# Patient Record
Sex: Female | Born: 1940 | Race: White | Hispanic: No | Smoking: Former smoker
Health system: Southern US, Community
[De-identification: ages and names within clinical notes are randomized; demographics above are authoritative.]

## PROBLEM LIST (undated history)

## (undated) DIAGNOSIS — K5790 Diverticulosis of intestine, part unspecified, without perforation or abscess without bleeding: Secondary | ICD-10-CM

## (undated) DIAGNOSIS — M1990 Unspecified osteoarthritis, unspecified site: Secondary | ICD-10-CM

## (undated) DIAGNOSIS — K449 Diaphragmatic hernia without obstruction or gangrene: Secondary | ICD-10-CM

## (undated) DIAGNOSIS — E119 Type 2 diabetes mellitus without complications: Secondary | ICD-10-CM

## (undated) DIAGNOSIS — E079 Disorder of thyroid, unspecified: Secondary | ICD-10-CM

## (undated) DIAGNOSIS — I2510 Atherosclerotic heart disease of native coronary artery without angina pectoris: Secondary | ICD-10-CM

## (undated) DIAGNOSIS — Z9289 Personal history of other medical treatment: Secondary | ICD-10-CM

## (undated) DIAGNOSIS — I4891 Unspecified atrial fibrillation: Secondary | ICD-10-CM

## (undated) DIAGNOSIS — R0602 Shortness of breath: Secondary | ICD-10-CM

## (undated) DIAGNOSIS — I509 Heart failure, unspecified: Secondary | ICD-10-CM

## (undated) DIAGNOSIS — I249 Acute ischemic heart disease, unspecified: Secondary | ICD-10-CM

## (undated) DIAGNOSIS — I10 Essential (primary) hypertension: Secondary | ICD-10-CM

## (undated) DIAGNOSIS — E7800 Pure hypercholesterolemia, unspecified: Secondary | ICD-10-CM

## (undated) DIAGNOSIS — F419 Anxiety disorder, unspecified: Secondary | ICD-10-CM

## (undated) DIAGNOSIS — E039 Hypothyroidism, unspecified: Secondary | ICD-10-CM

## (undated) DIAGNOSIS — D649 Anemia, unspecified: Secondary | ICD-10-CM

## (undated) HISTORY — DX: Acute ischemic heart disease, unspecified: I24.9

## (undated) HISTORY — PX: APPENDECTOMY: SHX54

## (undated) HISTORY — PX: ABDOMINAL HYSTERECTOMY: SHX81

## (undated) HISTORY — DX: Pure hypercholesterolemia, unspecified: E78.00

## (undated) HISTORY — DX: Diverticulosis of intestine, part unspecified, without perforation or abscess without bleeding: K57.90

## (undated) HISTORY — PX: LAPAROSCOPIC CHOLECYSTECTOMY: SUR755

## (undated) HISTORY — DX: Diaphragmatic hernia without obstruction or gangrene: K44.9

---

## 1998-02-17 ENCOUNTER — Emergency Department (HOSPITAL_COMMUNITY): Admission: EM | Admit: 1998-02-17 | Discharge: 1998-02-17 | Payer: Self-pay | Admitting: Emergency Medicine

## 1999-05-12 ENCOUNTER — Inpatient Hospital Stay (HOSPITAL_COMMUNITY): Admission: AD | Admit: 1999-05-12 | Discharge: 1999-05-13 | Payer: Self-pay | Admitting: Cardiovascular Disease

## 1999-08-12 ENCOUNTER — Ambulatory Visit (HOSPITAL_COMMUNITY): Admission: RE | Admit: 1999-08-12 | Discharge: 1999-08-12 | Payer: Self-pay | Admitting: Gastroenterology

## 1999-08-29 ENCOUNTER — Encounter: Payer: Self-pay | Admitting: Gastroenterology

## 1999-08-29 ENCOUNTER — Ambulatory Visit (HOSPITAL_COMMUNITY): Admission: RE | Admit: 1999-08-29 | Discharge: 1999-08-29 | Payer: Self-pay | Admitting: Gastroenterology

## 1999-09-05 ENCOUNTER — Encounter: Payer: Self-pay | Admitting: Gastroenterology

## 1999-09-05 ENCOUNTER — Ambulatory Visit (HOSPITAL_COMMUNITY): Admission: RE | Admit: 1999-09-05 | Discharge: 1999-09-05 | Payer: Self-pay | Admitting: Gastroenterology

## 1999-09-27 ENCOUNTER — Other Ambulatory Visit: Admission: RE | Admit: 1999-09-27 | Discharge: 1999-09-27 | Payer: Self-pay | Admitting: *Deleted

## 2000-02-13 ENCOUNTER — Ambulatory Visit (HOSPITAL_COMMUNITY): Admission: RE | Admit: 2000-02-13 | Discharge: 2000-02-13 | Payer: Self-pay | Admitting: Cardiovascular Disease

## 2000-02-13 ENCOUNTER — Encounter: Payer: Self-pay | Admitting: Cardiovascular Disease

## 2000-02-24 ENCOUNTER — Ambulatory Visit (HOSPITAL_COMMUNITY): Admission: RE | Admit: 2000-02-24 | Discharge: 2000-02-24 | Payer: Self-pay | Admitting: Cardiovascular Disease

## 2000-09-28 ENCOUNTER — Other Ambulatory Visit: Admission: RE | Admit: 2000-09-28 | Discharge: 2000-09-28 | Payer: Self-pay | Admitting: *Deleted

## 2003-08-01 ENCOUNTER — Emergency Department (HOSPITAL_COMMUNITY): Admission: EM | Admit: 2003-08-01 | Discharge: 2003-08-01 | Payer: Self-pay

## 2003-08-01 ENCOUNTER — Encounter: Payer: Self-pay | Admitting: Emergency Medicine

## 2004-10-17 ENCOUNTER — Ambulatory Visit: Payer: Self-pay | Admitting: Adult Health

## 2004-11-20 ENCOUNTER — Encounter: Admission: RE | Admit: 2004-11-20 | Discharge: 2004-11-20 | Payer: Self-pay | Admitting: Cardiovascular Disease

## 2005-04-24 ENCOUNTER — Ambulatory Visit: Payer: Self-pay | Admitting: Pulmonary Disease

## 2005-05-07 ENCOUNTER — Encounter: Admission: RE | Admit: 2005-05-07 | Discharge: 2005-05-07 | Payer: Self-pay | Admitting: Cardiovascular Disease

## 2005-05-21 ENCOUNTER — Ambulatory Visit: Payer: Self-pay | Admitting: Pulmonary Disease

## 2005-05-22 ENCOUNTER — Ambulatory Visit: Payer: Self-pay | Admitting: Internal Medicine

## 2005-06-18 ENCOUNTER — Ambulatory Visit: Payer: Self-pay | Admitting: Pulmonary Disease

## 2005-07-03 ENCOUNTER — Ambulatory Visit (HOSPITAL_COMMUNITY): Admission: RE | Admit: 2005-07-03 | Discharge: 2005-07-03 | Payer: Self-pay | Admitting: Pulmonary Disease

## 2005-07-16 ENCOUNTER — Inpatient Hospital Stay (HOSPITAL_COMMUNITY): Admission: AD | Admit: 2005-07-16 | Discharge: 2005-07-24 | Payer: Self-pay | Admitting: Cardiovascular Disease

## 2005-07-17 ENCOUNTER — Encounter (INDEPENDENT_AMBULATORY_CARE_PROVIDER_SITE_OTHER): Payer: Self-pay | Admitting: Cardiovascular Disease

## 2005-07-21 ENCOUNTER — Ambulatory Visit: Payer: Self-pay | Admitting: Gastroenterology

## 2005-07-22 ENCOUNTER — Encounter (INDEPENDENT_AMBULATORY_CARE_PROVIDER_SITE_OTHER): Payer: Self-pay | Admitting: Cardiovascular Disease

## 2005-08-11 ENCOUNTER — Ambulatory Visit: Payer: Self-pay | Admitting: Pulmonary Disease

## 2005-09-11 ENCOUNTER — Ambulatory Visit: Payer: Self-pay | Admitting: Gastroenterology

## 2005-10-01 ENCOUNTER — Ambulatory Visit: Payer: Self-pay | Admitting: Gastroenterology

## 2005-10-22 ENCOUNTER — Ambulatory Visit: Payer: Self-pay | Admitting: Gastroenterology

## 2005-10-22 ENCOUNTER — Encounter (INDEPENDENT_AMBULATORY_CARE_PROVIDER_SITE_OTHER): Payer: Self-pay | Admitting: *Deleted

## 2005-10-22 DIAGNOSIS — K449 Diaphragmatic hernia without obstruction or gangrene: Secondary | ICD-10-CM

## 2005-10-22 DIAGNOSIS — K5730 Diverticulosis of large intestine without perforation or abscess without bleeding: Secondary | ICD-10-CM | POA: Insufficient documentation

## 2005-10-22 DIAGNOSIS — K5790 Diverticulosis of intestine, part unspecified, without perforation or abscess without bleeding: Secondary | ICD-10-CM

## 2005-10-22 HISTORY — DX: Diaphragmatic hernia without obstruction or gangrene: K44.9

## 2005-10-22 HISTORY — DX: Diverticulosis of intestine, part unspecified, without perforation or abscess without bleeding: K57.90

## 2005-12-09 ENCOUNTER — Ambulatory Visit: Payer: Self-pay | Admitting: Pulmonary Disease

## 2006-09-08 IMAGING — CR DG KNEE COMPLETE 4+V*L*
4 series · 4 of 4 positions shown · non-contrast
Comparison: none

CLINICAL DATA: Fall.  Left knee trauma and pain.  
LEFT KNEE - 4 VIEW:
There is no evidence of fracture, dislocation, or joint effusion.  There is no evidence of arthropathy or other focal bone abnormality.  Soft tissues are unremarkable.

[t knee ap left (1 of 3)]
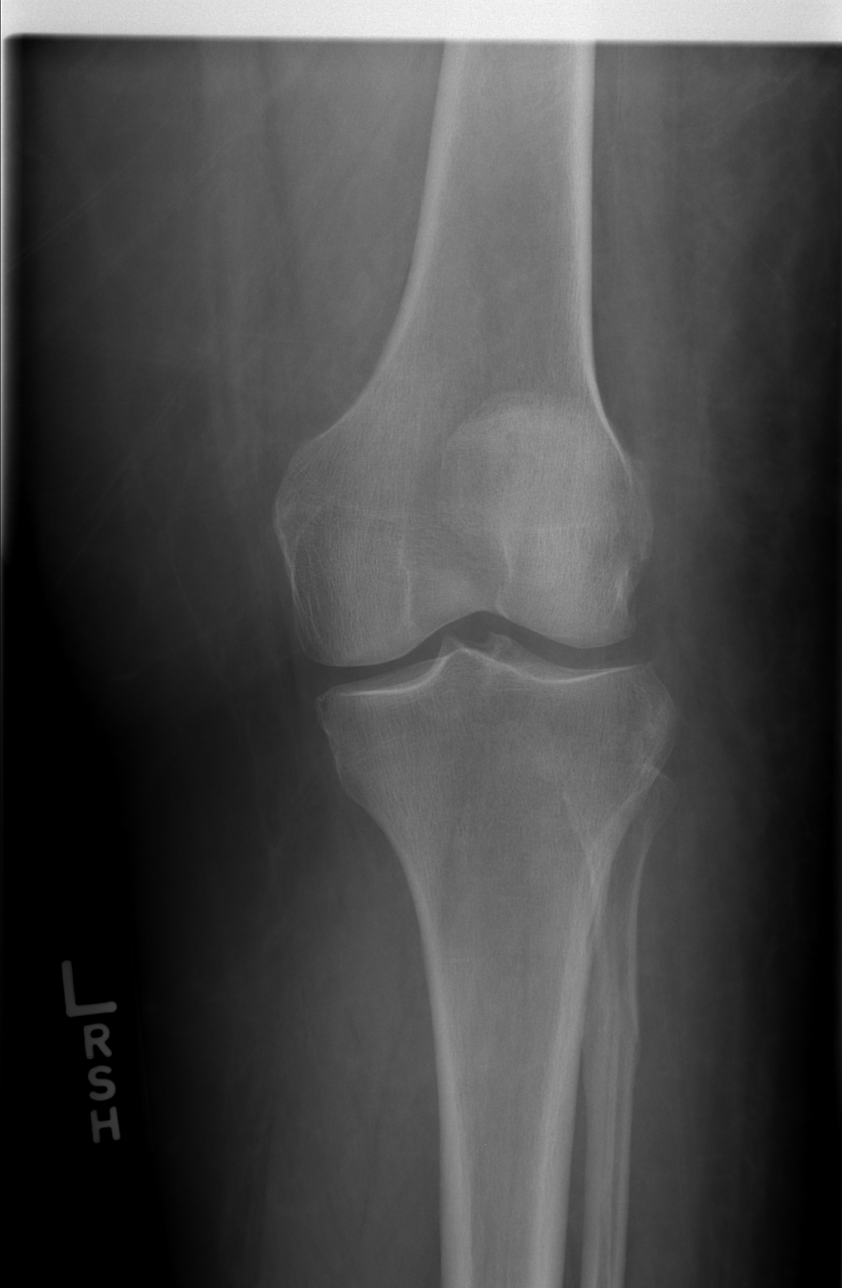

[t knee ap left (2 of 3)]
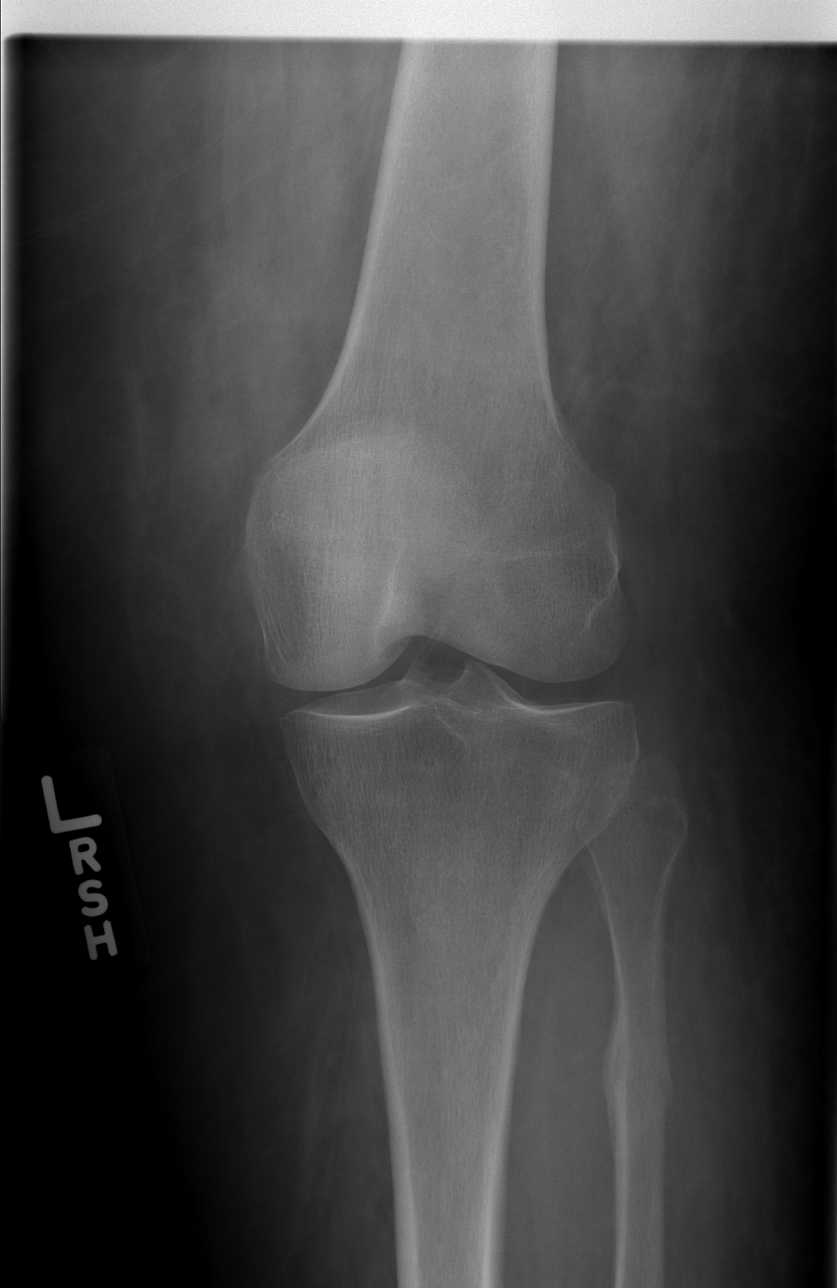

[t knee ap left (3 of 3)]
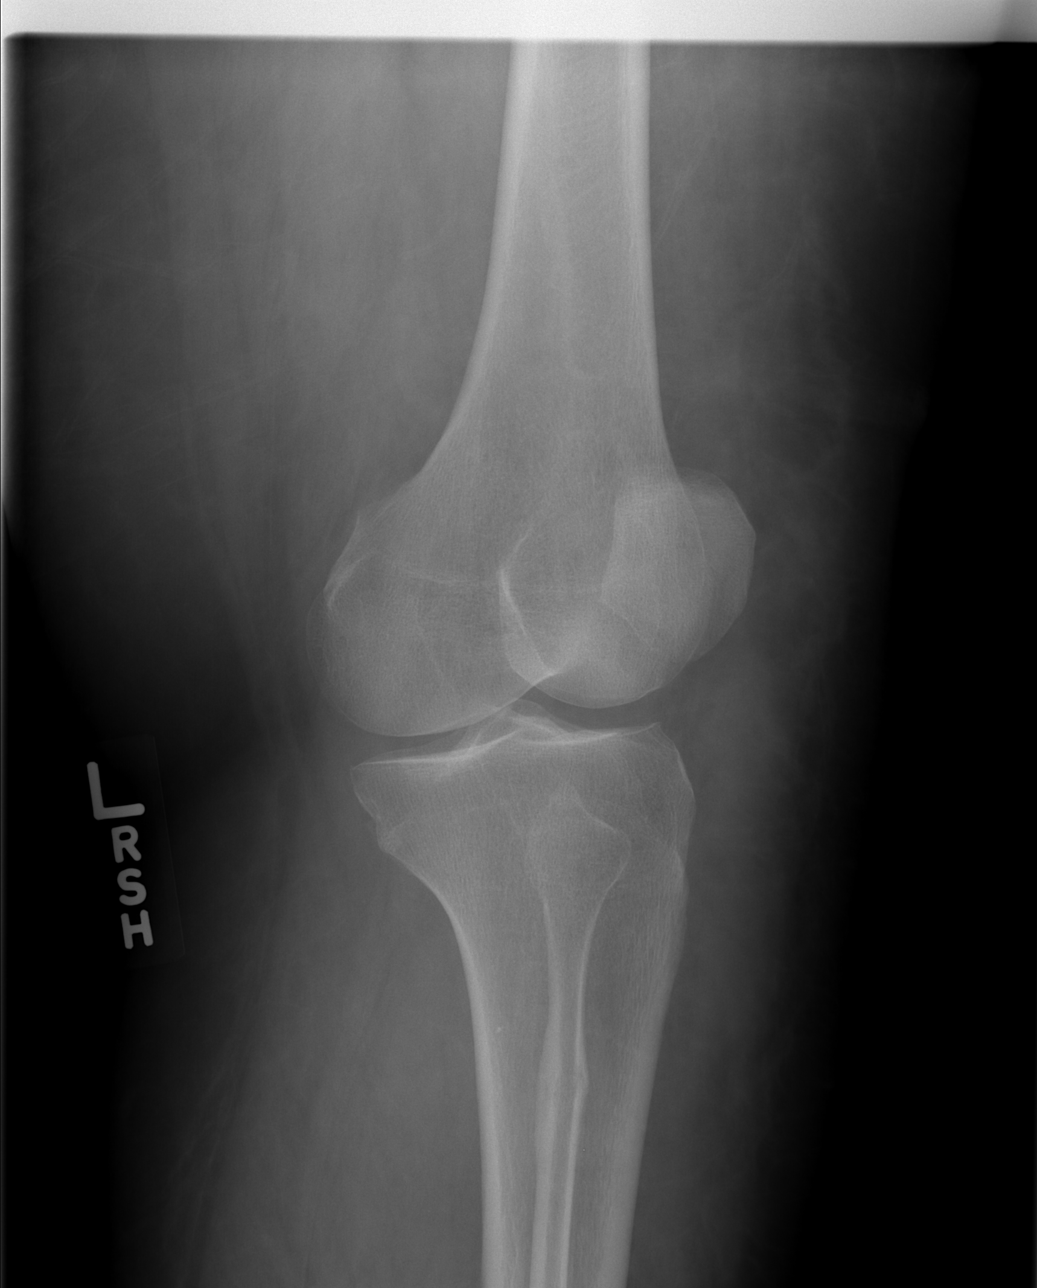

[t knee lat left]
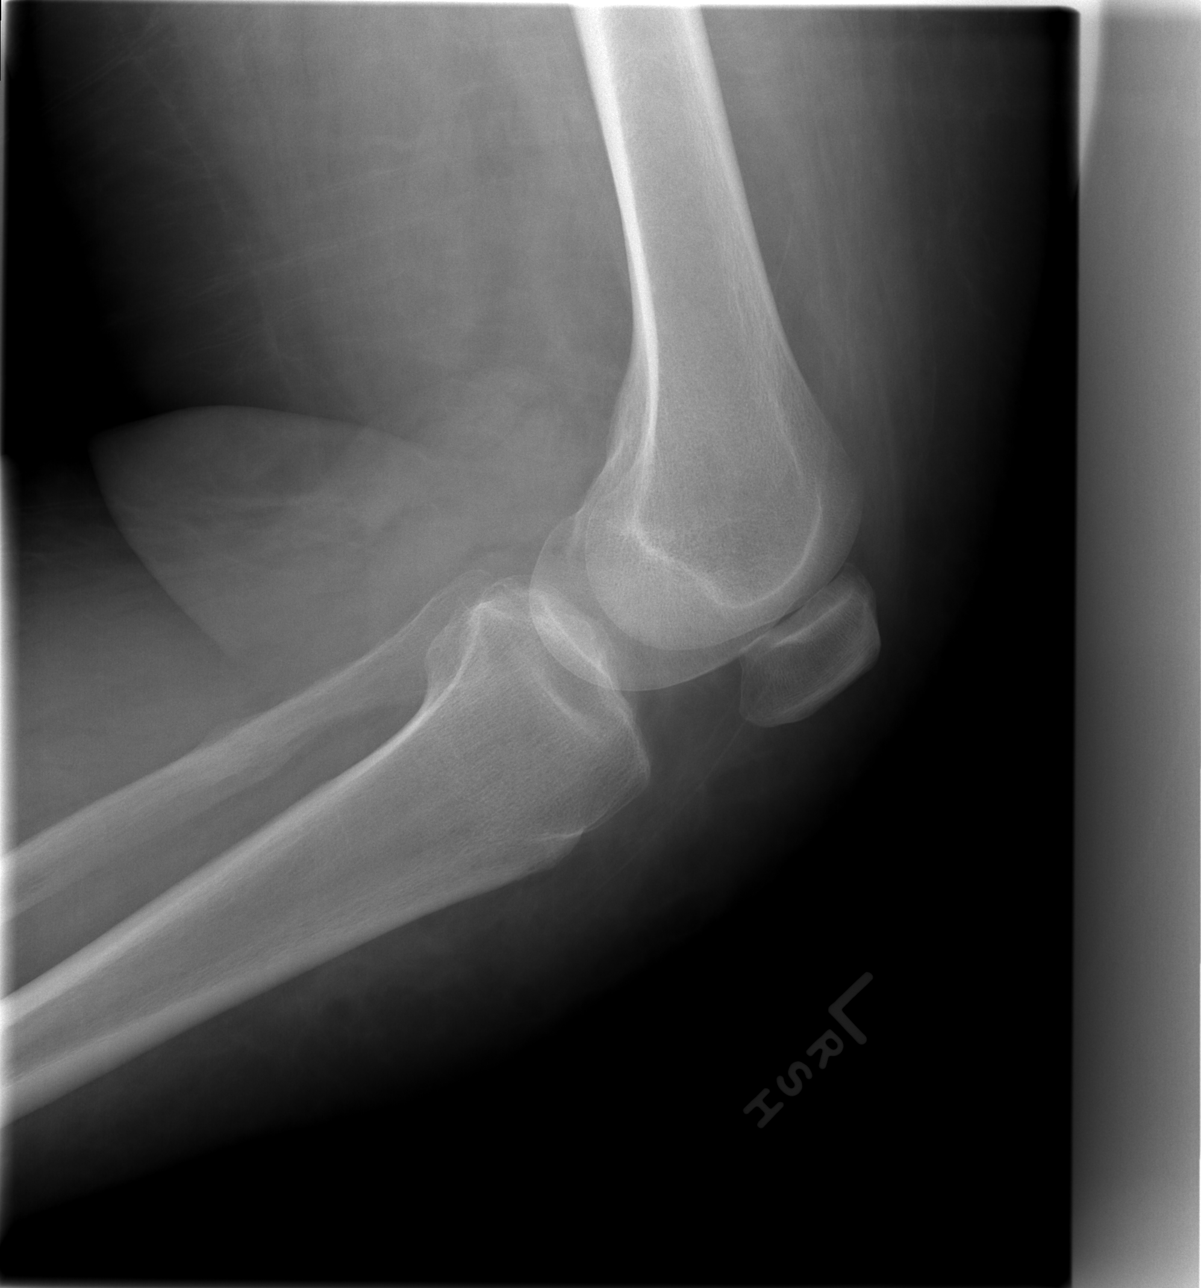

[4 of 4 positions shown; findings below may reference images not displayed]

IMPRESSION: Negative.

## 2008-03-09 ENCOUNTER — Ambulatory Visit: Payer: Self-pay | Admitting: Gastroenterology

## 2008-03-09 DIAGNOSIS — E7800 Pure hypercholesterolemia, unspecified: Secondary | ICD-10-CM

## 2008-03-09 DIAGNOSIS — E739 Lactose intolerance, unspecified: Secondary | ICD-10-CM

## 2008-03-09 DIAGNOSIS — E079 Disorder of thyroid, unspecified: Secondary | ICD-10-CM | POA: Insufficient documentation

## 2008-03-09 DIAGNOSIS — I2510 Atherosclerotic heart disease of native coronary artery without angina pectoris: Secondary | ICD-10-CM | POA: Insufficient documentation

## 2008-03-09 DIAGNOSIS — F411 Generalized anxiety disorder: Secondary | ICD-10-CM

## 2008-03-09 DIAGNOSIS — I10 Essential (primary) hypertension: Secondary | ICD-10-CM | POA: Insufficient documentation

## 2008-03-09 HISTORY — DX: Pure hypercholesterolemia, unspecified: E78.00

## 2008-03-20 ENCOUNTER — Ambulatory Visit: Payer: Self-pay | Admitting: Gastroenterology

## 2008-03-20 ENCOUNTER — Encounter: Payer: Self-pay | Admitting: Pulmonary Disease

## 2010-12-08 ENCOUNTER — Encounter: Payer: Self-pay | Admitting: Obstetrics and Gynecology

## 2011-04-04 NOTE — Cardiovascular Report (Signed)
[place]. [facility]  Patient:    Tracey [name], Tracey [name]                        MRN: [id number] Proc. Date: 02/24/00 Attending:  Ricki Rodriguez, M.D.                        Cardiac Catheterization  PROCEDURE DONE BY:  Ricki Rodriguez, M.D.  CINE NO. [id number]  PROCEDURES:  Left heart catheterization, selective coronary angiography, left ventricular function study.  INDICATIONS:  A 70 year old white female with atypical chest pain has positive ischemia on nuclear stress test along with a known of coronary artery disease and family history of premature coronary artery disease.  COMPLICATIONS:  None.  APPROACH:  Right femoral artery using 6-French diagnostic catheters.  HEMODYNAMIC DATA:  The left ventricular pressure was 169/12 mmHg, and the aortic pressure was 175/84 mmHg.  ANGIOGRAPHIC DATA:  The left ventriculogram showed normal left ventricular systolic function with an ejection fraction of 75%.  The aortogram was essentially unremarkable.  Coronary anatomy: 1. The left main coronary artery was normal. 2. The left anterior descending coronary artery was also normal. 3. The left circumflex coronary artery had a distal 40% stenosis and a    normal obtuse marginal branch #1 and #2. 4. The right coronary artery was dominant and unremarkable.  IMPRESSION: 1. Mild one-vessel coronary artery disease. 2. Normal left ventricular systolic function.  RECOMMENDATION:  This patient will be treated medically. DD:  02/24/00 TD:  02/24/00 Job: [id number]

## 2011-04-04 NOTE — H&P (Signed)
Tracey [name], Tracey [name]               ACCOUNT NO.:  [id number]   MEDICAL RECORD NO.:  [id number]          PATIENT TYPE:  INP   LOCATION:  4713                         FACILITY:  [facility]   PHYSICIAN:  Ricki Rodriguez, M.D.  DATE OF BIRTH:  1941-10-13   DATE OF ADMISSION:  07/16/2005  DATE OF DISCHARGE:                                HISTORY & PHYSICAL   CHIEF COMPLAINT:  Shortness of breath and leg swelling.   HISTORY OF PRESENT ILLNESS:  This is a 70 year old white female who  complained of two-day history of shortness of breath and gaining 6 pounds of  weight in 2-4 weeks.  Also, admitted to some occasional palpitations without  chest pain and she claims Lasix has not been working.   PAST MEDICAL HISTORY:  Positive for diabetes.  Positive for hypertension.  Negative for smoking.  Negative for alcohol intake.  Positive for elevated  cholesterol level, obesity and family history of premature coronary artery  disease.  Negative for myocardial infarction and exercise.   PAST SURGICAL HISTORY:  Patient had gallbladder surgery in June of 1995.  D&C in October of 1998 followed by complete hysterectomy in November of  1998.  Cardiac catheterization in 1994 and in 2000.   CURRENT MEDICATIONS:  1.  Xanax 0.5 mg at bedtime.  2.  calcium 600 mg one daily.  3.  multivitamin one daily.  4.  Actos 15 mg one daily.  5.  Diclofenac 75 mg one daily.  6.  Norvasc 5 mg one daily.  7.  Lescol 80 mg one daily changed to lovastatin 80 mg one daily.  8.  Lopressor 25 mg one daily.  9.  Reglan 10 mg one daily.  10. Prilosec 20 mg one daily.  11. Lasix 20 mg on Monday, Wednesday and Friday.  12. Potassium 10 mEq on Monday, Wednesday and Friday.  13. Meclizine 25 mg one three times daily as needed for dizziness.  14. Albuterol meter-dose inhaler two puffs four times daily as needed.  15. Claritin 10 mg one daily.  16. Lexapro 10 mg half daily.   ALLERGIES:  INCLUDE BENICAR, ALEVE GIVING HEARTBURN,  ZOCOR AND LIPITOR  GIVING MUSCLE PAINS.   PERSONAL HISTORY:  The patient is widowed.  Husband, Dorma Russell, died at age 44  of lung cancer.  She has two sons, 88 and 28 years old.  She works at [employer].   FAMILY HISTORY:  Mother living, age 60, with diabetes and coronary artery  disease.  Father died at age 69 of pneumonia.  She has two brothers, one  living and one died at age 22 of myocardial infarction.  She has two  sisters, both of whom are living and well.   REVIEW OF SYSTEMS:  The patient admits to weight gain.  Has vision change  with need to wear reading glasses.  No cataract surgery.  No hearing loss.  No tinnitus.  Positive full upper and lower dentures.  No cough, hemoptysis,  asthma, COPD or pneumonia.  Positive history of palpitations, chest pain and  leg edema.  No history of  nausea, vomiting, diarrhea, constipation, bleeding  from the bowels, cancer, hiatal hernia or hepatitis.  Positive history of  blood transfusion.  No history of kidney stone, hematuria, stroke, seizures,  psychiatric admissions.  Positive history of joint pains.   IMMUNIZATIONS:  Up-to-date on pneumonia shot two years ago.  Does not take  flu shot.   PHYSICAL EXAMINATION:  Pulse 110, respirations 16, blood pressure 130/80,  height 5 feet 4 inches, weight 268 pounds.  The patient was alert and  oriented times three.  HEENT:  The patient is normocephalic, atraumatic.  Wears glasses.  Has light  brown eyes.  Pupils equal and reacting to light.  Wears upper and lower  dentures.  NECK:  No JVD, no carotid bruit, full range of motion.  Neck is nontender  without lymphadenopathy and no thyromegaly.  LUNGS:  Bilateral basal crackles.  HEART:  Normal S1, S2, with grade 2/6 systolic murmur.  ABDOMEN:  Soft, nontender, distended with scars of surgery.  EXTREMITIES:  2+ edema, no cyanosis or clubbing.  Spider veins bilaterally  with right knee swelling and tenderness bilaterally.  CNS:   Cranial nerves grossly intact.  Bilateral equal grips.  The patient is  right-handed.   LABORATORY DATA:  Revealed hemoglobin of 10.6, hematocrit of 33.6, normal  WBC count and platelet count.  Normal PT, INR and PTT.  Near-normal  electrolytes save for potassium at 3.4.  Sugar borderline at 134.  Liver  enzymes normal.  Albumin low at 3.2.  Cardiac enzymes:  CK, MB, troponin I  normal.  B-type natruretic peptide positive at 320.   PLAN:  Admit the patient to the hospital.  Give IV Lasix and rule out  myocardial infarction.  The patient may undergo second echocardiographic  evaluation and cardiac catheterization as needed.      Ricki Rodriguez, M.D.  Electronically Signed     ASK/MEDQ  D:  07/17/2005  T:  07/17/2005  Job:  [id number]

## 2011-04-04 NOTE — Discharge Summary (Signed)
Tracey [name], Tracey [name]               ACCOUNT NO.:  [id number]   MEDICAL RECORD NO.:  [id number]          PATIENT TYPE:  INP   LOCATION:  4713                         FACILITY:  [facility]   PHYSICIAN:  Ricki Rodriguez, M.D.  DATE OF BIRTH:  22-Dec-1940   DATE OF ADMISSION:  07/16/2005  DATE OF DISCHARGE:  07/24/2005                                 DISCHARGE SUMMARY   PRINCIPAL DIAGNOSES:  1.  Congestive heart failure.  2.  Atrial fibrillation.  3.  Mitral valve disorder.  4.  Diabetes mellitus, type 2.  5.  Hyperlipidemia.  6.  Hypertension.  7.  Obesity.  8.  Gastroesophageal reflux disease.  9.  Family history of ischemic heart disease.   PRINCIPAL PROCEDURE:  Left heart catheterization and selective coronary  angioplasty done by Dr. Orpah Cobb on July 18, 2005.   DISCHARGE MEDICATIONS:  1.  Lovastatin 40 mg one p.o. daily.  2.  Lexapro 10 mg one p.o. daily.  3.  Reglan 10 mg one twice daily.  4.  Prilosec 20 mg two daily.  5.  Metoprolol 25 mg one twice daily.  6.  Lasix 40 mg one daily.  7.  Synthroid 25 mcg one daily.  8.  Lanoxin 0.25 mg one daily.  9.  Coumadin 5 mg daily.  10. Ferrous sulfate 325 mg daily.  11. Altace 2.5 mg daily.  12. Lovenox 120 mg subcutaneously twice daily.  13. Diltiazem 30 mg three times daily.  14. Xanax 0.25 mg one twice daily.  15. Glucophage 500 mg twice daily.  16. KCL 10 mEq two twice daily.  17. Darvocet-N 100 one twice daily.   CONDITION ON DISCHARGE:  Improved.   DISCHARGE ACTIVITIES:  As tolerated.   DISCHARGE DIET:  Low-fat, low-salt, diabetic diet.  The patient to avoid  green leafy vegetables.   SPECIAL INSTRUCTIONS:  The patient to have prothrombin time checked in five  days and then once a month.   HISTORY:  This is a 70 year old white female, who presented with shortness  of breath gaining six pounds of weight of a two to four-weeks period with  occasional palpitation of the heart without chest pain.   PHYSICAL  EXAMINATION:  VITAL SIGNS:  Pulse 110, respirations 16, blood  pressure 130/80, height 5 feet 4 inches, weight 268 pounds.  GENERAL:  The patient was alert and oriented x3.  HEENT:  The patient is normocephalic, atraumatic, wears glasses, has light  brown eyes.  Pupils equal, round, reactive to light.  There is upper and  lower dentures.  NECK:  No JVD, no carotid bruit.  Full range of motion.  Neck is nontender  without lymphadenopathy and no thyromegaly.  LUNGS:  Bilateral basilar crackles.  HEART:  Normal S1, S2 with grade 2/6 systolic murmur.  ABDOMEN:  Soft, non-tender, distended with scar of surgery.  EXTREMITIES:  2+ edema, no cyanosis or clubbing, spider veins bilaterally  with right knee swelling and tenderness.  CENTRAL NERVOUS SYSTEM:  Cranial nerves grossly intact.  Bilateral ankle  bruits and patient is right-handed.   LABORATORY DATA:  Hemoglobin 10.6,  hematocrit 33.6, normal WBC count,  platelet count.  Normal PT/INR, near normal electrolytes except a potassium  of 3.4.  Sugar borderline at 134.  Liver enzymes normal.  Albumin low at  3.2.  Cardiac enzymes normal.  B-natriuretic peptide positive at 320.  Iron  level low at 25 with 5% saturation.  Cholesterol 149, HDL cholesterol low at  30, LDL cholesterol 100.  X-ray of the left knee negative for fracture.  X-  ray of the chest, cardiomegaly with a minimal bibasilar atelectasis.  EKG  revealed atrial fibrillation with normal ventricular response and non-  specific T-wave abnormality.  Echocardiogram showed a mild LV systolic  dysfunction with a moderate hypokinesia of anteroseptal wall and a moderate  mitral valve regurgitation and small mobile thrombi in the left ventricular  appendage, and also small mobile thrombus in the right atrial free wall.   Cardiac catheterization showed minimal multi-vessel coronary artery disease  with 40 to 50% ejection fraction by ultrasound of the heart.   HOSPITAL COURSE:  The patient  was admitted to telemetry unit and myocardial  infarction was ruled out.  She ruled in for mild congestive heart failure,  and she underwent cardiac catheterization that failed to show significant  coronary artery disease.  Left ventricular ejection fraction was 40%, and  she underwent transesophageal echocardiogram, along with need to find a  source of clot in the left atrial appendage prior to possible cardioversion.  She had an ultrasound of the heart showing a small thrombi in the left  ventricular appendage as well as right atrial free wall.  Hence,  cardioversion was not done.   She had a Gastrointestinal consult due to black stools, along with history  of gastroparesis and GERD.  She underwent upper gastrointestinal and lower  gastrointestinal scope showing gastroesophageal reflux disease, and colonic  diverticulosis without polyps.  Her iron deficiency anemia was corrected  with iron supplement.  Her overall  condition remained stable except for her left knee hematoma following  episode of a fall.  X-ray was negative for any fracture and on July 24, 2005 she was discharged home in satisfactory condition with follow-up by me  in one to two weeks.      Ricki Rodriguez, M.D.  Electronically Signed     ASK/MEDQ  D:  10/16/2005  T:  10/16/2005  Job:  [id number]

## 2011-04-04 NOTE — Cardiovascular Report (Signed)
Tracey [name], Tracey [name]               ACCOUNT NO.:  [id number]   MEDICAL RECORD NO.:  [id number]          PATIENT TYPE:  INP   LOCATION:  4713                         FACILITY:  [facility]   PHYSICIAN:  Ricki Rodriguez, M.D.  DATE OF BIRTH:  [birth year]   DATE OF PROCEDURE:  07/18/2005  DATE OF DISCHARGE:                              CARDIAC CATHETERIZATION   PROCEDURE:  1.  Left heart catheterization.  2.  Selective coronary angiography.   INDICATION:  This 70 year old white female presented with congestive heart  failure, had abnormal electrocardiogram along with cardiac risk factors of  hypertension, diabetes, hyperlipidemia and obesity.   APPROACH:  Right femoral artery using 4-French sheath and catheters.  SMART  needle was used for vascular access.   COMPLICATIONS:  None.   HEMODYNAMIC DATA:  The left ventricular pressure was 129/27 and aortic  pressure was 125/78.   CORONARY ANATOMY:  The left main coronary artery showed luminal  irregularities, otherwise was unremarkable.   Left anterior descending coronary artery:  The left anterior descending  coronary artery also showed luminal irregularities in the proximal portion  and its diagonal 1, 2 and 3 vessels were unremarkable.   Left circumflex coronary artery:  The left circumflex coronary artery was a  large vessel and obtuse marginal branch 1, 2 and 3 were unremarkable.   Right coronary artery:  The right coronary artery was dominant and had  proximal half luminal irregularities.  Its posterolateral and posterior  descending coronary artery were unremarkable.   Ejection fraction was 40% by ultrasound of the heart and used less than 40  mL of the dye for the entire procedure.   IMPRESSION:  Minimal multivessel coronary artery disease.   This patient will continue medical therapy with the use of aspirin, beta  blocker, Cardizem, Lanoxin as needed.      Ricki Rodriguez, M.D.  Electronically Signed    ASK/MEDQ  D:   07/18/2005  T:  07/18/2005  Job:  [id number]

## 2012-11-17 HISTORY — PX: CATARACT EXTRACTION W/ INTRAOCULAR LENS  IMPLANT, BILATERAL: SHX1307

## 2013-01-06 ENCOUNTER — Ambulatory Visit
Admission: RE | Admit: 2013-01-06 | Discharge: 2013-01-06 | Disposition: A | Discharge status: Home / Self Care | Payer: Medicare Other | Source: Ambulatory Visit | Attending: Cardiovascular Disease | Admitting: Cardiovascular Disease

## 2013-01-06 ENCOUNTER — Other Ambulatory Visit: Payer: Self-pay | Admitting: Cardiovascular Disease

## 2013-01-06 DIAGNOSIS — R609 Edema, unspecified: Secondary | ICD-10-CM

## 2013-09-15 ENCOUNTER — Emergency Department (HOSPITAL_COMMUNITY): Payer: Medicare Other

## 2013-09-15 ENCOUNTER — Encounter (HOSPITAL_COMMUNITY): Payer: Self-pay | Admitting: Emergency Medicine

## 2013-09-15 ENCOUNTER — Inpatient Hospital Stay (HOSPITAL_COMMUNITY): Admission: AD | Admit: 2013-09-15 | Payer: Medicare Other | Source: Ambulatory Visit | Admitting: Cardiovascular Disease

## 2013-09-15 ENCOUNTER — Inpatient Hospital Stay (HOSPITAL_COMMUNITY)
Admission: EM | Admit: 2013-09-15 | Discharge: 2013-09-24 | DRG: 287 | Disposition: A | Discharge status: Home / Self Care | Payer: Medicare Other | Attending: Cardiovascular Disease | Admitting: Cardiovascular Disease

## 2013-09-15 DIAGNOSIS — Z87891 Personal history of nicotine dependence: Secondary | ICD-10-CM

## 2013-09-15 DIAGNOSIS — Z6841 Body Mass Index (BMI) 40.0 and over, adult: Secondary | ICD-10-CM

## 2013-09-15 DIAGNOSIS — Z5309 Procedure and treatment not carried out because of other contraindication: Secondary | ICD-10-CM

## 2013-09-15 DIAGNOSIS — E785 Hyperlipidemia, unspecified: Secondary | ICD-10-CM | POA: Diagnosis present

## 2013-09-15 DIAGNOSIS — E873 Alkalosis: Secondary | ICD-10-CM | POA: Diagnosis present

## 2013-09-15 DIAGNOSIS — I2510 Atherosclerotic heart disease of native coronary artery without angina pectoris: Secondary | ICD-10-CM | POA: Diagnosis present

## 2013-09-15 DIAGNOSIS — I4891 Unspecified atrial fibrillation: Secondary | ICD-10-CM | POA: Diagnosis present

## 2013-09-15 DIAGNOSIS — D500 Iron deficiency anemia secondary to blood loss (chronic): Secondary | ICD-10-CM | POA: Diagnosis not present

## 2013-09-15 DIAGNOSIS — E039 Hypothyroidism, unspecified: Secondary | ICD-10-CM | POA: Diagnosis present

## 2013-09-15 DIAGNOSIS — I10 Essential (primary) hypertension: Secondary | ICD-10-CM | POA: Diagnosis present

## 2013-09-15 DIAGNOSIS — IMO0002 Reserved for concepts with insufficient information to code with codable children: Secondary | ICD-10-CM | POA: Diagnosis not present

## 2013-09-15 DIAGNOSIS — I5021 Acute systolic (congestive) heart failure: Principal | ICD-10-CM | POA: Diagnosis present

## 2013-09-15 DIAGNOSIS — I509 Heart failure, unspecified: Secondary | ICD-10-CM | POA: Diagnosis present

## 2013-09-15 DIAGNOSIS — E1169 Type 2 diabetes mellitus with other specified complication: Secondary | ICD-10-CM | POA: Diagnosis present

## 2013-09-15 DIAGNOSIS — T502X5A Adverse effect of carbonic-anhydrase inhibitors, benzothiadiazides and other diuretics, initial encounter: Secondary | ICD-10-CM | POA: Diagnosis present

## 2013-09-15 DIAGNOSIS — S301XXA Contusion of abdominal wall, initial encounter: Secondary | ICD-10-CM | POA: Diagnosis not present

## 2013-09-15 DIAGNOSIS — E7800 Pure hypercholesterolemia, unspecified: Secondary | ICD-10-CM | POA: Diagnosis present

## 2013-09-15 DIAGNOSIS — J45909 Unspecified asthma, uncomplicated: Secondary | ICD-10-CM | POA: Diagnosis present

## 2013-09-15 HISTORY — DX: Essential (primary) hypertension: I10

## 2013-09-15 HISTORY — DX: Unspecified atrial fibrillation: I48.91

## 2013-09-15 HISTORY — DX: Heart failure, unspecified: I50.9

## 2013-09-15 HISTORY — DX: Disorder of thyroid, unspecified: E07.9

## 2013-09-15 LAB — CBC
HCT: 37.5 % (ref 36.0–46.0)
Hemoglobin: 11.6 g/dL — ABNORMAL LOW (ref 12.0–15.0)
MCH: 26.6 pg (ref 26.0–34.0)
MCV: 86 fL (ref 78.0–100.0)
Platelets: 313 10*3/uL (ref 150–400)
RBC: 4.36 MIL/uL (ref 3.87–5.11)
WBC: 9.1 10*3/uL (ref 4.0–10.5)

## 2013-09-15 LAB — BASIC METABOLIC PANEL
BUN: 17 mg/dL (ref 6–23)
Creatinine, Ser: 0.64 mg/dL (ref 0.50–1.10)
Glucose, Bld: 90 mg/dL (ref 70–99)
Sodium: 140 mEq/L (ref 135–145)

## 2013-09-15 LAB — PRO B NATRIURETIC PEPTIDE: Pro B Natriuretic peptide (BNP): 1567 pg/mL — ABNORMAL HIGH (ref 0–125)

## 2013-09-15 LAB — POCT I-STAT TROPONIN I: Troponin i, poc: 0.01 ng/mL (ref 0.00–0.08)

## 2013-09-15 LAB — GLUCOSE, CAPILLARY: Glucose-Capillary: 101 mg/dL — ABNORMAL HIGH (ref 70–99)

## 2013-09-15 MED ORDER — SODIUM CHLORIDE 0.9 % IV SOLN: 250.0000 mL | INTRAVENOUS | Status: DC | PRN

## 2013-09-15 MED ORDER — DIGOXIN 250 MCG PO TABS
0.2500 mg | ORAL_TABLET | Freq: Every day | ORAL | Status: DC
  Administered 2013-09-15 – 2013-09-22 (×8): 0.25 mg via ORAL
  Filled 2013-09-15 (×10): qty 1

## 2013-09-15 MED ORDER — POTASSIUM CHLORIDE CRYS ER 20 MEQ PO TBCR
20.0000 meq | EXTENDED_RELEASE_TABLET | Freq: Two times a day (BID) | ORAL | Status: DC
  Administered 2013-09-15 – 2013-09-24 (×18): 20 meq via ORAL
  Filled 2013-09-15 (×24): qty 1

## 2013-09-15 MED ORDER — WARFARIN - PHYSICIAN DOSING INPATIENT
Freq: Every day | Status: DC
  Administered 2013-09-16 – 2013-09-20 (×3)

## 2013-09-15 MED ORDER — INSULIN GLARGINE 100 UNIT/ML ~~LOC~~ SOLN
6.0000 [IU] | Freq: Every day | SUBCUTANEOUS | Status: DC
  Administered 2013-09-15 – 2013-09-22 (×7): 6 [IU] via SUBCUTANEOUS
  Filled 2013-09-15 (×9): qty 0.06

## 2013-09-15 MED ORDER — WARFARIN SODIUM 7.5 MG PO TABS: 7.5000 mg | ORAL_TABLET | Freq: Every day | ORAL | Status: DC

## 2013-09-15 MED ORDER — METFORMIN HCL 500 MG PO TABS
1000.0000 mg | ORAL_TABLET | Freq: Two times a day (BID) | ORAL | Status: DC
  Administered 2013-09-16 (×2): 1000 mg via ORAL
  Filled 2013-09-15 (×5): qty 2

## 2013-09-15 MED ORDER — FUROSEMIDE 10 MG/ML IJ SOLN
40.0000 mg | Freq: Once | INTRAMUSCULAR | Status: AC
  Administered 2013-09-15: 40 mg via INTRAVENOUS
  Filled 2013-09-15: qty 4

## 2013-09-15 MED ORDER — METOPROLOL SUCCINATE ER 25 MG PO TB24
25.0000 mg | ORAL_TABLET | Freq: Every evening | ORAL | Status: DC
  Administered 2013-09-15 – 2013-09-22 (×8): 25 mg via ORAL
  Filled 2013-09-15 (×10): qty 1

## 2013-09-15 MED ORDER — ESCITALOPRAM OXALATE 20 MG PO TABS
20.0000 mg | ORAL_TABLET | Freq: Every day | ORAL | Status: DC
  Administered 2013-09-16 – 2013-09-24 (×9): 20 mg via ORAL
  Filled 2013-09-15 (×10): qty 1

## 2013-09-15 MED ORDER — INSULIN ASPART 100 UNIT/ML ~~LOC~~ SOLN
0.0000 [IU] | Freq: Three times a day (TID) | SUBCUTANEOUS | Status: DC
  Administered 2013-09-17 (×2): 2 [IU] via SUBCUTANEOUS
  Administered 2013-09-18: 3 [IU] via SUBCUTANEOUS
  Administered 2013-09-18 – 2013-09-19 (×3): 2 [IU] via SUBCUTANEOUS
  Administered 2013-09-19: 3 [IU] via SUBCUTANEOUS
  Administered 2013-09-19: 12:00:00 5 [IU] via SUBCUTANEOUS
  Administered 2013-09-21 – 2013-09-22 (×4): 3 [IU] via SUBCUTANEOUS
  Administered 2013-09-22: 11 [IU] via SUBCUTANEOUS
  Administered 2013-09-23 (×2): 5 [IU] via SUBCUTANEOUS
  Administered 2013-09-23: 8 [IU] via SUBCUTANEOUS
  Administered 2013-09-24: 08:00:00 3 [IU] via SUBCUTANEOUS

## 2013-09-15 MED ORDER — ACETAMINOPHEN 325 MG PO TABS: 650.0000 mg | ORAL_TABLET | ORAL | Status: DC | PRN

## 2013-09-15 MED ORDER — LEVOTHYROXINE SODIUM 50 MCG PO TABS
50.0000 ug | ORAL_TABLET | Freq: Every day | ORAL | Status: DC
  Administered 2013-09-16: 50 ug via ORAL
  Filled 2013-09-15 (×2): qty 1

## 2013-09-15 MED ORDER — ALPRAZOLAM 0.5 MG PO TABS
0.5000 mg | ORAL_TABLET | Freq: Two times a day (BID) | ORAL | Status: DC | PRN
  Administered 2013-09-15 – 2013-09-23 (×9): 0.5 mg via ORAL
  Filled 2013-09-15 (×9): qty 1

## 2013-09-15 MED ORDER — RAMIPRIL 5 MG PO CAPS
5.0000 mg | ORAL_CAPSULE | Freq: Every day | ORAL | Status: DC
  Administered 2013-09-16 – 2013-09-24 (×9): 5 mg via ORAL
  Filled 2013-09-15 (×11): qty 1

## 2013-09-15 MED ORDER — DILTIAZEM HCL 30 MG PO TABS
30.0000 mg | ORAL_TABLET | Freq: Two times a day (BID) | ORAL | Status: DC
  Administered 2013-09-15 – 2013-09-16 (×3): 30 mg via ORAL
  Filled 2013-09-15 (×5): qty 1

## 2013-09-15 MED ORDER — ONDANSETRON HCL 4 MG/2ML IJ SOLN: 4.0000 mg | Freq: Four times a day (QID) | INTRAMUSCULAR | Status: DC | PRN

## 2013-09-15 MED ORDER — FUROSEMIDE 10 MG/ML IJ SOLN
40.0000 mg | Freq: Two times a day (BID) | INTRAMUSCULAR | Status: DC
  Administered 2013-09-16 – 2013-09-23 (×13): 40 mg via INTRAVENOUS
  Filled 2013-09-15 (×21): qty 4

## 2013-09-15 MED ORDER — SODIUM CHLORIDE 0.9 % IJ SOLN
3.0000 mL | Freq: Two times a day (BID) | INTRAMUSCULAR | Status: DC
  Administered 2013-09-15 – 2013-09-24 (×16): 3 mL via INTRAVENOUS

## 2013-09-15 MED ORDER — GLIPIZIDE 10 MG PO TABS
10.0000 mg | ORAL_TABLET | Freq: Two times a day (BID) | ORAL | Status: DC
  Administered 2013-09-16 (×2): 10 mg via ORAL
  Filled 2013-09-15 (×5): qty 1

## 2013-09-15 MED ORDER — WARFARIN SODIUM 7.5 MG PO TABS
7.5000 mg | ORAL_TABLET | ORAL | Status: DC
  Administered 2013-09-16 – 2013-09-19 (×3): 7.5 mg via ORAL
  Filled 2013-09-15 (×3): qty 1

## 2013-09-15 MED ORDER — ADULT MULTIVITAMIN W/MINERALS CH
1.0000 | ORAL_TABLET | Freq: Every day | ORAL | Status: DC
  Administered 2013-09-16 – 2013-09-24 (×9): 1 via ORAL
  Filled 2013-09-15 (×10): qty 1

## 2013-09-15 MED ORDER — SODIUM CHLORIDE 0.9 % IJ SOLN: 3.0000 mL | INTRAMUSCULAR | Status: DC | PRN

## 2013-09-15 NOTE — ED Provider Notes (Signed)
  Face-to-face evaluation   History: Gradually worse, shortness of breath, and orthopnea for 2 weeks, with weight gain from 180 to 200 lbs.. She is taking her usual medication, without relief. She has chronic atrial fibrillation and is anticoagulated. She denies chest pain, nausea, vomiting, weakness, or dizziness.  Physical exam: Obese, alert, cooperative. She's not in respiratory distress. Extremities have bilateral pitting edema 2-3+. No apparent extremity cellulitis. Neurologic- moving all extremities equally, she is lucid.  Medical screening examination/treatment/procedure(s) were conducted as a shared visit with non-physician practitioner(s) and myself.  I personally evaluated the patient during the encounter  Flint Melter, MD 09/16/13 0010

## 2013-09-15 NOTE — ED Provider Notes (Signed)
CSN: [id number]     Arrival date & time 09/15/13  1342 History   First MD Initiated Contact with Patient 09/15/13 1509     No chief complaint on file.  (Consider location/radiation/quality/duration/timing/severity/associated sxs/prior Treatment) HPI Patient was seen this morning by her PCP Dr Ladon Applebaum, who sent her to the ED stating she would need to be admitted 2-3 days for CHF exacerbation. Pt states she has been SOB with a dry cough x 3 days, has had fluid buildup in her lower extremities x 2 weeks.  Denies fevers, chills, body aches, chest pain, URI symptoms.   Past Medical History  Diagnosis Date  . CHF (congestive heart failure)   . Atrial fibrillation   . Diabetes mellitus without complication   . Hypertension   . Thyroid disease    Past Surgical History  Procedure Laterality Date  . Eye surgery    . Abdominal hysterectomy     History reviewed. No pertinent family history. History  Substance Use Topics  . Smoking status: Former Games developer  . Smokeless tobacco: Not on file  . Alcohol Use: No   OB History   Grav Para Term Preterm Abortions TAB SAB Ect Mult Living                 Review of Systems  Constitutional: Negative for fever and chills.  HENT: Negative for sore throat.   Respiratory: Positive for cough and shortness of breath.   Cardiovascular: Negative for chest pain.  Gastrointestinal: Negative for nausea, vomiting, abdominal pain and diarrhea.  Genitourinary: Negative for dysuria, urgency and frequency.  Musculoskeletal: Negative for myalgias.  Skin: Negative for rash.  All other systems reviewed and are negative.    Allergies  Codeine  Home Medications   Current Outpatient Rx  Name  Route  Sig  Dispense  Refill  . ALPRAZolam (XANAX) 0.5 MG tablet   Oral   Take 0.5 mg by mouth 2 (two) times daily as needed for sleep or anxiety.         Marland Kitchen diltiazem (CARDIZEM) 30 MG tablet   Oral   Take 30 mg by mouth 2 (two) times daily.         Marland Kitchen  escitalopram (LEXAPRO) 20 MG tablet   Oral   Take 20 mg by mouth daily.         Marland Kitchen glipiZIDE (GLUCOTROL) 10 MG tablet   Oral   Take 10 mg by mouth 2 (two) times daily before a meal.         . levothyroxine (SYNTHROID, LEVOTHROID) 50 MCG tablet   Oral   Take 50 mcg by mouth daily before breakfast.         . metFORMIN (GLUCOPHAGE) 500 MG tablet   Oral   Take 1,000 mg by mouth 2 (two) times daily with a meal.         . metoprolol tartrate (LOPRESSOR) 25 MG tablet   Oral   Take 25 mg by mouth every evening.         . Multiple Vitamin (MULTIVITAMIN WITH MINERALS) TABS tablet   Oral   Take 1 tablet by mouth daily.         . ramipril (ALTACE) 5 MG capsule   Oral   Take 5 mg by mouth daily.         Marland Kitchen warfarin (COUMADIN) 7.5 MG tablet   Oral   Take 3.75-7.5 mg by mouth daily.          BP 132/96  Pulse 85  Temp(Src) 98.2 F (36.8 C) (Oral)  Resp 26  SpO2 94% Physical Exam  Nursing note and vitals reviewed. Constitutional: She appears well-developed and well-nourished. No distress.  HENT:  Head: Normocephalic and atraumatic.  Eyes: Conjunctivae are normal.  Neck: Neck supple.  Cardiovascular: Normal rate and regular rhythm.   Pulmonary/Chest: Effort normal. No respiratory distress. She has decreased breath sounds. She has wheezes. She has no rales.  Abdominal: Soft. She exhibits no distension. There is no tenderness. There is no rebound and no guarding.  Musculoskeletal: She exhibits edema.  Bilateral lower extremity pitting edema.  Distal pulses intact.  Right lateral knee with ecchymosis.   Neurological: She is alert.  Skin: She is not diaphoretic.    ED Course  Procedures (including critical care time) Labs Review Labs Reviewed  CBC - Abnormal; Notable for the following:    Hemoglobin 11.6 (*)    All other components within normal limits  BASIC METABOLIC PANEL - Abnormal; Notable for the following:    GFR calc non Af Amer 87 (*)    All other  components within normal limits  PRO B NATRIURETIC PEPTIDE - Abnormal; Notable for the following:    Pro B Natriuretic peptide (BNP) 1567.0 (*)    All other components within normal limits  PROTIME-INR - Abnormal; Notable for the following:    Prothrombin Time 33.3 (*)    INR 3.43 (*)    All other components within normal limits  POCT I-STAT TROPONIN I   Imaging Review Dg Chest Portable 1 View  09/15/2013   CLINICAL DATA:  Shortness of breath  EXAM: PORTABLE CHEST - 1 VIEW  COMPARISON:  Prior chest x-ray 07/16/2005  FINDINGS: Enlargement of the cardiopericardial silhouette with double density sign overlying the right atrium suggestive of the left atrial enlargement. Atherosclerotic calcifications noted in the transverse aorta. Negative for focal airspace consolidation, pleural effusion or pneumothorax. Pulmonary vascular congestion bordering on mild interstitial edema. Chronic central bronchitic changes are noted.  IMPRESSION: Cardiomegaly, left atrial enlargement and pulmonary vascular congestion bordering on mild interstitial edema suggest mild/early CHF.  Aortic atherosclerosis.   Electronically Signed   By: Malachy Moan M.D.   On: 09/15/2013 16:19    EKG Interpretation     Ventricular Rate:  87 PR Interval:    QRS Duration: 138 QT Interval:  430 QTC Calculation: 517 R Axis:   -24 Text Interpretation:  Atrial fibrillation Indeterminate axis Right bundle branch block Anterior infarct , age undetermined Abnormal ECG Since last tracing Right bundle branch block , new           Filed Vitals:   09/15/13 1412  BP: 132/96  Pulse: 85  Temp: 98.2 F (36.8 C)  Resp: 26     Date: 09/15/2013  Rate: 87  Rhythm: atrial fibrillation  QRS Axis: indeterminate  Intervals: normal  ST/T Wave abnormalities: nonspecific ST/T changes and Q waves, flipped T waves  Conduction Disutrbances:right bundle branch block  Narrative Interpretation:   Old EKG Reviewed: none available     MDM   1. CHF exacerbation    Pt with hx CHF p/w increased lower extremity edema and SOB.  Sent in to ED by Dr Ladon Applebaum for admission. Pt given lasix IV in ED.  4:45 PM I spoke with Dr Algie Coffer who states patient was supposed to be direct admission and he has been waiting for her to arrive for several hours.  He will come admit her from the ED.  [place], PA-C 09/15/13 1645

## 2013-09-15 NOTE — ED Notes (Signed)
Pt presents with 1 week h/o shortness of breath and BLE edema.  Pt reports dry cough, denies any chest discomfort.

## 2013-09-15 NOTE — H&P (Signed)
Tracey [name] is an 72 y.o. female.   Chief Complaint: Shortness of breath x 3 days and leg edema x 2 weeks HPI: 72 years old female has been SOB with a dry cough x 3 days, has had fluid buildup in her lower extremities x 2 weeks. Denies fever, chills, body aches, chest pain, URI symptoms.    Past Medical History  Diagnosis Date  . CHF (congestive heart failure)   . Atrial fibrillation   . Diabetes mellitus without complication   . Hypertension   . Thyroid disease       Past Surgical History  Procedure Laterality Date  . Eye surgery    . Abdominal hysterectomy    GB surgery-1995 Cardiac cath in 1994 and 2000.  History reviewed. No pertinent family history. Social History:  reports that she has quit smoking. She does not have any smokeless tobacco history on file. She reports that she does not drink alcohol or use illicit drugs.  Allergies:  Allergies  Allergen Reactions  . Codeine Nausea And Vomiting  Altace Benicar Zocor and Lipitor   (Not in a hospital admission)  Results for orders placed during the hospital encounter of 09/15/13 (from the past 48 hour(s))  CBC     Status: Abnormal   Collection Time    09/15/13  2:45 PM      Result Value Range   WBC 9.1  4.0 - 10.5 K/uL   RBC 4.36  3.87 - 5.11 MIL/uL   Hemoglobin 11.6 (*) 12.0 - 15.0 g/dL   HCT 16.1  09.6 - 04.5 %   MCV 86.0  78.0 - 100.0 fL   MCH 26.6  26.0 - 34.0 pg   MCHC 30.9  30.0 - 36.0 g/dL   RDW 40.9  81.1 - 91.4 %   Platelets 313  150 - 400 K/uL  BASIC METABOLIC PANEL     Status: Abnormal   Collection Time    09/15/13  2:45 PM      Result Value Range   Sodium 140  135 - 145 mEq/L   Potassium 3.9  3.5 - 5.1 mEq/L   Chloride 101  96 - 112 mEq/L   CO2 30  19 - 32 mEq/L   Glucose, Bld 90  70 - 99 mg/dL   BUN 17  6 - 23 mg/dL   Creatinine, Ser 7.82  0.50 - 1.10 mg/dL   Calcium 8.9  8.4 - 95.6 mg/dL   GFR calc non Af Amer 87 (*) >90 mL/min   GFR calc Af Amer >90  >90 mL/min   Comment: (NOTE)    The eGFR has been calculated using the CKD EPI equation.     This calculation has not been validated in all clinical situations.     eGFR's persistently <90 mL/min signify possible Chronic Kidney     Disease.  PRO B NATRIURETIC PEPTIDE     Status: Abnormal   Collection Time    09/15/13  2:45 PM      Result Value Range   Pro B Natriuretic peptide (BNP) 1567.0 (*) 0 - 125 pg/mL  PROTIME-INR     Status: Abnormal   Collection Time    09/15/13  2:45 PM      Result Value Range   Prothrombin Time 33.3 (*) 11.6 - 15.2 seconds   INR 3.43 (*) 0.00 - 1.49  POCT I-STAT TROPONIN I     Status: None   Collection Time    09/15/13  2:58 PM  Result Value Range   Troponin i, poc 0.01  0.00 - 0.08 ng/mL   Comment 3            Comment: Due to the release kinetics of cTnI,     a negative result within the first hours     of the onset of symptoms does not rule out     myocardial infarction with certainty.     If myocardial infarction is still suspected,     repeat the test at appropriate intervals.   Dg Chest Portable 1 View  09/15/2013   CLINICAL DATA:  Shortness of breath  EXAM: PORTABLE CHEST - 1 VIEW  COMPARISON:  Prior chest x-ray 07/16/2005  FINDINGS: Enlargement of the cardiopericardial silhouette with double density sign overlying the right atrium suggestive of the left atrial enlargement. Atherosclerotic calcifications noted in the transverse aorta. Negative for focal airspace consolidation, pleural effusion or pneumothorax. Pulmonary vascular congestion bordering on mild interstitial edema. Chronic central bronchitic changes are noted.  IMPRESSION: Cardiomegaly, left atrial enlargement and pulmonary vascular congestion bordering on mild interstitial edema suggest mild/early CHF.  Aortic atherosclerosis.   Electronically Signed   By: Malachy Moan M.D.   On: 09/15/2013 16:19    ROS The patient admits to weight gain. Has vision change with need to wear reading glasses. Bilateral cataract  surgery. No hearing loss. No tinnitus. Positive full upper and lower dentures. No cough, hemoptysis, asthma, COPD or pneumonia. Positive history of palpitations, chest pain and leg edema. No history of nausea, vomiting, diarrhea, constipation, bleeding from the bowels, cancer, hiatal hernia or hepatitis. Positive history of blood transfusion. No history of kidney stone, hematuria, stroke, seizures, psychiatric admissions. Positive history of joint pains.  Blood pressure 132/96, pulse 85, temperature 98.2 F (36.8 C), temperature source Oral, resp. rate 26, SpO2 94.00%. Physical exam: HEENT: The patient is normocephalic, atraumatic. Wears glasses. Has light  brown eyes. Bil. Lens implants. Pupils equal and reacting to light. Wears upper and lower dentures.  NECK: No JVD, no carotid bruit, full range of motion. Neck is nontender without lymphadenopathy and no thyromegaly.  LUNGS: Bilateral basal crackles.  HEART: Normal S1, S2, with grade 2/6 systolic murmur.  ABDOMEN: Soft, nontender, distended with scars of surgery.  EXTREMITIES: 2+ edema, no cyanosis or clubbing. Spider veins bilaterally  with right knee swelling and tenderness bilaterally.  CNS: Cranial nerves grossly intact. Bilateral equal grips. The patient is  right-handed. Skin:-Warm and dry.  Assessment/Plan Acute left heart systolic failure Hypertension DM, II Obesity Hyperlipidemia Atrial fibrillation Asthma, chronic  Admit, IV lasix, O2, Home medications  Carey Lafon S 09/15/2013, 5:18 PM

## 2013-09-15 NOTE — ED Notes (Signed)
Pt up to bathroom with asst.  To void.  Unable to void at this time.  Bedside commode placed at bedside.

## 2013-09-16 LAB — GLUCOSE, CAPILLARY
Glucose-Capillary: 91 mg/dL (ref 70–99)
Glucose-Capillary: 62 mg/dL — ABNORMAL LOW (ref 70–99)
Glucose-Capillary: 64 mg/dL — ABNORMAL LOW (ref 70–99)
Glucose-Capillary: 59 mg/dL — ABNORMAL LOW (ref 70–99)
Glucose-Capillary: 73 mg/dL (ref 70–99)

## 2013-09-16 LAB — BASIC METABOLIC PANEL
BUN: 17 mg/dL (ref 6–23)
CO2: 32 mEq/L (ref 19–32)
GFR calc non Af Amer: 83 mL/min — ABNORMAL LOW (ref >90)
Glucose, Bld: 90 mg/dL (ref 70–99)
Potassium: 3.9 mEq/L (ref 3.5–5.1)

## 2013-09-16 LAB — PROTIME-INR
INR: 3.07 — ABNORMAL HIGH (ref 0.00–1.49)
Prothrombin Time: 30.6 seconds — ABNORMAL HIGH (ref 11.6–15.2)

## 2013-09-16 LAB — TSH: TSH: 3.25 u[IU]/mL (ref 0.350–4.500)

## 2013-09-16 MED ORDER — GLUCOSE 40 % PO GEL
ORAL | Status: AC
  Administered 2013-09-16: 37.5 g via ORAL
  Filled 2013-09-16: qty 1

## 2013-09-16 MED ORDER — GLUCOSE 40 % PO GEL
1.0000 | ORAL | Status: DC | PRN
  Administered 2013-09-16: 37.5 g via ORAL

## 2013-09-16 MED ORDER — BIOTENE DRY MOUTH MT LIQD
15.0000 mL | Freq: Two times a day (BID) | OROMUCOSAL | Status: DC
  Administered 2013-09-16 – 2013-09-23 (×14): 15 mL via OROMUCOSAL

## 2013-09-16 MED ORDER — GLUCOSE-VITAMIN C 4-6 GM-MG PO CHEW: 4.0000 | CHEWABLE_TABLET | ORAL | Status: DC | PRN

## 2013-09-16 MED ORDER — LEVOTHYROXINE SODIUM 75 MCG PO TABS
75.0000 ug | ORAL_TABLET | Freq: Every day | ORAL | Status: DC
  Administered 2013-09-17 – 2013-09-24 (×7): 75 ug via ORAL
  Filled 2013-09-16 (×9): qty 1

## 2013-09-16 MED ORDER — DEXTROSE 50 % IV SOLN
25.0000 mL | Freq: Once | INTRAVENOUS | Status: AC | PRN
  Administered 2013-09-16: 25 mL via INTRAVENOUS
  Filled 2013-09-16: qty 50

## 2013-09-16 NOTE — Progress Notes (Signed)
Patient's sacrum intact, sacral dressing applied to patient's sacrum per protocol. Lorretta Harp RN

## 2013-09-16 NOTE — Progress Notes (Signed)
Subjective:  Feeling better. Good diuresis. Leg edema persist. Afebrile. Borderline TSH level, normal digoxin level.  Objective:  Vital Signs in the last 24 hours: Temp:  [97 F (36.1 C)-97.9 F (36.6 C)] 97.9 F (36.6 C) (10/31 0914) Pulse Rate:  [85-105] 85 (10/31 1341) Cardiac Rhythm:  [-] Atrial fibrillation (10/31 0741) Resp:  [18-20] 20 (10/31 1341) BP: (107-136)/(66-83) 107/66 mmHg (10/31 1341) SpO2:  [93 %-100 %] 93 % (10/31 1615) Weight:  [106.232 kg (234 lb 3.2 oz)-107.049 kg (236 lb)] 106.232 kg (234 lb 3.2 oz) (10/31 0452)  Physical Exam: BP Readings from Last 1 Encounters:  09/16/13 107/66     Wt Readings from Last 1 Encounters:  09/16/13 106.232 kg (234 lb 3.2 oz)    Weight change:   HEENT: [place]/AT, Eyes-Light brown, PERL, EOMI, bilateral Lens implants, Conjunctiva-Pink, Sclera-Non-icteric. Wears upper and lower dentures. Neck: No JVD, No bruit, Trachea midline. Lungs:  Clear, Bilateral. Cardiac:  Regular rhythm, normal S1 and S2, no S3.  Abdomen:  Soft, non-tender. Extremities:  2 + edema present. No cyanosis. No clubbing. CNS: AxOx3, Cranial nerves grossly intact, moves all 4 extremities. Right handed. Skin: Warm and dry.   Intake/Output from previous day: 10/30 0701 - 10/31 0700 In: 480 [P.O.:480] Out: 2050 [Urine:2050]    Lab Results: BMET    Component Value Date/Time   NA 144 09/16/2013 0415   K 3.9 09/16/2013 0415   CL 103 09/16/2013 0415   CO2 32 09/16/2013 0415   GLUCOSE 90 09/16/2013 0415   BUN 17 09/16/2013 0415   CREATININE 0.73 09/16/2013 0415   CALCIUM 9.2 09/16/2013 0415   GFRNONAA 83* 09/16/2013 0415   GFRAA >90 09/16/2013 0415   CBC    Component Value Date/Time   WBC 9.1 09/15/2013 1445   RBC 4.36 09/15/2013 1445   HGB 11.6* 09/15/2013 1445   HCT 37.5 09/15/2013 1445   PLT 313 09/15/2013 1445   MCV 86.0 09/15/2013 1445   MCH 26.6 09/15/2013 1445   MCHC 30.9 09/15/2013 1445   RDW 14.9 09/15/2013 1445   CARDIAC ENZYMES No  results found for this basename: CKTOTAL, CKMB, CKMBINDEX, TROPONINI    Scheduled Meds: . antiseptic oral rinse  15 mL Mouth Rinse BID  . digoxin  0.25 mg Oral Daily  . diltiazem  30 mg Oral BID  . escitalopram  20 mg Oral Daily  . furosemide  40 mg Intravenous Q12H  . glipiZIDE  10 mg Oral BID AC  . insulin aspart  0-15 Units Subcutaneous TID WC  . insulin glargine  6 Units Subcutaneous QHS  . levothyroxine  50 mcg Oral QAC breakfast  . metFORMIN  1,000 mg Oral BID WC  . metoprolol succinate  25 mg Oral QPM  . multivitamin with minerals  1 tablet Oral Daily  . potassium chloride  20 mEq Oral BID  . ramipril  5 mg Oral Daily  . sodium chloride  3 mL Intravenous Q12H  . warfarin  7.5 mg Oral Custom  . Warfarin - Physician Dosing Inpatient   Does not apply q1800   Continuous Infusions:  PRN Meds:.sodium chloride, acetaminophen, ALPRAZolam, ondansetron (ZOFRAN) IV, sodium chloride  Assessment/Plan: Acute left heart systolic failure  Hypertension  DM, II  Obesity  Hyperlipidemia  Atrial fibrillation  Asthma, chronic Hypothyroidism  Continue diuresis.   LOS: 1 day    Orpah Cobb  MD  09/16/2013, 6:07 PM

## 2013-09-16 NOTE — Progress Notes (Signed)
Patient's CBG is 63, patient asymptomatic, orange juice and graham crackers given; will continue to monitor and recheck blood sugar. Lorretta Harp RN

## 2013-09-16 NOTE — Evaluation (Signed)
Physical Therapy Evaluation Patient Details Name: Tracey [name] MRN: [id number] DOB: October 27, 1941 Today's Date: 09/16/2013 Time: 6295-2841 PT Time Calculation (min): 25 min  PT Assessment / Plan / Recommendation History of Present Illness  72 y.o. female admitted to [facility] on 09/15/13 with LE edema and difficulty breathing.  Dx with CHF exacerbation.    Clinical Impression  Pt is doing well with her mobility.  Despite DOE 2/4 with gait she was able to maintain O2 sats 90-94% on RA.  I anticipate once diuresed that her breathing will improve.   PT to follow acutely for deficits listed below.   No PT f/u needed at d/c.      PT Assessment  Patient needs continued PT services    Follow Up Recommendations  No PT follow up;Supervision - Intermittent    Does the patient have the potential to tolerate intense rehabilitation     NA  Barriers to Discharge   None      Equipment Recommendations  None recommended by PT    Recommendations for Other Services   None  Frequency Min 3X/week    Precautions / Restrictions Precautions Precautions: Other (comment) Precaution Comments: monitor O2 sats with mobility, needs to use the bathroom before gait- or wear pads/depends.     Pertinent Vitals/Pain O2 sats 94-90% on RA.        Mobility  Bed Mobility Bed Mobility: Supine to Sit;Sitting - Scoot to Edge of Bed Supine to Sit: 6: Modified independent (Device/Increase time);With rails;HOB flat Sitting - Scoot to Edge of Bed: 6: Modified independent (Device/Increase time);With rail Details for Bed Mobility Assistance: pt using railing for support.   Transfers Transfers: Sit to Stand;Stand to Sit Sit to Stand: 6: Modified independent (Device/Increase time);With upper extremity assist;With armrests;From bed Stand to Sit: 6: Modified independent (Device/Increase time);With upper extremity assist;With armrests;To bed Details for Transfer Assistance: used hands to support her during transitions.    Ambulation/Gait Ambulation/Gait Assistance: 5: Supervision Ambulation Distance (Feet): 150 Feet Assistive device: None Ambulation/Gait Assistance Details: pt with slow, but steady gait speed.  DOE 2/4 by the end of the walk and O2 sats down to 90% from 94% on RA at the beginning and middle of the walk. Pt had incontinent episode in the middle of the hallway (she is on lasix) despite reporting that she did not need to go when I asked on our way out of the room.  I recommended that she try to go anyway before walking in the hallway next time and have her son bring her depends up from home.   Gait Pattern: Step-through pattern;Shuffle Gait velocity: decreased        PT Diagnosis: Difficulty walking;Abnormality of gait;Generalized weakness  PT Problem List: Decreased strength;Decreased activity tolerance;Decreased balance;Decreased mobility;Cardiopulmonary status limiting activity PT Treatment Interventions: Gait training;Functional mobility training;Stair training;Therapeutic activities;Therapeutic exercise;Balance training;Neuromuscular re-education;Patient/family education     PT Goals(Current goals can be found in the care plan section) Acute Rehab PT Goals Patient Stated Goal: to get "this fluid off of me" PT Goal Formulation: With patient Time For Goal Achievement: 09/30/13 Potential to Achieve Goals: Good  Visit Information  Last PT Received On: 09/16/13 Assistance Needed: +1 History of Present Illness: 72 y.o. female admitted to [facility] on 09/15/13 with LE edema and difficulty breathing.  Dx with CHF exacerbation.         Prior Functioning  Home Living Family/patient expects to be discharged to:: Private residence Living Arrangements: Alone Available Help at Discharge: Family;Available PRN/intermittently Type of  Home: House Home Access: Stairs to enter [employer] of Steps: 3 Entrance Stairs-Rails: Right;Left;Can reach both Home Layout: One level Home Equipment:  Cane - quad Additional Comments: uses cane when out in the community in case her right knee gives her issues.   Prior Function Level of Independence: Independent;Independent with assistive device(s) Comments: pt does not drive at night any more, but generally takes care of herself and her own home.   Communication Communication: No difficulties    Cognition  Cognition Arousal/Alertness: Awake/alert Behavior During Therapy: WFL for tasks assessed/performed Overall Cognitive Status: Within Functional Limits for tasks assessed    Extremity/Trunk Assessment Upper Extremity Assessment Upper Extremity Assessment: Overall WFL for tasks assessed Lower Extremity Assessment Lower Extremity Assessment: RLE deficits/detail RLE Deficits / Details: right leg with arthritic changes and small medial meniscus tear per pt.  She has had two injections and it gives her "trouble" at times.   Cervical / Trunk Assessment Cervical / Trunk Assessment: Normal      End of Session PT - End of Session Activity Tolerance: Patient limited by fatigue Patient left: in bed;with family/visitor present;with call bell/phone within reach Nurse Communication: Mobility status;Other (comment) (O2 sats and incontinence episode.  )    Lurena Joiner B. Jamella Grayer, PT, DPT [phone]   09/16/2013, 5:10 PM

## 2013-09-16 NOTE — Progress Notes (Signed)
  Echocardiogram 2D Echocardiogram has been performed.  Jorje Guild 09/16/2013, 9:49 AM

## 2013-09-16 NOTE — Progress Notes (Signed)
Utilization Review Completed.Tracey [name] T10/31/2014

## 2013-09-16 NOTE — Progress Notes (Signed)
Patient is stable and has no complaints of pain, patient has been assessed, will continue to monitor patient. Lorretta Harp RN

## 2013-09-17 LAB — GLUCOSE, CAPILLARY
Glucose-Capillary: 61 mg/dL — ABNORMAL LOW (ref 70–99)
Glucose-Capillary: 70 mg/dL (ref 70–99)
Glucose-Capillary: 76 mg/dL (ref 70–99)
Glucose-Capillary: 70 mg/dL (ref 70–99)
Glucose-Capillary: 169 mg/dL — ABNORMAL HIGH (ref 70–99)

## 2013-09-17 LAB — COMPREHENSIVE METABOLIC PANEL
CO2: 32 mEq/L (ref 19–32)
Calcium: 9.5 mg/dL (ref 8.4–10.5)
Creatinine, Ser: 0.7 mg/dL (ref 0.50–1.10)
GFR calc Af Amer: >90 mL/min (ref >90)
GFR calc non Af Amer: 85 mL/min — ABNORMAL LOW (ref >90)
Glucose, Bld: 63 mg/dL — ABNORMAL LOW (ref 70–99)
Potassium: 3.8 mEq/L (ref 3.5–5.1)
Total Protein: 7.8 g/dL (ref 6.0–8.3)

## 2013-09-17 LAB — PROTIME-INR: INR: 2.29 — ABNORMAL HIGH (ref 0.00–1.49)

## 2013-09-17 MED ORDER — DEXTROSE 5 % IV SOLN
INTRAVENOUS | Status: DC
  Administered 2013-09-17 (×2): via INTRAVENOUS

## 2013-09-17 MED ORDER — DEXTROSE 50 % IV SOLN
INTRAVENOUS | Status: AC
  Administered 2013-09-17: 50 mL via INTRAVENOUS
  Filled 2013-09-17: qty 50

## 2013-09-17 MED ORDER — SPIRONOLACTONE 25 MG PO TABS
25.0000 mg | ORAL_TABLET | Freq: Every day | ORAL | Status: DC
  Administered 2013-09-17 – 2013-09-24 (×8): 25 mg via ORAL
  Filled 2013-09-17 (×10): qty 1

## 2013-09-17 MED ORDER — DEXTROSE 50 % IV SOLN
1.0000 | Freq: Once | INTRAVENOUS | Status: AC
  Administered 2013-09-17: 50 mL via INTRAVENOUS

## 2013-09-17 NOTE — Progress Notes (Signed)
Subjective:  Patient denies any chest pain states her breathing is slightly improved. Patient had multiple episodes of hypoglycemia requiring D50 and then was started on D5W. States feeling better. Leg swelling persist  Objective:  Vital Signs in the last 24 hours: Temp:  [97.9 F (36.6 C)-98.6 F (37 C)] 98.6 F (37 C) (11/01 0500) Pulse Rate:  [72-106] 72 (11/01 0500) Resp:  [18-20] 18 (11/01 0500) BP: (107-127)/(58-76) 111/59 mmHg (11/01 0500) SpO2:  [92 %-98 %] 96 % (11/01 0500) Weight:  [104.282 kg (229 lb 14.4 oz)] 104.282 kg (229 lb 14.4 oz) (11/01 0500)  Intake/Output from previous day: 10/31 0701 - 11/01 0700 In: 723 [P.O.:720; I.V.:3] Out: 3450 [Urine:3450] Intake/Output from this shift:    Physical Exam: Neck: no adenopathy, no carotid bruit and supple, symmetrical, trachea midline Lungs: Decreased breath sound at bases with bibasilar rales Heart: irregularly irregular rhythm, S1, S2 normal and Soft systolic murmur and S3 gallop noted Abdomen: soft, non-tender; bowel sounds normal; no masses,  no organomegaly Extremities: No clubbing cyanosis 4+ edema noted  Lab Results:  Recent Labs  09/15/13 1445  WBC 9.1  HGB 11.6*  PLT 313    Recent Labs  09/16/13 0415 09/17/13 0545  NA 144 141  K 3.9 3.8  CL 103 97  CO2 32 32  GLUCOSE 90 63*  BUN 17 15  CREATININE 0.73 0.70   No results found for this basename: TROPONINI, CK, MB,  in the last 72 hours Hepatic Function Panel  Recent Labs  09/17/13 0545  PROT 7.8  ALBUMIN 3.6  AST 16  ALT 9  ALKPHOS 75  BILITOT 0.9   No results found for this basename: CHOL,  in the last 72 hours No results found for this basename: PROTIME,  in the last 72 hours  Imaging: Imaging results have been reviewed and Dg Chest Portable 1 View  09/15/2013   CLINICAL DATA:  Shortness of breath  EXAM: PORTABLE CHEST - 1 VIEW  COMPARISON:  Prior chest x-ray 07/16/2005  FINDINGS: Enlargement of the cardiopericardial silhouette  with double density sign overlying the right atrium suggestive of the left atrial enlargement. Atherosclerotic calcifications noted in the transverse aorta. Negative for focal airspace consolidation, pleural effusion or pneumothorax. Pulmonary vascular congestion bordering on mild interstitial edema. Chronic central bronchitic changes are noted.  IMPRESSION: Cardiomegaly, left atrial enlargement and pulmonary vascular congestion bordering on mild interstitial edema suggest mild/early CHF.  Aortic atherosclerosis.   Electronically Signed   By: Malachy Moan M.D.   On: 09/15/2013 16:19    Cardiac Studies:  Assessment/Plan:  Decompensated acute systolic heart failure Chronic atrial fibrillation Hypertension Diabetes mellitus Status post hypoglycemia Morbid obesity Hypercholesteremia Hypothyroidism Plan As per orders  LOS: 2 days    Jalyssa Fleisher N 09/17/2013, 9:04 AM

## 2013-09-17 NOTE — Progress Notes (Signed)
Pt continues with hypoglycemia despite snacks. Glipizide and Metformin was given at 1700. Serial CBG's in the 50s and 60s. Oral glucose gel given, and CBG = 73. CBG = 59 upon reassessment before midnight. 1/2 amp D50 given. Will recheck CBG, notify MD, and continue to monitor.

## 2013-09-18 LAB — BASIC METABOLIC PANEL
BUN: 14 mg/dL (ref 6–23)
Calcium: 9.2 mg/dL (ref 8.4–10.5)
Chloride: 97 mEq/L (ref 96–112)
Creatinine, Ser: 0.73 mg/dL (ref 0.50–1.10)
GFR calc Af Amer: >90 mL/min (ref >90)
Glucose, Bld: 125 mg/dL — ABNORMAL HIGH (ref 70–99)
Potassium: 3.7 mEq/L (ref 3.5–5.1)
Sodium: 141 mEq/L (ref 135–145)

## 2013-09-18 LAB — GLUCOSE, CAPILLARY
Glucose-Capillary: 127 mg/dL — ABNORMAL HIGH (ref 70–99)
Glucose-Capillary: 138 mg/dL — ABNORMAL HIGH (ref 70–99)
Glucose-Capillary: 193 mg/dL — ABNORMAL HIGH (ref 70–99)

## 2013-09-18 LAB — PROTIME-INR
INR: 2.11 — ABNORMAL HIGH (ref 0.00–1.49)
Prothrombin Time: 23 seconds — ABNORMAL HIGH (ref 11.6–15.2)

## 2013-09-18 LAB — CBC
HCT: 36.5 % (ref 36.0–46.0)
Hemoglobin: 11.4 g/dL — ABNORMAL LOW (ref 12.0–15.0)
MCH: 26.4 pg (ref 26.0–34.0)
RDW: 15.2 % (ref 11.5–15.5)
WBC: 8.1 10*3/uL (ref 4.0–10.5)

## 2013-09-18 LAB — PRO B NATRIURETIC PEPTIDE: Pro B Natriuretic peptide (BNP): 2207 pg/mL — ABNORMAL HIGH (ref 0–125)

## 2013-09-18 MED ORDER — DEXTROSE 5 % IV SOLN
500.0000 mg | Freq: Once | INTRAVENOUS | Status: AC
  Administered 2013-09-18: 15:00:00 500 mg via INTRAVENOUS
  Filled 2013-09-18: qty 500

## 2013-09-18 NOTE — Progress Notes (Signed)
Patient resting quietly after Xanax PO given. No complaints of pain or SOB.

## 2013-09-18 NOTE — Progress Notes (Signed)
Subjective:  Patient denies any chest pain states breathing has improved. No further episodes of hypoglycemia. Leg swelling slightly improved  Objective:  Vital Signs in the last 24 hours: Temp:  [97.3 F (36.3 C)-98 F (36.7 C)] 97.3 F (36.3 C) (11/02 0545) Pulse Rate:  [76-96] 78 (11/02 0545) Resp:  [19-20] 20 (11/02 0545) BP: (124-151)/(57-107) 134/107 mmHg (11/02 1055) SpO2:  [92 %-99 %] 99 % (11/02 0545) Weight:  [100.6 kg (221 lb 12.5 oz)] 100.6 kg (221 lb 12.5 oz) (11/02 0545)  Intake/Output from previous day: 11/01 0701 - 11/02 0700 In: 800 [P.O.:800] Out: 1400 [Urine:1400] Intake/Output from this shift: Total I/O In: 120 [P.O.:120] Out: 400 [Urine:400]  Physical Exam: Neck: no adenopathy, no carotid bruit, no JVD and supple, symmetrical, trachea midline Lungs: Faint bibasilar rales noted Heart: irregularly irregular rhythm, S1, S2 normal and Soft systolic murmur and S3 gallop noted Abdomen: soft, non-tender; bowel sounds normal; no masses,  no organomegaly Extremities: No clubbing cyanosis 3+ edema noted  Lab Results:  Recent Labs  09/15/13 1445 09/18/13 0545  WBC 9.1 8.1  HGB 11.6* 11.4*  PLT 313 308    Recent Labs  09/17/13 0545 09/18/13 0545  NA 141 141  K 3.8 3.7  CL 97 97  CO2 32 35*  GLUCOSE 63* 125*  BUN 15 14  CREATININE 0.70 0.73   No results found for this basename: TROPONINI, CK, MB,  in the last 72 hours Hepatic Function Panel  Recent Labs  09/17/13 0545  PROT 7.8  ALBUMIN 3.6  AST 16  ALT 9  ALKPHOS 75  BILITOT 0.9   No results found for this basename: CHOL,  in the last 72 hours No results found for this basename: PROTIME,  in the last 72 hours  Imaging: Imaging results have been reviewed and No results found.  Cardiac Studies:  Assessment/Plan:  Resolving Decompensated acute systolic heart failure  Chronic atrial fibrillation  Hypertension  Diabetes mellitus  Status post hypoglycemia  Morbid obesity   Hypercholesteremia  Hypothyroidism Metabolic alkalosis secondary to diuretics Plan Diamox one dose as per orders Check labs in a.m.  LOS: 3 days    Tracey [name] 09/18/2013, 11:35 AM

## 2013-09-19 LAB — CBC
HCT: 40.6 % (ref 36.0–46.0)
MCHC: 32.1 g/dL (ref 30.0–36.0)
MCV: 84.9 fL (ref 78.0–100.0)
Platelets: 356 10*3/uL (ref 150–400)
RBC: 4.49 MIL/uL (ref 3.87–5.11)
RBC: 4.78 MIL/uL (ref 3.87–5.11)
RDW: 15.1 % (ref 11.5–15.5)
RDW: 15 % (ref 11.5–15.5)
WBC: 10.6 10*3/uL — ABNORMAL HIGH (ref 4.0–10.5)

## 2013-09-19 LAB — SURGICAL PCR SCREEN
MRSA, PCR: NEGATIVE
Staphylococcus aureus: NEGATIVE

## 2013-09-19 LAB — BASIC METABOLIC PANEL
BUN: 20 mg/dL (ref 6–23)
CO2: 31 mEq/L (ref 19–32)
CO2: 30 mEq/L (ref 19–32)
Calcium: 9.1 mg/dL (ref 8.4–10.5)
Chloride: 95 mEq/L — ABNORMAL LOW (ref 96–112)
Creatinine, Ser: 0.84 mg/dL (ref 0.50–1.10)
GFR calc Af Amer: 79 mL/min — ABNORMAL LOW (ref >90)
GFR calc Af Amer: 75 mL/min — ABNORMAL LOW (ref >90)
GFR calc non Af Amer: 68 mL/min — ABNORMAL LOW (ref >90)
Potassium: 3.8 mEq/L (ref 3.5–5.1)

## 2013-09-19 LAB — GLUCOSE, CAPILLARY
Glucose-Capillary: 155 mg/dL — ABNORMAL HIGH (ref 70–99)
Glucose-Capillary: 180 mg/dL — ABNORMAL HIGH (ref 70–99)

## 2013-09-19 LAB — PROTIME-INR: Prothrombin Time: 22.7 seconds — ABNORMAL HIGH (ref 11.6–15.2)

## 2013-09-19 LAB — PRO B NATRIURETIC PEPTIDE: Pro B Natriuretic peptide (BNP): 1879 pg/mL — ABNORMAL HIGH (ref 0–125)

## 2013-09-19 MED ORDER — SODIUM CHLORIDE 0.9 % IV SOLN: 250.0000 mL | INTRAVENOUS | Status: DC | PRN

## 2013-09-19 MED ORDER — ASPIRIN 81 MG PO CHEW
81.0000 mg | CHEWABLE_TABLET | ORAL | Status: AC
  Administered 2013-09-20: 06:00:00 81 mg via ORAL
  Filled 2013-09-19: qty 1

## 2013-09-19 MED ORDER — SODIUM CHLORIDE 0.9 % IV SOLN
INTRAVENOUS | Status: DC
  Administered 2013-09-19: 20:00:00 via INTRAVENOUS

## 2013-09-19 MED ORDER — SODIUM CHLORIDE 0.9 % IJ SOLN
3.0000 mL | Freq: Two times a day (BID) | INTRAMUSCULAR | Status: DC
  Administered 2013-09-19 – 2013-09-20 (×2): 3 mL via INTRAVENOUS

## 2013-09-19 MED ORDER — SODIUM CHLORIDE 0.9 % IJ SOLN: 3.0000 mL | INTRAMUSCULAR | Status: DC | PRN

## 2013-09-19 MED ORDER — PHYTONADIONE 5 MG PO TABS
10.0000 mg | ORAL_TABLET | Freq: Once | ORAL | Status: AC
  Administered 2013-09-19: 10 mg via ORAL
  Filled 2013-09-19: qty 2

## 2013-09-19 MED ORDER — DIAZEPAM 5 MG PO TABS: 5.0000 mg | ORAL_TABLET | ORAL | Status: AC

## 2013-09-19 NOTE — Progress Notes (Signed)
Subjective:  Feeling better. No more hypoglycemia. Afebrile.  Objective:  Vital Signs in the last 24 hours: Temp:  [97.6 F (36.4 C)-98.6 F (37 C)] 97.6 F (36.4 C) (11/03 0500) Pulse Rate:  [80-93] 91 (11/03 0912) Cardiac Rhythm:  [-] Atrial fibrillation (11/02 1946) Resp:  [20] 20 (11/03 0500) BP: (108-134)/(54-107) 118/54 mmHg (11/03 0913) SpO2:  [95 %-96 %] 96 % (11/03 0500) Weight:  [98.249 kg (216 lb 9.6 oz)] 98.249 kg (216 lb 9.6 oz) (11/03 0500)  Physical Exam: BP Readings from Last 1 Encounters:  09/19/13 118/54     Wt Readings from Last 1 Encounters:  09/19/13 98.249 kg (216 lb 9.6 oz)    Weight change: -2.351 kg (-5 lb 2.9 oz)  HEENT: [place]/AT, Eyes-Light brown, PERL, EOMI, Conjunctiva-Pink, Sclera-Non-icteric. Bil. Lens implants Neck: No JVD, No bruit, Trachea midline. Lungs:  Clear, Bilateral. Cardiac:  Regular rhythm, normal S1 and S2, no S3.  Abdomen:  Soft, non-tender. Extremities:  1 + lower leg edema present. No cyanosis. No clubbing. CNS: AxOx3, Cranial nerves grossly intact, moves all 4 extremities. Right handed. Skin: Warm and dry.   Intake/Output from previous day: 11/02 0701 - 11/03 0700 In: 820 [P.O.:820] Out: 3050 [Urine:3050]    Lab Results: BMET    Component Value Date/Time   NA 140 09/19/2013 0415   K 3.8 09/19/2013 0415   CL 99 09/19/2013 0415   CO2 31 09/19/2013 0415   GLUCOSE 159* 09/19/2013 0415   BUN 20 09/19/2013 0415   CREATININE 0.84 09/19/2013 0415   CALCIUM 9.1 09/19/2013 0415   GFRNONAA 68* 09/19/2013 0415   GFRAA 79* 09/19/2013 0415   CBC    Component Value Date/Time   WBC 10.2 09/19/2013 0415   RBC 4.49 09/19/2013 0415   HGB 12.2 09/19/2013 0415   HCT 38.0 09/19/2013 0415   PLT 383 09/19/2013 0415   MCV 84.6 09/19/2013 0415   MCH 27.2 09/19/2013 0415   MCHC 32.1 09/19/2013 0415   RDW 15.1 09/19/2013 0415   CARDIAC ENZYMES No results found for this basename: CKTOTAL, CKMB, CKMBINDEX, TROPONINI    Scheduled Meds: . antiseptic  oral rinse  15 mL Mouth Rinse BID  . digoxin  0.25 mg Oral Daily  . escitalopram  20 mg Oral Daily  . furosemide  40 mg Intravenous Q12H  . insulin aspart  0-15 Units Subcutaneous TID WC  . insulin glargine  6 Units Subcutaneous QHS  . levothyroxine  75 mcg Oral QAC breakfast  . metoprolol succinate  25 mg Oral QPM  . multivitamin with minerals  1 tablet Oral Daily  . potassium chloride  20 mEq Oral BID  . ramipril  5 mg Oral Daily  . sodium chloride  3 mL Intravenous Q12H  . spironolactone  25 mg Oral Daily  . warfarin  7.5 mg Oral Custom  . Warfarin - Physician Dosing Inpatient   Does not apply q1800   Continuous Infusions:  PRN Meds:.sodium chloride, acetaminophen, ALPRAZolam, dextrose, glucose-Vitamin C, ondansetron (ZOFRAN) IV, sodium chloride  Assessment/Plan: Acute left heart systolic failure  Hypertension  DM, II  Obesity  Hyperlipidemia  Atrial fibrillation  Asthma, chronic  Hypothyroidism  Increase activity. Continue diuresis.   LOS: 4 days    Orpah Cobb  MD  09/19/2013, 9:20 AM

## 2013-09-19 NOTE — Progress Notes (Signed)
Physical Therapy Treatment Patient Details Name: Tracey [name] MRN: [id number] DOB: 1941/05/06 Today's Date: 09/19/2013 Time: 4098-1191 PT Time Calculation (min): 24 min  PT Assessment / Plan / Recommendation  History of Present Illness 72 y.o. female admitted to [facility] on 09/15/13 with LE edema and difficulty breathing.  Dx with CHF exacerbation.     PT Comments   Pt with excellent progression able to ambulate the entire unit without difficulty today. Pt reports continuing to feel work but no Sob with sats 90-98% throughout all activity with standing rest after stairs with cueing for pursed lip breathing to maximize function. Pt encouraged to continue mobility and HEP.  Follow Up Recommendations  No PT follow up     Does the patient have the potential to tolerate intense rehabilitation     Barriers to Discharge        Equipment Recommendations       Recommendations for Other Services    Frequency     Progress towards PT Goals Progress towards PT goals: Progressing toward goals  Plan Current plan remains appropriate    Precautions / Restrictions Precautions Precaution Comments: monitor O2 sats with mobility, needs to use the bathroom before gait- or wear pads/depends.     Pertinent Vitals/Pain No pain    Mobility  Bed Mobility Bed Mobility: Supine to Sit Supine to Sit: 6: Modified independent (Device/Increase time);With rails;HOB flat Transfers Sit to Stand: 6: Modified independent (Device/Increase time);With upper extremity assist;From bed Stand to Sit: 6: Modified independent (Device/Increase time);With upper extremity assist;With armrests;To chair/3-in-1 Ambulation/Gait Ambulation/Gait Assistance: 6: Modified independent (Device/Increase time) Ambulation Distance (Feet): 550 Feet Assistive device: None Ambulation/Gait Assistance Details: pt with slow steady gait with sats maintained 90-94% with ambulation and stairs Gait Pattern: Step-through pattern;Decreased stride  length Gait velocity: 30'/18 sec=1.66 ft/sec placing pt at increased fall risk Stairs: Yes Stairs Assistance: 6: Modified independent (Device/Increase time) Stair Management Technique: Two rails;Step to pattern;Forwards Number of Stairs: 3    Exercises General Exercises - Lower Extremity Long Arc Quad: AROM;Both;20 reps;Seated Hip ABduction/ADduction: AROM;Seated;Both;20 reps Hip Flexion/Marching: AROM;Seated;Both;20 reps Toe Raises: AROM;Seated;Both;20 reps Heel Raises: AROM;Seated;Both;20 reps   PT Diagnosis:    PT Problem List:   PT Treatment Interventions:     PT Goals (current goals can now be found in the care plan section)    Visit Information  Last PT Received On: 09/19/13 Assistance Needed: +1 History of Present Illness: 72 y.o. female admitted to [facility] on 09/15/13 with LE edema and difficulty breathing.  Dx with CHF exacerbation.      Subjective Data      Cognition  Cognition Arousal/Alertness: Awake/alert Behavior During Therapy: WFL for tasks assessed/performed Overall Cognitive Status: Within Functional Limits for tasks assessed    Balance     End of Session PT - End of Session Equipment Utilized During Treatment: Gait belt Activity Tolerance: Patient tolerated treatment well Patient left: in chair;with call bell/phone within reach Nurse Communication: Mobility status   GP     [name] 09/19/2013, 10:14 AM [name], PT [phone]

## 2013-09-19 NOTE — Progress Notes (Signed)
Inpatient Diabetes Program Recommendations  AACE/ADA: New Consensus Statement on Inpatient Glycemic Control (2013)  Target Ranges:  Prepandial:   less than 140 mg/dL      Peak postprandial:   less than 180 mg/dL (1-2 hours)      Critically ill patients:  140 - 180 mg/dL   Reason for Visit: Recurrent hypoglycemia  Results for Tracey [name], Tracey [name] (MRN [id number]) as of 09/19/2013 12:07  Ref. Range 09/16/2013 11:46 09/16/2013 16:53 09/16/2013 18:41 09/16/2013 19:25 09/16/2013 20:18 09/16/2013 21:23 09/16/2013 23:26  Glucose-Capillary Latest Range: 70-99 mg/dL 91 62 (L) 63 (L) 64 (L) 59 (L) 73 59 (L)  Results for Tracey [name], Tracey [name] (MRN [id number]) as of 09/19/2013 12:07   Ref. Range 09/17/2013 00:08 09/17/2013 01:03 09/17/2013 01:55 09/17/2013 02:27 09/17/2013 03:23 09/17/2013 04:33 09/17/2013 05:41 09/17/2013 07:29 09/17/2013 08:35 09/17/2013 10:20 09/17/2013 11:46 09/17/2013 16:33 09/17/2013 21:53  Glucose-Capillary Latest Range: 70-99 mg/dL 68 (L) 562 (H) 70 61 (L) 115 (H) 76 70 90 122 (H) 140 (H) 141 (H) 127 (H) 169 (H)  Results for Tracey [name], Tracey [name] (MRN [id number]) as of 09/19/2013 12:07  Ref. Range 09/18/2013 06:17 09/18/2013 11:12 09/18/2013 16:09 09/18/2013 21:00 09/19/2013 05:54 09/19/2013 11:27  Glucose-Capillary Latest Range: 70-99 mg/dL 696 (H) 295 (H) 284 (H) 193 (H) 155 (H) 219 (H)   Note: Noted patient was having recurrent hypoglycemia on 10/31 and 11/1.  Likely due to effect of oral diabetic medications and Lantus.  According to the home medication list, patient takes Glipizide 10 mg BID and Metformin 1000 mg BID as an outpatient for diabetes management.  Patient was initially ordered Glipizide 10 mg BID, Metformin 1000 mg BID, Lantus 6 units QHS, and Novolog 0-15 units AC for inpatient glycemic control but the Glipizide and Metformin were discontinued on 11/1 and Lantus was given on 10/30 at 21:51 and then was not given again until 11/1 at 21:30.  Blood glucose has improved and maintained between 127-219 mg/dl  over the past 30 hours without any further episodes of hypoglycemia. Will continue to follow.  Thanks, Orlando Penner, RN, MSN, CCRN Diabetes Coordinator Inpatient Diabetes Program [phone] (Team Pager) [phone] (AP office) [phone] [facility] office)

## 2013-09-20 ENCOUNTER — Encounter (HOSPITAL_COMMUNITY)
Admission: EM | Disposition: A | Discharge status: Home / Self Care | Payer: Self-pay | Source: Home / Self Care | Attending: Cardiovascular Disease

## 2013-09-20 HISTORY — PX: LEFT AND RIGHT HEART CATHETERIZATION WITH CORONARY ANGIOGRAM: SHX5449

## 2013-09-20 LAB — GLUCOSE, CAPILLARY
Glucose-Capillary: 138 mg/dL — ABNORMAL HIGH (ref 70–99)
Glucose-Capillary: 132 mg/dL — ABNORMAL HIGH (ref 70–99)
Glucose-Capillary: 107 mg/dL — ABNORMAL HIGH (ref 70–99)
Glucose-Capillary: 216 mg/dL — ABNORMAL HIGH (ref 70–99)

## 2013-09-20 LAB — POCT I-STAT 3, ART BLOOD GAS (G3+)
Acid-Base Excess: 2 mmol/L (ref 0.0–2.0)
O2 Saturation: 89 %
TCO2: 30 mmol/L (ref 0–100)

## 2013-09-20 LAB — POCT I-STAT 3, VENOUS BLOOD GAS (G3P V)
Acid-Base Excess: 2 mmol/L (ref 0.0–2.0)
Bicarbonate: 29.1 mEq/L — ABNORMAL HIGH (ref 20.0–24.0)
TCO2: 31 mmol/L (ref 0–100)
pCO2, Ven: 53.6 mmHg — ABNORMAL HIGH (ref 45.0–50.0)
pH, Ven: 7.342 — ABNORMAL HIGH (ref 7.250–7.300)

## 2013-09-20 LAB — PROTIME-INR
INR: 1.52 — ABNORMAL HIGH (ref 0.00–1.49)
Prothrombin Time: 17.9 seconds — ABNORMAL HIGH (ref 11.6–15.2)

## 2013-09-20 SURGERY — LEFT AND RIGHT HEART CATHETERIZATION WITH CORONARY ANGIOGRAM
Anesthesia: LOCAL

## 2013-09-20 MED ORDER — FENTANYL CITRATE 0.05 MG/ML IJ SOLN
INTRAMUSCULAR | Status: AC
  Filled 2013-09-20: qty 2

## 2013-09-20 MED ORDER — HEPARIN (PORCINE) IN NACL 2-0.9 UNIT/ML-% IJ SOLN
INTRAMUSCULAR | Status: AC
  Filled 2013-09-20: qty 1000

## 2013-09-20 MED ORDER — MIDAZOLAM HCL 2 MG/2ML IJ SOLN
INTRAMUSCULAR | Status: AC
  Filled 2013-09-20: qty 2

## 2013-09-20 MED ORDER — SODIUM CHLORIDE 0.9 % IV SOLN
INTRAVENOUS | Status: DC
  Administered 2013-09-20: 19:00:00 250 mL via INTRAVENOUS

## 2013-09-20 MED ORDER — ENOXAPARIN SODIUM 100 MG/ML ~~LOC~~ SOLN
100.0000 mg | Freq: Two times a day (BID) | SUBCUTANEOUS | Status: DC
  Administered 2013-09-21 (×2): 100 mg via SUBCUTANEOUS
  Filled 2013-09-20 (×3): qty 1

## 2013-09-20 MED ORDER — LIDOCAINE HCL (PF) 1 % IJ SOLN
INTRAMUSCULAR | Status: AC
  Filled 2013-09-20: qty 30

## 2013-09-20 MED ORDER — SODIUM CHLORIDE 0.9 % IV SOLN: 250.0000 mL | INTRAVENOUS | Status: DC | PRN

## 2013-09-20 MED ORDER — NITROGLYCERIN 0.2 MG/ML ON CALL CATH LAB
INTRAVENOUS | Status: AC
  Filled 2013-09-20: qty 1

## 2013-09-20 MED ORDER — SODIUM CHLORIDE 0.9 % IJ SOLN: 3.0000 mL | INTRAMUSCULAR | Status: DC | PRN

## 2013-09-20 MED ORDER — WARFARIN SODIUM 10 MG PO TABS
10.0000 mg | ORAL_TABLET | Freq: Once | ORAL | Status: AC
  Administered 2013-09-21: 10 mg via ORAL
  Filled 2013-09-20: qty 1

## 2013-09-20 MED ORDER — SODIUM CHLORIDE 0.9 % IJ SOLN
3.0000 mL | Freq: Two times a day (BID) | INTRAMUSCULAR | Status: DC
  Administered 2013-09-21 (×2): 3 mL via INTRAVENOUS

## 2013-09-20 NOTE — Interval H&P Note (Signed)
Cath Lab Visit (complete for each Cath Lab visit)  Clinical Evaluation Leading to the Procedure:   ACS: no  Non-ACS:    Anginal Classification: CCS II  Anti-ischemic medical therapy: Maximal Therapy (2 or more classes of medications)  Non-Invasive Test Results: No non-invasive testing performed  Prior CABG: No previous CABG      History and Physical Interval Note:  09/20/2013 1:28 PM  Tracey [name]  has presented today for surgery, with the diagnosis of CHF  The various methods of treatment have been discussed with the patient and family. After consideration of risks, benefits and other options for treatment, the patient has consented to  Procedure(s): LEFT AND RIGHT HEART CATHETERIZATION WITH CORONARY ANGIOGRAM (N/A) as a surgical intervention .  The patient's history has been reviewed, patient examined, no change in status, stable for surgery.  I have reviewed the patient's chart and labs.  Questions were answered to the patient's satisfaction.     Pualani Borah S

## 2013-09-20 NOTE — Progress Notes (Signed)
Pt. Up to restroom unassisted post-cath. Off going RN explained to pt. Importance of bed rest post procedure. Bed alarm place on pt. RN will continue to monitor pt. For changes in condition. Tracey [name], Cheryll Dessert

## 2013-09-20 NOTE — Progress Notes (Signed)
ANTICOAGULATION CONSULT NOTE - Initial Consult  Pharmacy Consult:  Lovenox Indication: atrial fibrillation  Allergies  Allergen Reactions  . Codeine Nausea And Vomiting    Patient Measurements: Height: 5\' 3"  (160 cm) Weight: 214 lb 4.6 oz (97.2 kg) IBW/kg (Calculated) : 52.4  Vital Signs: Temp: 98 F (36.7 C) (11/04 1334) Temp src: Oral (11/04 1334) BP: 103/57 mmHg (11/04 1334) Pulse Rate: 67 (11/04 1403)  Labs:  Recent Labs  09/18/13 0545 09/19/13 0415 09/19/13 2105 09/20/13 0500  HGB 11.4* 12.2 12.2  --   HCT 36.5 38.0 40.6  --   PLT 308 383 356  --   LABPROT 23.0* 22.7*  --  17.9*  INR 2.11* 2.08*  --  1.52*  CREATININE 0.73 0.84 0.87  --     Estimated Creatinine Clearance: 64.9 ml/min (by C-G formula based on Cr of 0.87).   Medical History: Past Medical History  Diagnosis Date  . CHF (congestive heart failure)   . Atrial fibrillation   . Diabetes mellitus without complication   . Hypertension   . Thyroid disease        Assessment: 80 YOF with history of Afib, now s/p cath and to start full-dose Lovenox 8 hours post sheath removal (removed at 1515 per report).  Patient did not receive any Lovenox pre-cath.  RN stated patient is negative for bleeding and hematoma.  Her renal function is appropriate for BID dosing of Lovenox.  Of note, she was on Coumadin per MD prior to cath and her INR was reversed to 1.52 today.   Goal of Therapy:  Anti-Xa level 0.6-1.2 units/ml 4hrs after LMWH dose given Monitor platelets by anticoagulation protocol: Yes    Plan:  - At 2330, start Lovenox 100mg  SQ Q12H - CBC Q72H while on Lovenox - Monitor closely for s/sx of bleeding - F/U with resuming Coumadin per MD     Chelsea Aus D. Laney Potash, PharmD, BCPS Pager:  [phone] 09/20/2013, 4:35 PM

## 2013-09-20 NOTE — CV Procedure (Signed)
PROCEDURE:  Right and Left heart catheterization with selective coronary angiography.  CLINICAL HISTORY:  This is a 72 years old female with exertional dyspnea, leg edema and congestive heart failure.  The risks, benefits, and details of the procedure were explained to the patient.  The patient verbalized understanding and wanted to proceed.  Informed written consent was obtained.  PROCEDURE TECHNIQUE:  The patient was approached from the right femoral artery using a 5 French short sheath and right femoral vein using a 7 [place] short sheath.  Right heart pressures and cardiac output were measured using Theone Murdoch catheter. Left coronary angiography was done using a Judkins L4 guide catheter.  Right coronary angiography was done using a Judkins R4 guide catheter.  Left ventriculography was not done to conserve dye use.    CONTRAST:  Total of 30 cc.  COMPLICATIONS:  None.  At the end of the procedure a manual device was used for hemostasis.    HEMODYNAMICS:  Aortic pressure was 141/71; LV pressure was 150/9; LVEDP 17.  There was no gradient between the left ventricle and aorta.  PA pressure was 61/35, Wedge-18, RV-66/6 and RA was 17. PA oxygen sat was 56 % and Ao sat was 89 %.  ANGIOGRAM/CORONARY ARTERIOGRAM:   The left main coronary artery is unremarkable.  The left anterior descending artery has osteal 20 % then luminal irregularities, mid vessel 30-40 % and distal 30 % stenosis. Four diagonals are narrow vessel with mild disease.  The left circumflex artery is large vessel with 3 OM branches. They all have  luminal irregularities.   The right coronary artery is dominant and has proximal luminal irregularities and mid vessel stable 70-80 % eccentric stenosis with good flow. Marginal branch Posterior descending artery are unremarkable.  LEFT VENTRICULOGRAM:  Left ventricular angiogram was done in the 30 RAO projection and revealed normal left ventricular wall motion and systolic function with an  estimated ejection fraction of 45-50%.  LVEDP was 17 mmHg.  IMPRESSION OF HEART CATHETERIZATION:   1. Normal left main coronary artery. 2. Mild disease of left anterior descending artery and its branches. 3. Mild disease of left circumflex artery and its branches. 4. 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 5. Normal left ventricular systolic function.  LVEDP 17 mmHg.  Ejection fraction 45%.  RECOMMENDATION:   RCA angioplasty in near future.

## 2013-09-20 NOTE — Care Management Note (Addendum)
  Page 2 of 2   09/20/2013     11:31:05 AM   CARE MANAGEMENT NOTE 09/20/2013  Patient:  Tracey [name], Tracey [name]   Account Number:  [id number]  Date Initiated:  09/19/2013  Documentation initiated by:  [facility]  Subjective/Objective Assessment:   CHF exacerbation/ home alone     Action/Plan:   diurese/ home with [facility]   Anticipated DC Date:  09/21/2013   Anticipated DC Plan:  HOME W HOME HEALTH SERVICES      DC Planning Services  CM consult      [facility] Choice  HOME HEALTH   Choice offered to / List presented to:  C-1 Patient        HH arranged  HH-1 RN  HH-10 DISEASE MANAGEMENT      HH agency  [facility]   Status of service:   Medicare Important Message given?   (If response is "NO", the following Medicare IM given date fields will be blank) Date Medicare IM given:   Date Additional Medicare IM given:    Discharge Disposition:    Per UR Regulation:  Reviewed for med. necessity/level of care/duration of stay  If discussed at Long Length of Stay Meetings, dates discussed:   09/20/2013    Comments:  09/20/13  Oletta Cohn, RN, BSN, NCM [phone] Spoke with pt at bedside regarding discharge planning for [facility]. Offered pt list of home health agencies to choose from.  Pt chose [place] to render services of RN. Corrie Dandy of [place] notified.  No DME needs identified at this time.

## 2013-09-21 LAB — BASIC METABOLIC PANEL
BUN: 24 mg/dL — ABNORMAL HIGH (ref 6–23)
CO2: 29 mEq/L (ref 19–32)
Calcium: 9.4 mg/dL (ref 8.4–10.5)
Creatinine, Ser: 0.88 mg/dL (ref 0.50–1.10)
GFR calc non Af Amer: 64 mL/min — ABNORMAL LOW (ref >90)
Sodium: 139 mEq/L (ref 135–145)

## 2013-09-21 LAB — PROTIME-INR: INR: 1.34 (ref 0.00–1.49)

## 2013-09-21 LAB — GLUCOSE, CAPILLARY
Glucose-Capillary: 161 mg/dL — ABNORMAL HIGH (ref 70–99)
Glucose-Capillary: 154 mg/dL — ABNORMAL HIGH (ref 70–99)
Glucose-Capillary: 189 mg/dL — ABNORMAL HIGH (ref 70–99)

## 2013-09-21 MED ORDER — SODIUM CHLORIDE 0.9 % IJ SOLN: 3.0000 mL | INTRAMUSCULAR | Status: DC | PRN

## 2013-09-21 MED ORDER — TICAGRELOR 90 MG PO TABS
90.0000 mg | ORAL_TABLET | Freq: Two times a day (BID) | ORAL | Status: DC
  Administered 2013-09-21 – 2013-09-22 (×2): 90 mg via ORAL
  Filled 2013-09-21 (×5): qty 1

## 2013-09-21 MED ORDER — ASPIRIN 81 MG PO CHEW
81.0000 mg | CHEWABLE_TABLET | ORAL | Status: AC
  Administered 2013-09-22: 81 mg via ORAL
  Filled 2013-09-21: qty 1

## 2013-09-21 MED ORDER — TICAGRELOR 90 MG PO TABS
90.0000 mg | ORAL_TABLET | Freq: Two times a day (BID) | ORAL | Status: AC
Start: 2013-09-21 — End: 2013-09-21
  Administered 2013-09-21: 90 mg via ORAL
  Filled 2013-09-21: qty 1

## 2013-09-21 MED ORDER — SODIUM CHLORIDE 0.9 % IV SOLN
1.0000 mL/kg/h | INTRAVENOUS | Status: DC
  Administered 2013-09-22 (×2): 1 mL/kg/h via INTRAVENOUS

## 2013-09-21 MED ORDER — SODIUM CHLORIDE 0.9 % IJ SOLN
3.0000 mL | Freq: Two times a day (BID) | INTRAMUSCULAR | Status: DC
  Administered 2013-09-21: 22:00:00 3 mL via INTRAVENOUS

## 2013-09-21 MED ORDER — ASPIRIN 81 MG PO CHEW
81.0000 mg | CHEWABLE_TABLET | Freq: Every day | ORAL | Status: DC
  Administered 2013-09-21 – 2013-09-24 (×3): 81 mg via ORAL
  Filled 2013-09-21 (×4): qty 1

## 2013-09-21 MED ORDER — SODIUM CHLORIDE 0.9 % IV SOLN: 250.0000 mL | INTRAVENOUS | Status: DC | PRN

## 2013-09-21 MED ORDER — DIAZEPAM 5 MG PO TABS
5.0000 mg | ORAL_TABLET | ORAL | Status: AC
  Administered 2013-09-22: 07:00:00 5 mg via ORAL
  Filled 2013-09-21: qty 1

## 2013-09-21 MED ORDER — WARFARIN SODIUM 10 MG PO TABS
10.0000 mg | ORAL_TABLET | Freq: Once | ORAL | Status: DC
Start: 2013-09-21 — End: 2013-09-21
  Filled 2013-09-21: qty 1

## 2013-09-21 MED ORDER — HEPARIN (PORCINE) IN NACL 100-0.45 UNIT/ML-% IJ SOLN
1050.0000 [IU]/h | INTRAMUSCULAR | Status: DC
  Administered 2013-09-21: 21:00:00 1050 [IU]/h via INTRAVENOUS
  Filled 2013-09-21 (×2): qty 250

## 2013-09-21 NOTE — Progress Notes (Signed)
ANTICOAGULATION CONSULT NOTE - Initial Consult  Pharmacy Consult: Heparin Indication: atrial fibrillation  Allergies  Allergen Reactions  . Codeine Nausea And Vomiting    Patient Measurements: Height: 5\' 3"  (160 cm) Weight: 208 lb 5.4 oz (94.5 kg) IBW/kg (Calculated) : 52.4 Heparin dosing weight 74 kg  Vital Signs: Temp: 98.1 F (36.7 C) (11/05 0529) BP: 117/69 mmHg (11/05 0529) Pulse Rate: 82 (11/05 0529)  Labs:  Recent Labs  09/19/13 0415 09/19/13 2105 09/20/13 0500 09/21/13 0357  HGB 12.2 12.2  --   --   HCT 38.0 40.6  --   --   PLT 383 356  --   --   LABPROT 22.7*  --  17.9* 16.3*  INR 2.08*  --  1.52* 1.34  CREATININE 0.84 0.87  --  0.88    Estimated Creatinine Clearance: 63.1 ml/min (by C-G formula based on Cr of 0.88).   Medical History: Past Medical History  Diagnosis Date  . CHF (congestive heart failure)   . Atrial fibrillation   . Diabetes mellitus without complication   . Hypertension   . Thyroid disease      Assessment: 81 YOF with history of Afib, now s/p cath and to start full-dose Lovenox 8 hours post sheath removal (received dose at midnight, then 1120). Now plan for PCI tomorrow. Pharmacy is consulted to change lovenox to IV heparin.  Noted Pt. Is also on coumadin per MD dosing, INR reversed with Vit K 10mg  PO on 11/3 evening, INR 1.52 > 1.34, coumadin 10 mg ordered last night, given @ 0015 this monring, Discussed with Dr. Sharyn Lull, will d/c coumadin.   Goal of Therapy:  Anti - Xa level 0.3 - 0.7 Monitor platelets by anticoagulation protocol: Yes    Plan:  - D/C lovneox - Start IV heparin with no bolus 1050 units/hr at 2030 tonight - AM heparin level and CBC or f/u after cath - F/U with resuming Coumadin per MD - d/c coumadin order for tonight  Bayard Hugger, PharmD, BCPS  Clinical Pharmacist  Pager: [phone]   09/21/2013, 12:32 PM

## 2013-09-21 NOTE — Progress Notes (Signed)
Subjective:  Patient denies any chest pain at present states breathing has improved. Reviewed left cardiac cath films from yesterday which showed 75-80% mid RCA eccentric stenosis.  Objective:  Vital Signs in the last 24 hours: Temp:  [98 F (36.7 C)-98.2 F (36.8 C)] 98.1 F (36.7 C) (11/05 0529) Pulse Rate:  [67-98] 82 (11/05 0529) Resp:  [18-19] 18 (11/05 0529) BP: (103-153)/(57-78) 117/69 mmHg (11/05 0529) SpO2:  [95 %] 95 % (11/04 2031) Weight:  [94.5 kg (208 lb 5.4 oz)] 94.5 kg (208 lb 5.4 oz) (11/05 0529)  Intake/Output from previous day: 11/04 0701 - 11/05 0700 In: 470 [P.O.:240; I.V.:230] Out: 1550 [Urine:1550] Intake/Output from this shift: Total I/O In: 240 [P.O.:240] Out: 200 [Urine:200]  Physical Exam: Neck: no adenopathy, no carotid bruit, no JVD and supple, symmetrical, trachea midline Lungs: Decreased breath sound at bases Heart: irregularly irregular rhythm, S1, S2 normal and Soft systolic murmur noted Abdomen: soft, non-tender; bowel sounds normal; no masses,  no organomegaly Extremities: No clubbing cyanosis 1+ edema noted. Right groin surgical dressing dry  Lab Results:  Recent Labs  09/19/13 0415 09/19/13 2105  WBC 10.2 10.6*  HGB 12.2 12.2  PLT 383 356    Recent Labs  09/19/13 2105 09/21/13 0357  NA 137 139  K 3.8 4.2  CL 95* 100  CO2 30 29  GLUCOSE 161* 136*  BUN 24* 24*  CREATININE 0.87 0.88   No results found for this basename: TROPONINI, CK, MB,  in the last 72 hours Hepatic Function Panel No results found for this basename: PROT, ALBUMIN, AST, ALT, ALKPHOS, BILITOT, BILIDIR, IBILI,  in the last 72 hours No results found for this basename: CHOL,  in the last 72 hours No results found for this basename: PROTIME,  in the last 72 hours  Imaging: Imaging results have been reviewed and No results found.  Cardiac Studies:  Assessment/Plan:  Resolving Decompensated acute systolic heart failure  Exertional dyspnea probably angina  equivalent status post left cath CAD Chronic atrial fibrillation  Hypertension  Diabetes mellitus  Status post hypoglycemia  Morbid obesity  Hypercholesteremia  Hypothyroidism Plan Discussed with patient regarding PTCA stenting to mid RCA is risk and benefits i.e. death MI stroke need for emergency CABG local vascular complications risk of restenosis , risk of bleeding etc. And consented for PCI  LOS: 6 days    Tracey [name] 09/21/2013, 11:49 AM

## 2013-09-22 ENCOUNTER — Encounter (HOSPITAL_COMMUNITY)
Admission: EM | Disposition: A | Discharge status: Home / Self Care | Payer: Self-pay | Source: Home / Self Care | Attending: Cardiovascular Disease

## 2013-09-22 DIAGNOSIS — M79609 Pain in unspecified limb: Secondary | ICD-10-CM

## 2013-09-22 HISTORY — PX: PERCUTANEOUS CORONARY STENT INTERVENTION (PCI-S): SHX5485

## 2013-09-22 LAB — GLUCOSE, CAPILLARY
Glucose-Capillary: 193 mg/dL — ABNORMAL HIGH (ref 70–99)
Glucose-Capillary: 324 mg/dL — ABNORMAL HIGH (ref 70–99)
Glucose-Capillary: 189 mg/dL — ABNORMAL HIGH (ref 70–99)
Glucose-Capillary: 243 mg/dL — ABNORMAL HIGH (ref 70–99)

## 2013-09-22 LAB — PROTIME-INR
INR: 1.14 (ref 0.00–1.49)
Prothrombin Time: 14.4 seconds (ref 11.6–15.2)

## 2013-09-22 LAB — CBC
MCH: 26.8 pg (ref 26.0–34.0)
MCH: 26.9 pg (ref 26.0–34.0)
MCHC: 32 g/dL (ref 30.0–36.0)
MCHC: 32.2 g/dL (ref 30.0–36.0)
MCV: 83.8 fL (ref 78.0–100.0)
Platelets: 375 10*3/uL (ref 150–400)
Platelets: 363 10*3/uL (ref 150–400)
RBC: 5.19 MIL/uL — ABNORMAL HIGH (ref 3.87–5.11)
RDW: 14.9 % (ref 11.5–15.5)
RDW: 15 % (ref 11.5–15.5)

## 2013-09-22 LAB — HEPARIN LEVEL (UNFRACTIONATED): Heparin Unfractionated: 0.32 [IU]/mL (ref 0.30–0.70)

## 2013-09-22 SURGERY — PERCUTANEOUS CORONARY STENT INTERVENTION (PCI-S)
Anesthesia: LOCAL

## 2013-09-22 MED ORDER — CEFAZOLIN SODIUM 1 G IJ SOLR
1.0000 g | Freq: Three times a day (TID) | INTRAMUSCULAR | Status: DC
  Filled 2013-09-22 (×3): qty 10

## 2013-09-22 MED ORDER — OXYCODONE-ACETAMINOPHEN 5-325 MG PO TABS
1.0000 | ORAL_TABLET | Freq: Once | ORAL | Status: AC
  Administered 2013-09-22: 1 via ORAL
  Filled 2013-09-22: qty 1

## 2013-09-22 MED ORDER — FUROSEMIDE 10 MG/ML IJ SOLN
INTRAMUSCULAR | Status: AC
  Filled 2013-09-22: qty 4

## 2013-09-22 MED ORDER — LIDOCAINE HCL (PF) 1 % IJ SOLN
INTRAMUSCULAR | Status: AC
  Filled 2013-09-22: qty 30

## 2013-09-22 MED ORDER — CEFAZOLIN SODIUM 1-5 GM-% IV SOLN
1.0000 g | Freq: Three times a day (TID) | INTRAVENOUS | Status: AC
  Administered 2013-09-22 – 2013-09-23 (×3): 1 g via INTRAVENOUS
  Filled 2013-09-22 (×3): qty 50

## 2013-09-22 MED ORDER — NITROGLYCERIN 0.2 MG/ML ON CALL CATH LAB
INTRAVENOUS | Status: AC
  Filled 2013-09-22: qty 1

## 2013-09-22 MED ORDER — FUROSEMIDE 10 MG/ML IJ SOLN
40.0000 mg | Freq: Once | INTRAMUSCULAR | Status: AC
  Administered 2013-09-22: 40 mg via INTRAVENOUS

## 2013-09-22 MED ORDER — FENTANYL CITRATE 0.05 MG/ML IJ SOLN
INTRAMUSCULAR | Status: AC
  Filled 2013-09-22: qty 2

## 2013-09-22 MED ORDER — SODIUM CHLORIDE 0.9 % IV SOLN: INTRAVENOUS | Status: DC

## 2013-09-22 MED ORDER — HEPARIN (PORCINE) IN NACL 2-0.9 UNIT/ML-% IJ SOLN
INTRAMUSCULAR | Status: AC
  Filled 2013-09-22: qty 1500

## 2013-09-22 MED ORDER — ATROPINE SULFATE 0.1 MG/ML IJ SOLN
INTRAMUSCULAR | Status: AC
  Filled 2013-09-22: qty 10

## 2013-09-22 NOTE — Progress Notes (Signed)
PT Cancellation Note  Patient Details Name: Tracey [name] MRN: [id number] DOB: [birth year]   Cancelled Treatment:    Reason Eval/Treat Not Completed: Medical issues which prohibited therapy (pt on bedrest)   [name] 09/22/2013, 12:51 PM [name], PT [phone]

## 2013-09-22 NOTE — Progress Notes (Signed)
Right femoral artery duplex completed.  No evidence of pseudoaneurysm.  There appears to be an hematoma.

## 2013-09-22 NOTE — Progress Notes (Signed)
Subjective:  Patient started with right groin bleed 12 hour post procedure ( after resumption of heparin and aspirin and Brilinta). Ultrasound negative for pseudoaneurysm.  Objective:  Vital Signs in the last 24 hours: Temp:  [97.3 F (36.3 C)-98.5 F (36.9 C)] 97.3 F (36.3 C) (11/06 0425) Pulse Rate:  [70-91] 70 (11/06 0425) Cardiac Rhythm:  [-] Atrial fibrillation (11/05 2052) Resp:  [18-19] 18 (11/06 0425) BP: (100-156)/(57-93) 150/76 mmHg (11/06 0425) SpO2:  [96 %-97 %] 97 % (11/06 0425) Weight:  [93 kg (205 lb 0.4 oz)] 93 kg (205 lb 0.4 oz) (11/06 0425)  Physical Exam: BP Readings from Last 1 Encounters:  09/22/13 150/76     Wt Readings from Last 1 Encounters:  09/22/13 93 kg (205 lb 0.4 oz)    Weight change: -1.5 kg (-3 lb 4.9 oz)  HEENT: [place]/AT, Eyes-Light brown, bil.lens implants. PERL, EOMI, Conjunctiva-Pink, Sclera-Non-icteric. Neck: No JVD, No bruit, Trachea midline. Lungs:  Clear, Bilateral. Cardiac:  Regular rhythm, normal S1 and S2, no S3.  Abdomen:  Soft, non-tender. Extremities:  No edema present. No cyanosis. No clubbing. Right groin hematoma with ecchymosis. CNS: AxOx3, Cranial nerves grossly intact, moves all 4 extremities. Right handed. Skin: Warm and dry.   Intake/Output from previous day: 11/05 0701 - 11/06 0700 In: 940 [P.O.:940] Out: 1726 [Urine:1725; Stool:1]    Lab Results: BMET    Component Value Date/Time   NA 139 09/21/2013 0357   K 4.2 09/21/2013 0357   CL 100 09/21/2013 0357   CO2 29 09/21/2013 0357   GLUCOSE 136* 09/21/2013 0357   BUN 24* 09/21/2013 0357   CREATININE 0.88 09/21/2013 0357   CALCIUM 9.4 09/21/2013 0357   GFRNONAA 64* 09/21/2013 0357   GFRAA 74* 09/21/2013 0357   CBC    Component Value Date/Time   WBC 13.6* 09/22/2013 0525   RBC 5.19* 09/22/2013 0525   HGB 13.9 09/22/2013 0525   HCT 43.5 09/22/2013 0525   PLT 375 09/22/2013 0525   MCV 83.8 09/22/2013 0525   MCH 26.8 09/22/2013 0525   MCHC 32.0 09/22/2013 0525   RDW 14.9  09/22/2013 0525   CARDIAC ENZYMES No results found for this basename: CKTOTAL, CKMB, CKMBINDEX, TROPONINI    Scheduled Meds: . [facility] HOLD] antiseptic oral rinse  15 mL Mouth Rinse BID  . [facility] HOLD] aspirin  81 mg Oral Daily  . [facility] HOLD] digoxin  0.25 mg Oral Daily  . [MAR HOLD] escitalopram  20 mg Oral Daily  . [facility] HOLD] furosemide  40 mg Intravenous Q12H  . [MAR HOLD] insulin aspart  0-15 Units Subcutaneous TID WC  . [MAR HOLD] insulin glargine  6 Units Subcutaneous QHS  . [facility] HOLD] levothyroxine  75 mcg Oral QAC breakfast  . [MAR HOLD] metoprolol succinate  25 mg Oral QPM  . [MAR HOLD] multivitamin with minerals  1 tablet Oral Daily  . oxyCODONE-acetaminophen  1 tablet Oral Once  . [facility] HOLD] potassium chloride  20 mEq Oral BID  . [MAR HOLD] ramipril  5 mg Oral Daily  . [MAR HOLD] sodium chloride  3 mL Intravenous Q12H  . [facility] HOLD] spironolactone  25 mg Oral Daily   Continuous Infusions: . heparin 1,050 Units/hr (09/21/13 2114)   PRN Meds:.[[facility] HOLD] sodium chloride, [MAR HOLD] acetaminophen, [MAR HOLD] ALPRAZolam, [MAR HOLD] dextrose, [MAR HOLD] glucose-Vitamin C, [MAR HOLD] ondansetron (ZOFRAN) IV, [MAR HOLD] sodium chloride  Assessment/Plan: Large right groin hematoma, pseudoaneurysm ruled out CAD Acute left heart systolic failure, resolving Hypertension  DM,  II  Obesity  Hyperlipidemia  Atrial fibrillation  Asthma, chronic  Hypothyroidism  Bed rest/Check CBC/DC heparin and Brilinta/Analgesic as needed.   LOS: 7 days    Orpah Cobb  MD  09/22/2013, 9:33 AM

## 2013-09-22 NOTE — Progress Notes (Signed)
Chart review complete.  Patient is not eligible for [facility] Care Management services because his/her PCP is not a [facility] primary care provider or is not [facility] affiliated.  For any additional questions or new referrals please contact Tim Henderson BSN RN [facility] Liaison at [phone] °

## 2013-09-22 NOTE — Progress Notes (Signed)
PT Cancellation Note  Patient Details Name: Tracey [name] MRN: [id number] DOB: [birth year]   Cancelled Treatment:    Reason Eval/Treat Not Completed: Patient at procedure or test/unavailable (pt in cath lab)   [name] 09/22/2013, 9:31 AM [name], PT [phone]

## 2013-09-22 NOTE — Progress Notes (Signed)
Received call from pharmacy verifying  Anticoagulant tx, called to Dr Algie Coffer stated will hold anticoagulant ncluding coumadin. SCD ordered for VTE prophylaxis. Riki Rusk Pharmacist made aware.

## 2013-09-22 NOTE — Progress Notes (Signed)
Subjective:  Patient seen in the Cath Lab and large hematoma in the right groin. Complains of pain in the right groin. Discussed with Dr. Algie Coffer about large hematoma will cancel the procedure today and schedule her as outpatient in few weeks. Discussed also with patient and agrees.  Objective:  Vital Signs in the last 24 hours: Temp:  [97.3 F (36.3 C)-98.5 F (36.9 C)] 97.3 F (36.3 C) (11/06 0425) Pulse Rate:  [70-91] 70 (11/06 0425) Resp:  [18-19] 18 (11/06 0425) BP: (100-156)/(57-93) 150/76 mmHg (11/06 0425) SpO2:  [96 %-97 %] 97 % (11/06 0425) Weight:  [93 kg (205 lb 0.4 oz)] 93 kg (205 lb 0.4 oz) (11/06 0425)  Intake/Output from previous day: 11/05 0701 - 11/06 0700 In: 940 [P.O.:940] Out: 1726 [Urine:1725; Stool:1] Intake/Output from this shift:    Physical Exam: Large hematoma with ecchymosis right groin no obvious bruit heard  Lab Results:  Recent Labs  09/19/13 2105 09/22/13 0525  WBC 10.6* 13.6*  HGB 12.2 13.9  PLT 356 375    Recent Labs  09/19/13 2105 09/21/13 0357  NA 137 139  K 3.8 4.2  CL 95* 100  CO2 30 29  GLUCOSE 161* 136*  BUN 24* 24*  CREATININE 0.87 0.88   No results found for this basename: TROPONINI, CK, MB,  in the last 72 hours Hepatic Function Panel No results found for this basename: PROT, ALBUMIN, AST, ALT, ALKPHOS, BILITOT, BILIDIR, IBILI,  in the last 72 hours No results found for this basename: CHOL,  in the last 72 hours No results found for this basename: PROTIME,  in the last 72 hours  Imaging: Imaging results have been reviewed and No results found.  Cardiac Studies:  Assessment/Plan:  Large hematoma right groin rule out pseudoaneurysm Resolving Decompensated acute systolic heart failure  Exertional dyspnea probably angina equivalent status post left cath / CAD  Chronic atrial fibrillation  Hypertension  Diabetes mellitus  Status post hypoglycemia  Morbid obesity  Hypercholesteremia  Hypothyroidism   Plan Cancel PCI. Duplex ultrasound right groin. Rule out pseudoaneurysm DC heparin and DC Brilinta Check CBC Discuss with Dr. Algie Coffer  LOS: 7 days    Robynn Pane 09/22/2013, 8:01 AM

## 2013-09-23 LAB — CBC
HCT: 39 % (ref 36.0–46.0)
MCHC: 31.8 g/dL (ref 30.0–36.0)
RBC: 4.64 MIL/uL (ref 3.87–5.11)
RDW: 15.2 % (ref 11.5–15.5)
WBC: 15.1 10*3/uL — ABNORMAL HIGH (ref 4.0–10.5)

## 2013-09-23 LAB — PROTIME-INR
INR: 1.24 (ref 0.00–1.49)
Prothrombin Time: 15.3 seconds — ABNORMAL HIGH (ref 11.6–15.2)

## 2013-09-23 LAB — GLUCOSE, CAPILLARY
Glucose-Capillary: 225 mg/dL — ABNORMAL HIGH (ref 70–99)
Glucose-Capillary: 261 mg/dL — ABNORMAL HIGH (ref 70–99)

## 2013-09-23 MED ORDER — DIGOXIN 125 MCG PO TABS
0.1250 mg | ORAL_TABLET | Freq: Every day | ORAL | Status: DC
  Administered 2013-09-23 – 2013-09-24 (×2): 0.125 mg via ORAL
  Filled 2013-09-23 (×2): qty 1

## 2013-09-23 MED ORDER — DILTIAZEM HCL 30 MG PO TABS
30.0000 mg | ORAL_TABLET | Freq: Two times a day (BID) | ORAL | Status: DC
  Administered 2013-09-23 – 2013-09-24 (×3): 30 mg via ORAL
  Filled 2013-09-23 (×6): qty 1

## 2013-09-23 MED ORDER — METOPROLOL SUCCINATE ER 50 MG PO TB24
50.0000 mg | ORAL_TABLET | Freq: Every evening | ORAL | Status: DC
  Filled 2013-09-23 (×2): qty 1

## 2013-09-23 MED ORDER — INSULIN GLARGINE 100 UNIT/ML ~~LOC~~ SOLN
10.0000 [IU] | Freq: Every day | SUBCUTANEOUS | Status: DC
  Administered 2013-09-23: 10 [IU] via SUBCUTANEOUS
  Filled 2013-09-23 (×2): qty 0.1

## 2013-09-23 NOTE — Progress Notes (Signed)
Subjective:  Large hematoma persist. Ambulates with some unsteadiness.  Objective:  Vital Signs in the last 24 hours: Temp:  [97.1 F (36.2 C)-98.5 F (36.9 C)] 97.7 F (36.5 C) (11/07 0809) Pulse Rate:  [78-116] 102 (11/07 0809) Cardiac Rhythm:  [-] Atrial fibrillation (11/07 0433) Resp:  [16-22] 20 (11/07 0809) BP: (100-161)/(56-97) 100/62 mmHg (11/07 0809) SpO2:  [93 %-100 %] 95 % (11/07 0809) Weight:  [94.1 kg (207 lb 7.3 oz)] 94.1 kg (207 lb 7.3 oz) (11/07 0500)  Physical Exam: BP Readings from Last 1 Encounters:  09/23/13 100/62     Wt Readings from Last 1 Encounters:  09/23/13 94.1 kg (207 lb 7.3 oz)    Weight change: 1.1 kg (2 lb 6.8 oz)  HEENT: [place]/AT, Eyes-Light brown with bilateral lens implants, PERL, EOMI, Conjunctiva-Pink, Sclera-Non-icteric Neck: No JVD, No bruit, Trachea midline. Lungs:  Clear, Bilateral. Cardiac:  Regular rhythm, normal S1 and S2, no S3.  Abdomen:  Soft, non-tender. Extremities:  No edema present. No cyanosis. No clubbing.  CNS: AxOx3, Cranial nerves grossly intact, moves all 4 extremities. Right handed. Skin: Warm and dry.   Intake/Output from previous day: 11/06 0701 - 11/07 0700 In: 870 [P.O.:720; IV Piggyback:150] Out: 2300 [Urine:2300]    Lab Results: BMET    Component Value Date/Time   NA 139 09/21/2013 0357   K 4.2 09/21/2013 0357   CL 100 09/21/2013 0357   CO2 29 09/21/2013 0357   GLUCOSE 136* 09/21/2013 0357   BUN 24* 09/21/2013 0357   CREATININE 0.88 09/21/2013 0357   CALCIUM 9.4 09/21/2013 0357   GFRNONAA 64* 09/21/2013 0357   GFRAA 74* 09/21/2013 0357   CBC    Component Value Date/Time   WBC 15.1* 09/23/2013 0520   RBC 4.64 09/23/2013 0520   HGB 12.4 09/23/2013 0520   HCT 39.0 09/23/2013 0520   PLT 356 09/23/2013 0520   MCV 84.1 09/23/2013 0520   MCH 26.7 09/23/2013 0520   MCHC 31.8 09/23/2013 0520   RDW 15.2 09/23/2013 0520   CARDIAC ENZYMES No results found for this basename: CKTOTAL, CKMB, CKMBINDEX, TROPONINI     Scheduled Meds: . antiseptic oral rinse  15 mL Mouth Rinse BID  . aspirin  81 mg Oral Daily  . digoxin  0.25 mg Oral Daily  . escitalopram  20 mg Oral Daily  . furosemide  40 mg Intravenous Q12H  . insulin aspart  0-15 Units Subcutaneous TID WC  . insulin glargine  6 Units Subcutaneous QHS  . levothyroxine  75 mcg Oral QAC breakfast  . metoprolol succinate  25 mg Oral QPM  . multivitamin with minerals  1 tablet Oral Daily  . potassium chloride  20 mEq Oral BID  . ramipril  5 mg Oral Daily  . sodium chloride  3 mL Intravenous Q12H  . spironolactone  25 mg Oral Daily   Continuous Infusions:  PRN Meds:.sodium chloride, acetaminophen, ALPRAZolam, dextrose, glucose-Vitamin C, ondansetron (ZOFRAN) IV, sodium chloride  Assessment/Plan: Large right groin hematoma, pseudoaneurysm ruled out  CAD  Acute left heart systolic failure, resolving  Hypertension  DM, II  Obesity  Hyperlipidemia  Atrial fibrillation  Asthma, chronic  Hypothyroidism  Increase activity.    LOS: 8 days    Orpah Cobb  MD  09/23/2013, 9:04 AM

## 2013-09-23 NOTE — Care Management (Addendum)
[facility] following this patient for any HH needs upon discharge.  Thank you. Ayesha Rumpf RN, BSN [phone]

## 2013-09-23 NOTE — Progress Notes (Signed)
Physical Therapy Treatment Patient Details Name: Tracey [name] MRN: [id number] DOB: 07/05/1941 Today's Date: 09/23/2013 Time: 4098-1191 PT Time Calculation (min): 21 min  PT Assessment / Plan / Recommendation  History of Present Illness 72 y.o. female admitted to [facility] on 09/15/13 with LE edema and difficulty breathing.  Dx with CHF exacerbation.     PT Comments   Pt able to ambulate in hallway; became tachy when ambulating ~152ft handheld (A). Pt slightly unsteady with gt today and reaching for external railing. Pt encouraged to amb with RW upon D/C home; pt agreeable. Pt and MD report pt will amb x3 around hallways today. Will cont to follow per POC.   Follow Up Recommendations  No PT follow up     Does the patient have the potential to tolerate intense rehabilitation     Barriers to Discharge        Equipment Recommendations  None recommended by PT    Recommendations for Other Services    Frequency Min 3X/week   Progress towards PT Goals Progress towards PT goals: Progressing toward goals  Plan Current plan remains appropriate    Precautions / Restrictions Precautions Precautions: Fall Precaution Comments: monitor O2 sats with mobility, needs to use the bathroom before gait- or wear pads/depends.   Restrictions Weight Bearing Restrictions: No   Pertinent Vitals/Pain No c/o pain; see VSS     Mobility  Bed Mobility Bed Mobility: Sit to Supine;Supine to Sit Supine to Sit: 6: Modified independent (Device/Increase time);HOB elevated Sit to Supine: 6: Modified independent (Device/Increase time) Details for Bed Mobility Assistance: pt requires incr time and uses handrails for support  Transfers Transfers: Sit to Stand;Stand to Sit Sit to Stand: 5: Supervision;From bed Stand to Sit: 5: Supervision;To bed Details for Transfer Assistance: pt slightly unsteady with transfers today; supervision for safety and cues for hand placement; pt attempted to pull up from sliding table   Ambulation/Gait Ambulation/Gait Assistance: 4: Min guard Ambulation Distance (Feet): 100 Feet Assistive device: 1 person hand held assist Ambulation/Gait Assistance Details: pt required handheld (A) today; became tachy 130 BPM per RN; pt demo SOB amb on RA and was at 87% when returned to room; pt  was motivated to increase ambulation; slightly unsteady today; encouraged to amb with RW upon D/C home to increase stability; pt agreeable; pt reaching for railing along side wall during ambulation; cues for safety  Gait Pattern: Step-through pattern;Decreased stride length General Gait Details: pt demo SOB with ambulation  Stairs: No Wheelchair Mobility Wheelchair Mobility: No    Exercises General Exercises - Lower Extremity Ankle Circles/Pumps: AROM;Both;10 reps;Seated   PT Diagnosis:    PT Problem List:   PT Treatment Interventions:     PT Goals (current goals can now be found in the care plan section) Acute Rehab PT Goals Patient Stated Goal: to get home today  PT Goal Formulation: With patient Time For Goal Achievement: 09/30/13 Potential to Achieve Goals: Good  Visit Information  Last PT Received On: 09/23/13 Assistance Needed: +1 History of Present Illness: 72 y.o. female admitted to [facility] on 09/15/13 with LE edema and difficulty breathing.  Dx with CHF exacerbation.      Subjective Data  Subjective: pt sitting EOB; agreeable to therapy; states "i sure hope i can go home today"  Patient Stated Goal: to get home today    Cognition  Cognition Arousal/Alertness: Awake/alert Behavior During Therapy: WFL for tasks assessed/performed Overall Cognitive Status: Within Functional Limits for tasks assessed    Balance  Balance Balance Assessed: Yes Static Standing Balance Static Standing - Balance Support: Left upper extremity supported;During functional activity Static Standing - Level of Assistance: Other (comment) (min guard )  End of Session PT - End of Session Equipment  Utilized During Treatment: Gait belt Activity Tolerance: Patient tolerated treatment well Patient left: in bed;with bed alarm set Nurse Communication: Mobility status   GP     Donell Sievert, [place] [phone] 09/23/2013, 10:14 AM

## 2013-09-24 LAB — BASIC METABOLIC PANEL
BUN: 25 mg/dL — ABNORMAL HIGH (ref 6–23)
CO2: 27 mEq/L (ref 19–32)
Calcium: 9 mg/dL (ref 8.4–10.5)
Chloride: 100 mEq/L (ref 96–112)
Creatinine, Ser: 0.63 mg/dL (ref 0.50–1.10)
GFR calc Af Amer: >90 mL/min (ref >90)
Glucose, Bld: 189 mg/dL — ABNORMAL HIGH (ref 70–99)

## 2013-09-24 LAB — CBC
HCT: 38 % (ref 36.0–46.0)
Hemoglobin: 11.7 g/dL — ABNORMAL LOW (ref 12.0–15.0)
MCH: 25.6 pg — ABNORMAL LOW (ref 26.0–34.0)
MCV: 83.2 fL (ref 78.0–100.0)
Platelets: 325 10*3/uL (ref 150–400)
RBC: 4.57 MIL/uL (ref 3.87–5.11)
WBC: 13.4 10*3/uL — ABNORMAL HIGH (ref 4.0–10.5)

## 2013-09-24 LAB — GLUCOSE, CAPILLARY

## 2013-09-24 MED ORDER — FUROSEMIDE 40 MG PO TABS: 40.0000 mg | ORAL_TABLET | Freq: Every day | ORAL | Status: DC

## 2013-09-24 MED ORDER — DIGOXIN 125 MCG PO TABS: 0.1250 mg | ORAL_TABLET | Freq: Every day | ORAL | Status: DC

## 2013-09-24 MED ORDER — ASPIRIN 81 MG PO CHEW: 81.0000 mg | CHEWABLE_TABLET | Freq: Every day | ORAL | Status: DC

## 2013-09-24 MED ORDER — METOPROLOL TARTRATE 25 MG PO TABS: 25.0000 mg | ORAL_TABLET | Freq: Two times a day (BID) | ORAL | Status: DC

## 2013-09-24 MED ORDER — SPIRONOLACTONE 25 MG PO TABS: 25.0000 mg | ORAL_TABLET | Freq: Every day | ORAL | Status: DC

## 2013-09-24 MED ORDER — LEVOTHYROXINE SODIUM 75 MCG PO TABS: 75.0000 ug | ORAL_TABLET | Freq: Every day | ORAL | Status: DC

## 2013-09-24 NOTE — Discharge Summary (Signed)
Physician Discharge Summary  Patient ID: Tracey [name] MRN: [id number] DOB/AGE: 72-12-1940 72 y.o.  Admit date: 09/15/2013 Discharge date: 09/24/2013  Admission Diagnoses: Acute left heart systolic failure Hypertension  DM, II  Obesity  Hyperlipidemia  Atrial fibrillation  Asthma, chronic  Hypothyroidism  Discharge Diagnoses:  Principle Problem: * Acute left heart systolic failure * Multivessel native vessel coronary  Large right groin hematoma, secondary to anticoagulant excess Pseudoaneurysm ruled out  CAD   Hypertension  DM, II  Obesity  Hyperlipidemia  Chronic, atrial fibrillation  Mild anemia of blood loss Asthma, chronic  Hypothyroidism  Discharged Condition: fair  Hospital Course: 72 years old female had exertional and non-exertional shortness of breath with a dry cough x 3 days along with fuid buildup in her lower extremities x 2 weeks. She responded well with IV lasix. She then underwent right and left heart catheterization which revealed significant RCA stenosis. She had large right groin hematoma 12 hour post procedure due to anticoagulant excess. Her RCA angioplasty was postponed.  Her Hgb and right groin hematoma were monitored for 48 hours. She had mild anemia of blood loss. Her coumadin was discontinued, aspirin was started and oral diabetic medications were resumed. She will resume coumadin in 2 days and will have follow up in 1 week or less as needed.  Consults: cardiology  Significant Diagnostic Studies: labs: Near normal CBC, BMET except sugar of 180 mg/dl. Elevated Pro BNP of 1567. INR down to 1.17 from 3.43  Chest X-ray: Cardiomegaly, left atrial enlargement and pulmonary vascular congestion bordering on mild interstitial edema suggest mild/early CHF.  Echocardiogram: Left ventricle: The cavity size was normal. There was moderate concentric hypertrophy. Systolic function was mildly reduced. The estimated ejection fraction was in the range of 45% to 50%.  There is mild hypokinesis of the anterior myocardium. - Mitral valve: Calcified annulus. Mild regurgitation. - Left atrium: The atrium was mildly dilated. - Right atrium: The atrium was mildly dilated. - Tricuspid valve: Moderate regurgitation. - Pulmonary arteries: Systolic pressure was moderately increased. PA peak pressure: 50mm Hg (S).  Right lower extremity Duplex:- No evidence of pseudoaneurysm. No evidence of arteriovenous fistula. There appears to be an hematoma.  EKG-Atrial fibrillation, anterior infarct, RBBB.  Right and left heart catheterization: Normal left main coronary artery. Mild disease of left anterior descending artery and its branches. Mild disease of left circumflex artery and its branches. 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 Normal left ventricular systolic function. LVEDP 17 mmHg. Ejection fraction 45%.  Treatments: cardiac meds: ramipril (Altace), metoprolol, diltiazem, digoxin and furosemide.   Discharge Exam: Blood pressure 129/73, pulse 84, temperature 97.7 F (36.5 C), temperature source Oral, resp. rate 18, height 5\' 3"  (1.6 m), weight 94 kg (207 lb 3.7 oz), SpO2 97.00%.  HEENT: [place]/AT, Eyes-Light brown with bilateral lens implants, PERL, EOMI, Conjunctiva-Pink, Sclera-Non-icteric  Neck: No JVD, No bruit, Trachea midline.  Lungs: Clear, Bilateral.  Cardiac: Irregular rhythm, normal S1 and S2, no S3. II/VI systolic murmur. Abdomen: Soft, non-tender.  Extremities: No edema present. No cyanosis. No clubbing. Large but decreasing right groin hematoma and ecchymosis. CNS: AxOx3, Cranial nerves grossly intact, moves all 4 extremities. Right handed.  Skin: Warm and dry.  Disposition: 01, Home, self care.     Medication List    STOP taking these medications       warfarin 7.5 MG tablet  Commonly known as:  COUMADIN      TAKE these medications       ALPRAZolam 0.5  MG tablet  Commonly known as:  XANAX  Take 0.5 mg by mouth at bedtime as  needed for sleep.     aspirin 81 MG chewable tablet  Chew 1 tablet (81 mg total) by mouth daily.     digoxin 0.125 MG tablet  Commonly known as:  LANOXIN  Take 1 tablet (0.125 mg total) by mouth daily.     diltiazem 30 MG tablet  Commonly known as:  CARDIZEM  Take 30 mg by mouth 2 (two) times daily.     escitalopram 20 MG tablet  Commonly known as:  LEXAPRO  Take 20 mg by mouth daily.     furosemide 40 MG tablet  Commonly known as:  LASIX  Take 1 tablet (40 mg total) by mouth daily.     glipiZIDE 10 MG tablet  Commonly known as:  GLUCOTROL  Take 10 mg by mouth 2 (two) times daily before a meal.     levothyroxine 75 MCG tablet  Commonly known as:  SYNTHROID, LEVOTHROID  Take 1 tablet (75 mcg total) by mouth daily before breakfast.     metFORMIN 500 MG tablet  Commonly known as:  GLUCOPHAGE  Take 1,000 mg by mouth 2 (two) times daily with a meal.     metoprolol tartrate 25 MG tablet  Commonly known as:  LOPRESSOR  Take 1 tablet (25 mg total) by mouth 2 (two) times daily.     multivitamin with minerals Tabs tablet  Take 1 tablet by mouth daily.     ramipril 5 MG capsule  Commonly known as:  ALTACE  Take 5 mg by mouth daily.     spironolactone 25 MG tablet  Commonly known as:  ALDACTONE  Take 1 tablet (25 mg total) by mouth daily.           Follow-up Information   Follow up with [facility], MD. Schedule an appointment as soon as possible for a visit in 1 week.   Specialty:  Cardiology   Contact information:   [address] [place] [address] [phone]       Signed: Ricki Rodriguez 09/24/2013, 10:13 AM

## 2013-10-25 ENCOUNTER — Encounter (HOSPITAL_COMMUNITY): Payer: Self-pay | Admitting: Pharmacy Technician

## 2013-10-27 ENCOUNTER — Other Ambulatory Visit: Payer: Self-pay

## 2013-10-27 ENCOUNTER — Ambulatory Visit (HOSPITAL_COMMUNITY)
Admission: RE | Admit: 2013-10-27 | Discharge: 2013-10-28 | Disposition: A | Discharge status: Home / Self Care | Payer: Medicare Other | Source: Ambulatory Visit | Attending: Cardiology | Admitting: Cardiology

## 2013-10-27 ENCOUNTER — Encounter (HOSPITAL_COMMUNITY)
Admission: RE | Disposition: A | Discharge status: Home / Self Care | Payer: Medicare Other | Source: Ambulatory Visit | Attending: Cardiology

## 2013-10-27 ENCOUNTER — Encounter (HOSPITAL_COMMUNITY): Payer: Self-pay | Admitting: General Practice

## 2013-10-27 DIAGNOSIS — I509 Heart failure, unspecified: Secondary | ICD-10-CM | POA: Insufficient documentation

## 2013-10-27 DIAGNOSIS — I4891 Unspecified atrial fibrillation: Secondary | ICD-10-CM | POA: Insufficient documentation

## 2013-10-27 DIAGNOSIS — E7800 Pure hypercholesterolemia, unspecified: Secondary | ICD-10-CM | POA: Insufficient documentation

## 2013-10-27 DIAGNOSIS — I10 Essential (primary) hypertension: Secondary | ICD-10-CM | POA: Insufficient documentation

## 2013-10-27 DIAGNOSIS — E039 Hypothyroidism, unspecified: Secondary | ICD-10-CM | POA: Insufficient documentation

## 2013-10-27 DIAGNOSIS — I249 Acute ischemic heart disease, unspecified: Secondary | ICD-10-CM

## 2013-10-27 DIAGNOSIS — I2510 Atherosclerotic heart disease of native coronary artery without angina pectoris: Secondary | ICD-10-CM | POA: Insufficient documentation

## 2013-10-27 DIAGNOSIS — E119 Type 2 diabetes mellitus without complications: Secondary | ICD-10-CM | POA: Insufficient documentation

## 2013-10-27 DIAGNOSIS — I209 Angina pectoris, unspecified: Secondary | ICD-10-CM | POA: Insufficient documentation

## 2013-10-27 DIAGNOSIS — Z955 Presence of coronary angioplasty implant and graft: Secondary | ICD-10-CM

## 2013-10-27 DIAGNOSIS — I5022 Chronic systolic (congestive) heart failure: Secondary | ICD-10-CM | POA: Insufficient documentation

## 2013-10-27 HISTORY — PX: PERCUTANEOUS CORONARY STENT INTERVENTION (PCI-S): SHX5485

## 2013-10-27 HISTORY — DX: Acute ischemic heart disease, unspecified: I24.9

## 2013-10-27 HISTORY — DX: Unspecified osteoarthritis, unspecified site: M19.90

## 2013-10-27 HISTORY — DX: Atherosclerotic heart disease of native coronary artery without angina pectoris: I25.10

## 2013-10-27 HISTORY — DX: Shortness of breath: R06.02

## 2013-10-27 HISTORY — DX: Hypothyroidism, unspecified: E03.9

## 2013-10-27 HISTORY — PX: CARDIAC CATHETERIZATION: SHX172

## 2013-10-27 HISTORY — DX: Anemia, unspecified: D64.9

## 2013-10-27 HISTORY — PX: CORONARY ANGIOPLASTY WITH STENT PLACEMENT: SHX49

## 2013-10-27 HISTORY — DX: Anxiety disorder, unspecified: F41.9

## 2013-10-27 LAB — GLUCOSE, CAPILLARY
Glucose-Capillary: 175 mg/dL — ABNORMAL HIGH (ref 70–99)
Glucose-Capillary: 157 mg/dL — ABNORMAL HIGH (ref 70–99)
Glucose-Capillary: 145 mg/dL — ABNORMAL HIGH (ref 70–99)
Glucose-Capillary: 145 mg/dL — ABNORMAL HIGH (ref 70–99)
Glucose-Capillary: 99 mg/dL (ref 70–99)

## 2013-10-27 LAB — POCT ACTIVATED CLOTTING TIME: Activated Clotting Time: 376 seconds

## 2013-10-27 SURGERY — PERCUTANEOUS CORONARY STENT INTERVENTION (PCI-S)
Anesthesia: LOCAL

## 2013-10-27 MED ORDER — NITROGLYCERIN 0.2 MG/ML ON CALL CATH LAB
INTRAVENOUS | Status: AC
  Filled 2013-10-27: qty 1

## 2013-10-27 MED ORDER — TICAGRELOR 90 MG PO TABS
90.0000 mg | ORAL_TABLET | Freq: Two times a day (BID) | ORAL | Status: DC
  Administered 2013-10-27 – 2013-10-28 (×2): 90 mg via ORAL
  Filled 2013-10-27 (×3): qty 1

## 2013-10-27 MED ORDER — ASPIRIN 81 MG PO CHEW
CHEWABLE_TABLET | ORAL | Status: AC
  Filled 2013-10-27: qty 1

## 2013-10-27 MED ORDER — ESCITALOPRAM OXALATE 20 MG PO TABS
20.0000 mg | ORAL_TABLET | Freq: Every day | ORAL | Status: DC
  Administered 2013-10-27 – 2013-10-28 (×2): 20 mg via ORAL
  Filled 2013-10-27 (×2): qty 1

## 2013-10-27 MED ORDER — DIGOXIN 125 MCG PO TABS
0.1250 mg | ORAL_TABLET | Freq: Every day | ORAL | Status: DC
  Administered 2013-10-27 – 2013-10-28 (×2): 0.125 mg via ORAL
  Filled 2013-10-27 (×2): qty 1

## 2013-10-27 MED ORDER — SODIUM CHLORIDE 0.9 % IV SOLN
INTRAVENOUS | Status: DC
  Administered 2013-10-27: 1000 mL via INTRAVENOUS

## 2013-10-27 MED ORDER — SODIUM CHLORIDE 0.9 % IV SOLN
0.2500 mg/kg/h | INTRAVENOUS | Status: AC
  Filled 2013-10-27: qty 250

## 2013-10-27 MED ORDER — ASPIRIN 81 MG PO CHEW
81.0000 mg | CHEWABLE_TABLET | ORAL | Status: AC
  Administered 2013-10-27: 81 mg via ORAL

## 2013-10-27 MED ORDER — INSULIN ASPART 100 UNIT/ML ~~LOC~~ SOLN
0.0000 [IU] | Freq: Three times a day (TID) | SUBCUTANEOUS | Status: DC
  Administered 2013-10-27 – 2013-10-28 (×3): 1 [IU] via SUBCUTANEOUS

## 2013-10-27 MED ORDER — DILTIAZEM HCL 30 MG PO TABS
30.0000 mg | ORAL_TABLET | Freq: Two times a day (BID) | ORAL | Status: DC
  Administered 2013-10-27 – 2013-10-28 (×3): 30 mg via ORAL
  Filled 2013-10-27 (×4): qty 1

## 2013-10-27 MED ORDER — ASPIRIN 81 MG PO CHEW: 81.0000 mg | CHEWABLE_TABLET | Freq: Every day | ORAL | Status: DC

## 2013-10-27 MED ORDER — NITROGLYCERIN IN D5W 200-5 MCG/ML-% IV SOLN
INTRAVENOUS | Status: AC
  Filled 2013-10-27: qty 250

## 2013-10-27 MED ORDER — ASPIRIN 81 MG PO CHEW
81.0000 mg | CHEWABLE_TABLET | Freq: Every day | ORAL | Status: DC
  Administered 2013-10-28: 81 mg via ORAL
  Filled 2013-10-27: qty 1

## 2013-10-27 MED ORDER — TICAGRELOR 90 MG PO TABS
ORAL_TABLET | ORAL | Status: AC
  Administered 2013-10-27: 180 mg via ORAL
  Filled 2013-10-27: qty 1

## 2013-10-27 MED ORDER — SODIUM CHLORIDE 0.9 % IJ SOLN: 3.0000 mL | INTRAMUSCULAR | Status: DC | PRN

## 2013-10-27 MED ORDER — SODIUM CHLORIDE 0.9 % IJ SOLN: 3.0000 mL | Freq: Two times a day (BID) | INTRAMUSCULAR | Status: DC

## 2013-10-27 MED ORDER — ONDANSETRON HCL 4 MG/2ML IJ SOLN: 4.0000 mg | Freq: Four times a day (QID) | INTRAMUSCULAR | Status: DC | PRN

## 2013-10-27 MED ORDER — ALPRAZOLAM 0.5 MG PO TABS
0.5000 mg | ORAL_TABLET | Freq: Every evening | ORAL | Status: DC | PRN
  Administered 2013-10-27: 23:00:00 0.5 mg via ORAL
  Filled 2013-10-27: qty 1

## 2013-10-27 MED ORDER — SODIUM CHLORIDE 0.9 % IV SOLN: 250.0000 mL | INTRAVENOUS | Status: DC | PRN

## 2013-10-27 MED ORDER — BIVALIRUDIN 250 MG IV SOLR
INTRAVENOUS | Status: AC
  Filled 2013-10-27: qty 250

## 2013-10-27 MED ORDER — NITROGLYCERIN IN D5W 200-5 MCG/ML-% IV SOLN: 5.0000 ug/min | INTRAVENOUS | Status: DC

## 2013-10-27 MED ORDER — LEVOTHYROXINE SODIUM 75 MCG PO TABS
75.0000 ug | ORAL_TABLET | Freq: Every day | ORAL | Status: DC
  Administered 2013-10-27 – 2013-10-28 (×2): 75 ug via ORAL
  Filled 2013-10-27 (×3): qty 1

## 2013-10-27 MED ORDER — MIDAZOLAM HCL 2 MG/2ML IJ SOLN
INTRAMUSCULAR | Status: AC
  Filled 2013-10-27: qty 2

## 2013-10-27 MED ORDER — TICAGRELOR 90 MG PO TABS
ORAL_TABLET | ORAL | Status: AC
  Filled 2013-10-27: qty 1

## 2013-10-27 MED ORDER — ACETAMINOPHEN 325 MG PO TABS: 650.0000 mg | ORAL_TABLET | ORAL | Status: DC | PRN

## 2013-10-27 MED ORDER — GLIPIZIDE 10 MG PO TABS
10.0000 mg | ORAL_TABLET | Freq: Two times a day (BID) | ORAL | Status: DC
  Administered 2013-10-27 – 2013-10-28 (×2): 10 mg via ORAL
  Filled 2013-10-27 (×4): qty 1

## 2013-10-27 MED ORDER — LIDOCAINE HCL (PF) 1 % IJ SOLN
INTRAMUSCULAR | Status: AC
  Filled 2013-10-27: qty 30

## 2013-10-27 MED ORDER — ADULT MULTIVITAMIN W/MINERALS CH
1.0000 | ORAL_TABLET | Freq: Every day | ORAL | Status: DC
  Administered 2013-10-27 – 2013-10-28 (×2): 1 via ORAL
  Filled 2013-10-27 (×2): qty 1

## 2013-10-27 MED ORDER — TICAGRELOR 90 MG PO TABS
180.0000 mg | ORAL_TABLET | Freq: Once | ORAL | Status: AC
  Administered 2013-10-27: 180 mg via ORAL

## 2013-10-27 MED ORDER — RAMIPRIL 5 MG PO CAPS
5.0000 mg | ORAL_CAPSULE | Freq: Every day | ORAL | Status: DC
  Administered 2013-10-27 – 2013-10-28 (×2): 5 mg via ORAL
  Filled 2013-10-27 (×2): qty 1

## 2013-10-27 MED ORDER — HEPARIN (PORCINE) IN NACL 2-0.9 UNIT/ML-% IJ SOLN
INTRAMUSCULAR | Status: AC
  Filled 2013-10-27: qty 1000

## 2013-10-27 MED ORDER — METOPROLOL TARTRATE 25 MG PO TABS
25.0000 mg | ORAL_TABLET | Freq: Two times a day (BID) | ORAL | Status: DC
  Administered 2013-10-27 – 2013-10-28 (×3): 25 mg via ORAL
  Filled 2013-10-27 (×4): qty 1

## 2013-10-27 MED ORDER — FENTANYL CITRATE 0.05 MG/ML IJ SOLN
INTRAMUSCULAR | Status: AC
  Filled 2013-10-27: qty 2

## 2013-10-27 MED ORDER — SODIUM CHLORIDE 0.9 % IV SOLN: INTRAVENOUS | Status: AC

## 2013-10-27 NOTE — CV Procedure (Signed)
PTCA/stent report dictated on 10/27/2013 dictation number is [id number]

## 2013-10-27 NOTE — Progress Notes (Signed)
Site area: left groin  Site Prior to Removal:  Level 0  Pressure Applied For 20 MINUTES    Minutes Beginning at 1225  Manual:   yes  Patient Status During Pull:  AAO X 4  Post Pull Groin Site:  Level 0  Post Pull Instructions Given:  yes  Post Pull Pulses Present:  yes  Dressing Applied:  yes  Comments:  Tolerated procedure well

## 2013-10-27 NOTE — Interval H&P Note (Signed)
Cath Lab Visit (complete for each Cath Lab visit)  Clinical Evaluation Leading to the Procedure:   ACS: yes  Non-ACS:    Anginal Classification: CCS III  Anti-ischemic medical therapy: Maximal Therapy (2 or more classes of medications)  Non-Invasive Test Results: No non-invasive testing performed  Prior CABG: No previous CABG      History and Physical Interval Note:  10/27/2013 7:54 AM  Tracey [name]  has presented today for surgery, with the diagnosis of CAD  The various methods of treatment have been discussed with the patient and family. After consideration of risks, benefits and other options for treatment, the patient has consented to  Procedure(s): PERCUTANEOUS CORONARY STENT INTERVENTION (PCI-S) (N/A) as a surgical intervention .  The patient's history has been reviewed, patient examined, no change in status, stable for surgery.  I have reviewed the patient's chart and labs.  Questions were answered to the patient's satisfaction.     Robynn Pane

## 2013-10-27 NOTE — H&P (Signed)
  Handwritten H&P in the chart needs to be scanned. 

## 2013-10-28 LAB — CBC
MCV: 85.4 fL (ref 78.0–100.0)
Platelets: 255 10*3/uL (ref 150–400)
RBC: 4.19 MIL/uL (ref 3.87–5.11)
RDW: 17.4 % — ABNORMAL HIGH (ref 11.5–15.5)
WBC: 10.6 10*3/uL — ABNORMAL HIGH (ref 4.0–10.5)

## 2013-10-28 LAB — BASIC METABOLIC PANEL
CO2: 27 mEq/L (ref 19–32)
Chloride: 105 mEq/L (ref 96–112)
GFR calc Af Amer: >90 mL/min (ref >90)
GFR calc non Af Amer: >90 mL/min (ref >90)
Potassium: 3.8 mEq/L (ref 3.5–5.1)
Sodium: 140 mEq/L (ref 135–145)

## 2013-10-28 LAB — GLUCOSE, CAPILLARY

## 2013-10-28 MED ORDER — FUROSEMIDE 10 MG/ML IJ SOLN
40.0000 mg | Freq: Once | INTRAMUSCULAR | Status: AC
  Administered 2013-10-28: 05:00:00 40 mg via INTRAVENOUS

## 2013-10-28 MED ORDER — TICAGRELOR 90 MG PO TABS: 90.0000 mg | ORAL_TABLET | Freq: Two times a day (BID) | ORAL | Status: DC

## 2013-10-28 MED ORDER — FUROSEMIDE 10 MG/ML IJ SOLN
INTRAMUSCULAR | Status: AC
  Filled 2013-10-28: qty 4

## 2013-10-28 MED ORDER — METFORMIN HCL 500 MG PO TABS: 1000.0000 mg | ORAL_TABLET | Freq: Two times a day (BID) | ORAL | Status: DC

## 2013-10-28 MED ORDER — RAMIPRIL 10 MG PO CAPS: 10.0000 mg | ORAL_CAPSULE | Freq: Every day | ORAL | Status: DC

## 2013-10-28 MED FILL — Sodium Chloride IV Soln 0.9%: INTRAVENOUS | Qty: 50 | Status: AC

## 2013-10-28 NOTE — Progress Notes (Signed)
CARDIAC REHAB PHASE I   PRE:  Rate/Rhythm: 80 Afib  BP:  Supine: 149/94  Sitting:   Standing:    SaO2: 98 2L 96 RA  MODE:  Ambulation: up on room and to bathroom   POST:  Rate/Rhythm: 120-130 Afib  BP:  Supine:   Sitting:   Standing:    SaO2:  0740-0900 On arrival pt in bed, pt sleeping. Aroused easily, pt seemed SOB with talking and moving in bed. She denies feeling SOB and states that she had some prior to admission. When getting pt out of bed to ambulate, she states that she is wet. Pt was saturated in urine. Her depend and bed was soaked. Pt states that she got Lasix in her IV and went to sleep and she did not wake up to go to the bathroom. Cleaned pt up went to bathroom and was up in room, her HR was 120- 130 A fib.Some DOE but pt seemed less SOB out of bed and in recliner. Completed stent discharge education with pt. She voices understanding. Pt agrees to Outpt. CRP in [place], will send referral. Pt in recliner eating breakfast, her HR up at times sitting, Notified RN she was in to give am medications.RN will ambulate her in hall after she has had medications for awhile.  Melina Copa RN 10/28/2013 8:57 AM

## 2013-10-28 NOTE — Cardiovascular Report (Signed)
[name], [name]               ACCOUNT NO.:  [id number]  MEDICAL RECORD NO.:  [id number]  LOCATION:  6C06C                        FACILITY:  [facility]  PHYSICIAN:  Eduardo Osier. Sharyn Lull, M.D. DATE OF BIRTH:  06-Mar-1941  DATE OF PROCEDURE:  10/27/2013 DATE OF DISCHARGE:                           CARDIAC CATHETERIZATION   PROCEDURE: 1. Successful PTCA to mid RCA using 2.5 x 8 mm long emerge balloon for     predilatation. 2. Successful deployment of Xience Alpine 3.0 x 15 mm long drug-     eluting stent in mid RCA. 3. Successful postdilatation of this stent using 3.25 x 12 mm long [place]     Trek balloon.  INDICATION FOR THE PROCEDURE:  Ms. Swim is a 72 year old female with past medical history significant for coronary artery disease, status post recent left cath noted to have critical mid RCA stenosis, hypertension, diabetes mellitus, hypercholesteremia, chronic AFib, hypercholesteremia, morbid obesity, hypothyroidism, history of CHF secondary to systolic dysfunction/ischemia.  The patient complained of shortness of breath with minimal exertion associated with feeling weak, tired, fatigued.  Denies any chest pain, nausea, vomiting, diaphoresis, although her activity is limited.  The patient recently was admitted to the hospital with acute left heart failure, subsequently, had left cath as above.  Post cath, the patient had large hematoma and was discharged home and he is now being electively scheduled for PCI to RCA.  Discussed with the patient regarding PTCA stenting to mid RCA.  Its risks and benefits, i.e., death, MI, stroke, need for emergency CABG, local vascular complications, etc. and consented for PCI.  DESCRIPTION OF PROCEDURE:  After obtaining the informed consent, the patient was brought to the cath lab and was placed on fluoroscopy table. Left groin was prepped and draped in usual fashion.  Xylocaine 1% was used for local anesthesia in the left groin.  With the help of thin  wall needle, 6-French arterial sheath was placed.  The sheath was aspirated and flushed.  Next, 6-French right Judkins catheter was advanced over the wire under fluoroscopic guidance up to the ascending aorta.  Wire was pulled out.  The catheter was aspirated and connected to the Manifold.  Catheter was further advanced and engaged into right coronary ostium.  Multiple views of the right system were taken.  FINDINGS:  The patient had focal 80-85% mid RCA stenosis as before.  INTERVENTIONAL PROCEDURE:  Successful PTCA to mid RCA was done using 2.5 x 8 mm long emerged monorail balloon going up to 6 and 10 atmospheric pressure.  Two inflations were done.  Next for predilatation and then 3.0 x 15 mm long Xience L5 and drug-eluting stent was deployed at 10 atmospheric pressure in the mid RCA.  This stent was post dilated using 3.25 x 12 mm long [place] Trek balloon going up to 18 atmospheric pressure. Lesion dilated from 80-85% to 0% ostial with excellent TIMI grade 3 distal flow without evidence of dissection or distal embolization.  The patient received weight based Angiomax and 180 mg of Brilinta prior to the procedure.  The patient tolerated the procedure well.  There were no complications.  The patient was transferred to recovery room in stable condition.  Eduardo Osier. Sharyn Lull, M.D.     MNH/MEDQ  D:  10/27/2013  T:  10/28/2013  Job:  [id number]

## 2013-10-28 NOTE — Discharge Summary (Signed)
  Discharge summary dictated on 10/28/2013 dictation number is [id number]

## 2013-10-29 NOTE — Discharge Summary (Signed)
NAMEJULIETTE, Tracey [name]               ACCOUNT NO.:  [id number]  MEDICAL RECORD NO.:  [id number]  LOCATION:  6C06C                        FACILITY:  [facility]  PHYSICIAN:  Eduardo Osier. Sharyn Lull, M.D. DATE OF BIRTH:  Aug 10, 1941  DATE OF ADMISSION:  10/27/2013 DATE OF DISCHARGE:  10/28/2013                              DISCHARGE SUMMARY   ADMITTING DIAGNOSES: 1. Exertional dyspnea probably angina equivalent status post recent     cath, noted to have critical mid RCA stenosis. 2. Compensated systolic heart failure. 3. Hypertension. 4. Diabetes mellitus. 5. Chronic atrial fibrillation. 6. Hypercholesteremia. 7. Hypothyroidism. 8. Morbid obesity. 9. Resolving hematoma right groin.  FINAL DIAGNOSES: 1. Stable angina, status post percutaneous transluminal coronary     angioplasty stenting to mid RCA. 2. Compensated systolic heart failure. 3. Chronic atrial fibrillation. 4. Hypertension. 5. Diabetes mellitus. 6. Hypercholesteremia. 7. Hypothyroidism. 8. Morbid obesity. 9. Resolving hematoma right groin.  DISCHARGE HOME MEDICATIONS: 1. Brilinta 90 mg one tablet twice daily. 2. Aspirin 81 mg one tablet daily. 3. Ramipril 10 mg one capsule daily. 4. Xanax 0.5 mg at bedtime as needed. 5. Digoxin 0.125 mg one tablet daily. 6. Diltiazem 30 mg twice daily. 7. Lexapro 20 mg daily. 8. Lasix 40 mg one tablet daily. 9. Glipizide 10 mg one tablet twice daily. 10.Levothyroxine 75 mcg daily. 11.Metformin 1000 mg twice daily starting from tomorrow. 12.Metoprolol tartrate 25 mg twice daily. 13.Multivitamin with mineral one tablet daily. 14.Aldactone 25 mg one tablet daily.  The patient has been advised to stop the Coumadin for now and will be restarted as outpatient in few days.  CONDITION AT DISCHARGE:  Stable.  INSTRUCTIONS:  Follow up with Dr. Algie Coffer early next week.  Post-PTCA stent instructions have been given.  The patient has been advised regarding diet, exercise.  The patient will be  scheduled for phase 2 cardiac rehab as outpatient.  BRIEF HISTORY AND HOSPITAL COURSE:  [name] is a 72 year old female with past medical history significant for coronary artery disease status post recent left cath noted to have critical mid RCA stenosis, hypertension, diabetes mellitus, hypercholesteremia, chronic AFib, morbid obesity, hypothyroidism, history of CHF secondary to systolic dysfunction, complains of shortness of breath with minimal exertion associated with feeling tired, fatigue, weak.  Denies any chest pain, nausea, vomiting, diaphoresis, although activity is very limited.  The patient recently was treated for acute left heart failure and subsequently, had left cath as above and post cath developed large hematoma and now is electively scheduled for PCI to mid RCA.  The patient also complains of occasional palpitation or lightheadedness or syncope.  PAST MEDICAL HISTORY:  As above.  MEDICATION AT HOME:  She was on warfarin, aspirin, digoxin, Cardizem, Lopressor, ramipril, Aldactone, Lasix, metformin, glipizide, Lexapro, Xanax, and levothyroxine.  PHYSICAL EXAMINATION:  GENERAL:  She is alert, awake, oriented x3 in no acute distress.  Hemodynamically stable. HEENT:  Conjunctivae was pink. NECK:  Supple.  No JVD.  No bruit. LUNGS:  Clear to auscultation without rhonchi or rales. CARDIOVASCULAR:  Irregularly irregular, S1, S2 was normal.  There was soft systolic murmur.  No S3 gallop. ABDOMEN:  Soft.  Bowel sounds were present.  Obese, nontender. EXTREMITIES:  There is no clubbing, cyanosis, or edema.  LABORATORY DATA:  Postprocedure today; sodium is 140, potassium 3.8, BUN 13, creatinine 0.56.  Hemoglobin is 11.3, hematocrit 35.8, platelet count 255,000.  Her EKG this morning showed atrial fibrillation with controlled ventricular response, right bundle branch block, and secondary ST-T wave changes.  BRIEF HOSPITAL COURSE:  The patient was admitted and  underwent PTCA stenting to mid RCA with excellent results from left groin.  The patient did not have any episodes of chest pain during the hospital stay.  Her groin is stable with no evidence of hematoma or bruit.  The patient is ambulating in the room without any problems.  The patient will be discharged home on the above medications and will be followed by Dr. Algie Coffer early next week.  She will discuss with Dr. Algie Coffer regarding anticoagulation along with 2 dual antiplatelet medications.  The patient also will be scheduled for phase 2 cardiac rehab as outpatient.     Eduardo Osier. Sharyn Lull, M.D.     M[facility]/MEDQ  D:  10/28/2013  T:  10/29/2013  Job:  [id number]  cc:   Ricki Rodriguez, M.D.

## 2013-11-07 ENCOUNTER — Other Ambulatory Visit: Payer: Self-pay | Admitting: Cardiovascular Disease

## 2013-11-07 ENCOUNTER — Ambulatory Visit
Admission: RE | Admit: 2013-11-07 | Discharge: 2013-11-07 | Disposition: A | Discharge status: Home / Self Care | Payer: Medicare Other | Source: Ambulatory Visit | Attending: Cardiovascular Disease | Admitting: Cardiovascular Disease

## 2013-11-07 DIAGNOSIS — R0602 Shortness of breath: Secondary | ICD-10-CM

## 2014-04-07 ENCOUNTER — Other Ambulatory Visit (INDEPENDENT_AMBULATORY_CARE_PROVIDER_SITE_OTHER): Payer: Commercial Managed Care - HMO

## 2014-04-07 ENCOUNTER — Encounter: Payer: Self-pay | Admitting: Internal Medicine

## 2014-04-07 ENCOUNTER — Ambulatory Visit (INDEPENDENT_AMBULATORY_CARE_PROVIDER_SITE_OTHER): Payer: Commercial Managed Care - HMO | Admitting: Internal Medicine

## 2014-04-07 VITALS — BP 140/82 | HR 90 | Temp 97.2°F | Ht 65.5 in | Wt 217.0 lb

## 2014-04-07 DIAGNOSIS — D649 Anemia, unspecified: Secondary | ICD-10-CM | POA: Insufficient documentation

## 2014-04-07 DIAGNOSIS — E119 Type 2 diabetes mellitus without complications: Secondary | ICD-10-CM

## 2014-04-07 DIAGNOSIS — I10 Essential (primary) hypertension: Secondary | ICD-10-CM | POA: Insufficient documentation

## 2014-04-07 DIAGNOSIS — E039 Hypothyroidism, unspecified: Secondary | ICD-10-CM | POA: Insufficient documentation

## 2014-04-07 DIAGNOSIS — Z0000 Encounter for general adult medical examination without abnormal findings: Secondary | ICD-10-CM

## 2014-04-07 DIAGNOSIS — I4891 Unspecified atrial fibrillation: Secondary | ICD-10-CM | POA: Insufficient documentation

## 2014-04-07 DIAGNOSIS — Z23 Encounter for immunization: Secondary | ICD-10-CM

## 2014-04-07 DIAGNOSIS — I2510 Atherosclerotic heart disease of native coronary artery without angina pectoris: Secondary | ICD-10-CM | POA: Insufficient documentation

## 2014-04-07 DIAGNOSIS — I509 Heart failure, unspecified: Secondary | ICD-10-CM | POA: Insufficient documentation

## 2014-04-07 DIAGNOSIS — M1990 Unspecified osteoarthritis, unspecified site: Secondary | ICD-10-CM | POA: Insufficient documentation

## 2014-04-07 LAB — CBC WITH DIFFERENTIAL/PLATELET
BASOS ABS: 0.1 10*3/uL (ref 0.0–0.1)
Basophils Relative: 0.6 % (ref 0.0–3.0)
EOS ABS: 0.5 10*3/uL (ref 0.0–0.7)
Eosinophils Relative: 4.5 % (ref 0.0–5.0)
HCT: 37.4 % (ref 36.0–46.0)
Hemoglobin: 12.1 g/dL (ref 12.0–15.0)
LYMPHS PCT: 23.9 % (ref 12.0–46.0)
Lymphs Abs: 2.5 10*3/uL (ref 0.7–4.0)
MCHC: 32.2 g/dL (ref 30.0–36.0)
MCV: 88.4 fl (ref 78.0–100.0)
Monocytes Absolute: 1 10*3/uL (ref 0.1–1.0)
Monocytes Relative: 9.7 % (ref 3.0–12.0)
NEUTROS PCT: 61.3 % (ref 43.0–77.0)
Neutro Abs: 6.5 10*3/uL (ref 1.4–7.7)
Platelets: 299 10*3/uL (ref 150.0–400.0)
RBC: 4.23 Mil/uL (ref 3.87–5.11)
RDW: 14 % (ref 11.5–15.5)
WBC: 10.6 10*3/uL — ABNORMAL HIGH (ref 4.0–10.5)

## 2014-04-07 LAB — URINALYSIS, ROUTINE W REFLEX MICROSCOPIC
Bilirubin Urine: NEGATIVE
HGB URINE DIPSTICK: NEGATIVE
Ketones, ur: NEGATIVE
NITRITE: POSITIVE — AB
Specific Gravity, Urine: 1.02 (ref 1.000–1.030)
Total Protein, Urine: NEGATIVE
UROBILINOGEN UA: 1 (ref 0.0–1.0)
Urine Glucose: NEGATIVE
pH: 6 (ref 5.0–8.0)

## 2014-04-07 LAB — BASIC METABOLIC PANEL
BUN: 20 mg/dL (ref 6–23)
CO2: 33 mEq/L — ABNORMAL HIGH (ref 19–32)
CREATININE: 0.8 mg/dL (ref 0.4–1.2)
Calcium: 9 mg/dL (ref 8.4–10.5)
Chloride: 99 mEq/L (ref 96–112)
GFR: 78.02 mL/min (ref >60.00)
Glucose, Bld: 85 mg/dL (ref 70–99)
Potassium: 3.9 mEq/L (ref 3.5–5.1)
Sodium: 138 mEq/L (ref 135–145)

## 2014-04-07 LAB — LIPID PANEL
CHOL/HDL RATIO: 6
Cholesterol: 202 mg/dL — ABNORMAL HIGH (ref 0–200)
HDL: 35.4 mg/dL — ABNORMAL LOW (ref >39.00)
LDL Cholesterol: 151 mg/dL — ABNORMAL HIGH (ref 0–99)
Triglycerides: 79 mg/dL (ref 0.0–149.0)
VLDL: 15.8 mg/dL (ref 0.0–40.0)

## 2014-04-07 LAB — HEPATIC FUNCTION PANEL
ALK PHOS: 69 U/L (ref 39–117)
ALT: 17 U/L (ref 0–35)
AST: 18 U/L (ref 0–37)
Albumin: 3.4 g/dL — ABNORMAL LOW (ref 3.5–5.2)
BILIRUBIN DIRECT: 0.1 mg/dL (ref 0.0–0.3)
Total Bilirubin: 0.7 mg/dL (ref 0.2–1.2)
Total Protein: 7.4 g/dL (ref 6.0–8.3)

## 2014-04-07 LAB — TSH: TSH: 1.59 u[IU]/mL (ref 0.35–4.50)

## 2014-04-07 LAB — MICROALBUMIN / CREATININE URINE RATIO
Creatinine,U: 85.6 mg/dL
MICROALB/CREAT RATIO: 6.7 mg/g (ref 0.0–30.0)
Microalb, Ur: 5.7 mg/dL — ABNORMAL HIGH (ref 0.0–1.9)

## 2014-04-07 LAB — HEMOGLOBIN A1C: Hgb A1c MFr Bld: 6.8 % — ABNORMAL HIGH (ref 4.6–6.5)

## 2014-04-07 NOTE — Patient Instructions (Addendum)
You had the new Prevnar pneuomonia shot today  You are given the prescription for the Shingles shot to take to a pharmacy if you like to have this done  Please continue all other medications as before, and refills have been done if requested. Please have the pharmacy call with any other refills you may need.  Please continue your efforts at being more active, low cholesterol diet, and weight control.  You are otherwise up to date with prevention measures today.  Please keep your appointments with your specialists as you have planned - cardiology  You will be contacted regarding the referral for: mammogram  Please go to the LAB in the Basement (turn left off the elevator) for the tests to be done today  You will be contacted by phone if any changes need to be made immediately.  Otherwise, you will receive a letter about your results with an explanation, but please check with MyChart first.  Please return in 6 months, or sooner if needed, with Lab testing done 3-5 days before

## 2014-04-07 NOTE — Progress Notes (Signed)
Pre visit review using our clinic review tool, if applicable. No additional management support is needed unless otherwise documented below in the visit note. 

## 2014-04-07 NOTE — Addendum Note (Signed)
Addended by: Scharlene Gloss B on: 04/07/2014 03:42 PM   Modules accepted: Orders

## 2014-04-07 NOTE — Assessment & Plan Note (Signed)

## 2014-04-07 NOTE — Assessment & Plan Note (Signed)
stable overall by history and exam, recent data reviewed with pt, and pt to continue medical treatment as before,  to f/u any worsening symptoms or concerns, for labs today Lab Results  Component Value Date   HGBA1C 8.0* 09/16/2013

## 2014-04-07 NOTE — Progress Notes (Signed)
Subjective:    Patient ID: Tracey [name], female    DOB: 04/03/1941, 73 y.o.   MRN: [id number]  HPI  Here for wellness and f/u;  Overall doing ok;  Pt denies CP, worsening SOB, DOE, wheezing, orthopnea, PND, worsening LE edema, palpitations, dizziness or syncope.  Pt denies neurological change such as new headache, facial or extremity weakness.  Pt denies polydipsia, polyuria, or low sugar symptoms. Pt states overall good compliance with treatment and medications, good tolerability, and has been trying to follow lower cholesterol diet.  Pt denies worsening depressive symptoms, suicidal ideation or panic. No fever, night sweats, wt loss, loss of appetite, or other constitutional symptoms.  Pt states good ability with ADL's, has low fall risk, home safety reviewed and adequate, no other significant changes in hearing or vision, and only occasionally active with exercise, due to ongoing right knee DJD, sees Dr Madelon Lipsaffrey, with cortisone recent; also sees cardiology. Past Medical History  Diagnosis Date  . CHF (congestive heart failure)   . Atrial fibrillation   . Diabetes mellitus without complication   . Hypertension   . Thyroid disease   . Coronary artery disease   . Hypothyroidism   . Anxiety   . Shortness of breath     WITH MY CHF  . Arthritis     RT KNEE  . Anemia   . ACS (acute coronary syndrome) 10/27/2013  . Diverticulosis 10/22/2005    Qualifier: Diagnosis of  By: Koleen DistanceKowalk CMA (AAMA), Hulan SaasLeisha    . HIATAL HERNIA 10/22/2005    Qualifier: Diagnosis of  By: Koleen DistanceKowalk CMA (AAMA), Hulan SaasLeisha    . HYPERCHOLESTEROLEMIA 03/09/2008    Qualifier: Diagnosis of  By: Koleen DistanceKowalk CMA Duncan Dull(AAMA), Hulan SaasLeisha     Past Surgical History  Procedure Laterality Date  . Eye surgery    . Abdominal hysterectomy    . Cardiac catheterization  10/27/2013  . Coronary angioplasty with stent placement  10/27/2013    DES   TO RCA       DR HARWANI  . Cholecystectomy      reports that she has quit smoking. She has never used  smokeless tobacco. She reports that she does not drink alcohol or use illicit drugs. family history includes Heart disease in her father. Allergies  Allergen Reactions  . Codeine Nausea And Vomiting   Current Outpatient Prescriptions on File Prior to Visit  Medication Sig Dispense Refill  . ALPRAZolam (XANAX) 0.5 MG tablet Take 0.5 mg by mouth at bedtime as needed for sleep.      Marland Kitchen. aspirin 81 MG chewable tablet Chew 1 tablet (81 mg total) by mouth daily.      . digoxin (LANOXIN) 0.125 MG tablet Take 1 tablet (0.125 mg total) by mouth daily.  30 tablet  1  . diltiazem (CARDIZEM) 30 MG tablet Take 30 mg by mouth 2 (two) times daily.      Marland Kitchen. escitalopram (LEXAPRO) 20 MG tablet Take 20 mg by mouth daily.      . furosemide (LASIX) 40 MG tablet Take 1 tablet (40 mg total) by mouth daily.  30 tablet  3  . glipiZIDE (GLUCOTROL) 10 MG tablet Take 10 mg by mouth 2 (two) times daily before a meal.      . levothyroxine (SYNTHROID, LEVOTHROID) 75 MCG tablet Take 1 tablet (75 mcg total) by mouth daily before breakfast.  30 tablet  3  . metFORMIN (GLUCOPHAGE) 500 MG tablet Take 2 tablets (1,000 mg total) by mouth 2 (two) times  daily with a meal.  60 tablet  3  . metoprolol tartrate (LOPRESSOR) 25 MG tablet Take 1 tablet (25 mg total) by mouth 2 (two) times daily.  60 tablet  3  . Multiple Vitamin (MULTIVITAMIN WITH MINERALS) TABS tablet Take 1 tablet by mouth daily.      . ramipril (ALTACE) 10 MG capsule Take 1 capsule (10 mg total) by mouth daily.  30 capsule  3  . spironolactone (ALDACTONE) 25 MG tablet Take 1 tablet (25 mg total) by mouth daily.  30 tablet  3  . Ticagrelor (BRILINTA) 90 MG TABS tablet Take 1 tablet (90 mg total) by mouth 2 (two) times daily.  60 tablet  11   No current facility-administered medications on file prior to visit.   Review of Systems .Constitutional: Negative for increased diaphoresis, other activity, appetite or other siginficant weight change  HENT: Negative for worsening  hearing loss, ear pain, facial swelling, mouth sores and neck stiffness.   Eyes: Negative for other worsening pain, redness or visual disturbance.  Respiratory: Negative for shortness of breath and wheezing.   Cardiovascular: Negative for chest pain and palpitations.  Gastrointestinal: Negative for diarrhea, blood in stool, abdominal distention or other pain Genitourinary: Negative for hematuria, flank pain or change in urine volume.  Musculoskeletal: Negative for myalgias or other joint complaints.  Skin: Negative for color change and wound.  Neurological: Negative for syncope and numbness. other than noted Hematological: Negative for adenopathy. or other swelling Psychiatric/Behavioral: Negative for hallucinations, self-injury, decreased concentration or other worsening agitation.      Objective:   Physical Exam BP 140/82  Pulse 90  Temp(Src) 97.2 F (36.2 C) (Oral)  Ht 5' 5.5" (1.664 m)  Wt 217 lb (98.431 kg)  BMI 35.55 kg/m2  SpO2 95% VS noted,  Constitutional: Pt is oriented to person, place, and time. Appears well-developed and well-nourished.  Head: Normocephalic and atraumatic.  Right Ear: External ear normal.  Left Ear: External ear normal.  Nose: Nose normal.  Mouth/Throat: Oropharynx is clear and moist.  Eyes: Conjunctivae and EOM are normal. Pupils are equal, round, and reactive to light.  Neck: Normal range of motion. Neck supple. No JVD present. No tracheal deviation present.  Cardiovascular: Normal rate, regular rhythm, normal heart sounds and intact distal pulses.   Pulmonary/Chest: Effort normal and breath sounds without rales or wheezing  Abdominal: Soft. Bowel sounds are normal. NT. No HSM  Musculoskeletal: Normal range of motion. Exhibits no edema.  Lymphadenopathy:  Has no cervical adenopathy.  Neurological: Pt is alert and oriented to person, place, and time. Pt has normal reflexes. No cranial nerve deficit. Motor grossly intact Skin: Skin is warm and dry.  No rash noted.  Psychiatric:  Has normal mood and affect. Behavior is normal.  Right knee with small effusion, NT    Assessment & Plan:

## 2014-04-11 ENCOUNTER — Telehealth: Payer: Self-pay | Admitting: Internal Medicine

## 2014-04-11 MED ORDER — CEPHALEXIN 500 MG PO CAPS: 500.0000 mg | ORAL_CAPSULE | Freq: Four times a day (QID) | ORAL | Status: DC

## 2014-04-11 NOTE — Telephone Encounter (Signed)
Done erx 

## 2014-04-11 NOTE — Telephone Encounter (Signed)
Message copied by Corwin Levins on Tue Apr 11, 2014  7:08 PM ------      Message from: Pincus Sanes      Created: Tue Apr 11, 2014  3:31 PM       Called the patient informed of results and she is having pressure.  [facility] ------

## 2014-04-12 ENCOUNTER — Encounter: Payer: Self-pay | Admitting: Internal Medicine

## 2014-04-12 ENCOUNTER — Other Ambulatory Visit: Payer: Self-pay | Admitting: Internal Medicine

## 2014-04-12 MED ORDER — ATORVASTATIN CALCIUM 20 MG PO TABS: 20.0000 mg | ORAL_TABLET | Freq: Every day | ORAL | Status: DC

## 2014-05-24 ENCOUNTER — Other Ambulatory Visit: Payer: Self-pay | Admitting: Internal Medicine

## 2014-07-05 ENCOUNTER — Inpatient Hospital Stay (HOSPITAL_COMMUNITY)
Admission: AD | Admit: 2014-07-05 | Discharge: 2014-07-11 | DRG: 292 | Disposition: A | Discharge status: Home / Self Care | Payer: Medicare HMO | Source: Ambulatory Visit | Attending: Cardiovascular Disease | Admitting: Cardiovascular Disease

## 2014-07-05 ENCOUNTER — Encounter (HOSPITAL_COMMUNITY): Payer: Self-pay | Admitting: General Practice

## 2014-07-05 DIAGNOSIS — J45909 Unspecified asthma, uncomplicated: Secondary | ICD-10-CM | POA: Diagnosis present

## 2014-07-05 DIAGNOSIS — E785 Hyperlipidemia, unspecified: Secondary | ICD-10-CM | POA: Diagnosis present

## 2014-07-05 DIAGNOSIS — Z6841 Body Mass Index (BMI) 40.0 and over, adult: Secondary | ICD-10-CM

## 2014-07-05 DIAGNOSIS — Z6837 Body mass index (BMI) 37.0-37.9, adult: Secondary | ICD-10-CM

## 2014-07-05 DIAGNOSIS — Z7901 Long term (current) use of anticoagulants: Secondary | ICD-10-CM | POA: Diagnosis not present

## 2014-07-05 DIAGNOSIS — L03119 Cellulitis of unspecified part of limb: Secondary | ICD-10-CM

## 2014-07-05 DIAGNOSIS — L02419 Cutaneous abscess of limb, unspecified: Secondary | ICD-10-CM | POA: Diagnosis not present

## 2014-07-05 DIAGNOSIS — IMO0002 Reserved for concepts with insufficient information to code with codable children: Secondary | ICD-10-CM

## 2014-07-05 DIAGNOSIS — E7800 Pure hypercholesterolemia, unspecified: Secondary | ICD-10-CM | POA: Diagnosis present

## 2014-07-05 DIAGNOSIS — I5023 Acute on chronic systolic (congestive) heart failure: Secondary | ICD-10-CM | POA: Diagnosis present

## 2014-07-05 DIAGNOSIS — E039 Hypothyroidism, unspecified: Secondary | ICD-10-CM | POA: Diagnosis present

## 2014-07-05 DIAGNOSIS — I5021 Acute systolic (congestive) heart failure: Secondary | ICD-10-CM | POA: Diagnosis present

## 2014-07-05 DIAGNOSIS — Z87891 Personal history of nicotine dependence: Secondary | ICD-10-CM | POA: Diagnosis not present

## 2014-07-05 DIAGNOSIS — M1710 Unilateral primary osteoarthritis, unspecified knee: Secondary | ICD-10-CM | POA: Diagnosis present

## 2014-07-05 DIAGNOSIS — E1169 Type 2 diabetes mellitus with other specified complication: Secondary | ICD-10-CM | POA: Diagnosis present

## 2014-07-05 DIAGNOSIS — I4891 Unspecified atrial fibrillation: Secondary | ICD-10-CM | POA: Diagnosis present

## 2014-07-05 DIAGNOSIS — Z9861 Coronary angioplasty status: Secondary | ICD-10-CM | POA: Diagnosis not present

## 2014-07-05 DIAGNOSIS — R0602 Shortness of breath: Secondary | ICD-10-CM | POA: Diagnosis present

## 2014-07-05 DIAGNOSIS — I2510 Atherosclerotic heart disease of native coronary artery without angina pectoris: Secondary | ICD-10-CM | POA: Diagnosis present

## 2014-07-05 DIAGNOSIS — I2589 Other forms of chronic ischemic heart disease: Secondary | ICD-10-CM | POA: Diagnosis present

## 2014-07-05 DIAGNOSIS — Z794 Long term (current) use of insulin: Secondary | ICD-10-CM | POA: Diagnosis not present

## 2014-07-05 DIAGNOSIS — I509 Heart failure, unspecified: Secondary | ICD-10-CM | POA: Diagnosis present

## 2014-07-05 DIAGNOSIS — Z885 Allergy status to narcotic agent status: Secondary | ICD-10-CM

## 2014-07-05 DIAGNOSIS — I10 Essential (primary) hypertension: Secondary | ICD-10-CM | POA: Diagnosis present

## 2014-07-05 LAB — BASIC METABOLIC PANEL
ANION GAP: 11 (ref 5–15)
BUN: 35 mg/dL — AB (ref 6–23)
CHLORIDE: 103 meq/L (ref 96–112)
CO2: 29 mEq/L (ref 19–32)
CREATININE: 0.84 mg/dL (ref 0.50–1.10)
Calcium: 9.3 mg/dL (ref 8.4–10.5)
GFR calc Af Amer: 78 mL/min — ABNORMAL LOW (ref >90)
GFR calc non Af Amer: 67 mL/min — ABNORMAL LOW (ref >90)
Glucose, Bld: 63 mg/dL — ABNORMAL LOW (ref 70–99)
Potassium: 4.8 mEq/L (ref 3.7–5.3)
Sodium: 143 mEq/L (ref 137–147)

## 2014-07-05 LAB — PRO B NATRIURETIC PEPTIDE: Pro B Natriuretic peptide (BNP): 1290 pg/mL — ABNORMAL HIGH (ref 0–125)

## 2014-07-05 LAB — CBC WITH DIFFERENTIAL/PLATELET
Basophils Absolute: 0.1 10*3/uL (ref 0.0–0.1)
Basophils Relative: 1 % (ref 0–1)
EOS ABS: 1.4 10*3/uL — AB (ref 0.0–0.7)
EOS PCT: 11 % — AB (ref 0–5)
HCT: 38.1 % (ref 36.0–46.0)
HEMOGLOBIN: 11.3 g/dL — AB (ref 12.0–15.0)
LYMPHS ABS: 2.1 10*3/uL (ref 0.7–4.0)
LYMPHS PCT: 18 % (ref 12–46)
MCH: 24.6 pg — AB (ref 26.0–34.0)
MCHC: 29.7 g/dL — ABNORMAL LOW (ref 30.0–36.0)
MCV: 83 fL (ref 78.0–100.0)
MONOS PCT: 10 % (ref 3–12)
Monocytes Absolute: 1.1 10*3/uL — ABNORMAL HIGH (ref 0.1–1.0)
NEUTROS PCT: 60 % (ref 43–77)
Neutro Abs: 7.1 10*3/uL (ref 1.7–7.7)
Platelets: 321 10*3/uL (ref 150–400)
RBC: 4.59 MIL/uL (ref 3.87–5.11)
RDW: 16.4 % — ABNORMAL HIGH (ref 11.5–15.5)
WBC: 11.8 10*3/uL — ABNORMAL HIGH (ref 4.0–10.5)

## 2014-07-05 LAB — GLUCOSE, CAPILLARY
Glucose-Capillary: 98 mg/dL (ref 70–99)
Glucose-Capillary: 85 mg/dL (ref 70–99)

## 2014-07-05 LAB — PROTIME-INR
INR: 1.79 — ABNORMAL HIGH (ref 0.00–1.49)
Prothrombin Time: 20.8 seconds — ABNORMAL HIGH (ref 11.6–15.2)

## 2014-07-05 MED ORDER — ESCITALOPRAM OXALATE 20 MG PO TABS
20.0000 mg | ORAL_TABLET | Freq: Every day | ORAL | Status: DC
  Administered 2014-07-06 – 2014-07-11 (×6): 20 mg via ORAL
  Filled 2014-07-05 (×7): qty 1

## 2014-07-05 MED ORDER — ADULT MULTIVITAMIN W/MINERALS CH
1.0000 | ORAL_TABLET | Freq: Every day | ORAL | Status: DC
  Administered 2014-07-11: 1 via ORAL
  Filled 2014-07-05 (×7): qty 1

## 2014-07-05 MED ORDER — RAMIPRIL 10 MG PO CAPS
10.0000 mg | ORAL_CAPSULE | Freq: Every day | ORAL | Status: DC
  Administered 2014-07-06 – 2014-07-11 (×6): 10 mg via ORAL
  Filled 2014-07-05 (×7): qty 1

## 2014-07-05 MED ORDER — WARFARIN SODIUM 5 MG PO TABS
5.0000 mg | ORAL_TABLET | Freq: Every day | ORAL | Status: DC
  Administered 2014-07-05 – 2014-07-07 (×3): 5 mg via ORAL
  Filled 2014-07-05 (×4): qty 1

## 2014-07-05 MED ORDER — INSULIN GLARGINE 100 UNIT/ML SOLOSTAR PEN: 20.0000 [IU] | PEN_INJECTOR | Freq: Two times a day (BID) | SUBCUTANEOUS | Status: DC

## 2014-07-05 MED ORDER — INSULIN ASPART 100 UNIT/ML ~~LOC~~ SOLN
0.0000 [IU] | Freq: Three times a day (TID) | SUBCUTANEOUS | Status: DC
  Administered 2014-07-07: 3 [IU] via SUBCUTANEOUS
  Administered 2014-07-08: 4 [IU] via SUBCUTANEOUS
  Administered 2014-07-08 – 2014-07-09 (×3): 3 [IU] via SUBCUTANEOUS
  Administered 2014-07-09: 4 [IU] via SUBCUTANEOUS
  Administered 2014-07-09: 3 [IU] via SUBCUTANEOUS
  Administered 2014-07-10 (×2): 4 [IU] via SUBCUTANEOUS
  Administered 2014-07-11: 3 [IU] via SUBCUTANEOUS

## 2014-07-05 MED ORDER — SODIUM CHLORIDE 0.9 % IJ SOLN
3.0000 mL | INTRAMUSCULAR | Status: DC | PRN
Start: 2014-07-05 — End: 2014-07-11

## 2014-07-05 MED ORDER — SODIUM CHLORIDE 0.9 % IV SOLN: 250.0000 mL | INTRAVENOUS | Status: DC | PRN

## 2014-07-05 MED ORDER — WARFARIN - PHYSICIAN DOSING INPATIENT: Freq: Every day | Status: DC

## 2014-07-05 MED ORDER — DILTIAZEM HCL 30 MG PO TABS
30.0000 mg | ORAL_TABLET | Freq: Two times a day (BID) | ORAL | Status: DC
  Administered 2014-07-05 – 2014-07-06 (×3): 30 mg via ORAL
  Filled 2014-07-05 (×7): qty 1

## 2014-07-05 MED ORDER — TICAGRELOR 90 MG PO TABS
90.0000 mg | ORAL_TABLET | Freq: Two times a day (BID) | ORAL | Status: DC
  Filled 2014-07-05: qty 1

## 2014-07-05 MED ORDER — ONDANSETRON HCL 4 MG/2ML IJ SOLN: 4.0000 mg | Freq: Four times a day (QID) | INTRAMUSCULAR | Status: DC | PRN

## 2014-07-05 MED ORDER — ATORVASTATIN CALCIUM 20 MG PO TABS
20.0000 mg | ORAL_TABLET | Freq: Every day | ORAL | Status: DC
  Administered 2014-07-05 – 2014-07-10 (×6): 20 mg via ORAL
  Filled 2014-07-05 (×7): qty 1

## 2014-07-05 MED ORDER — ACETAMINOPHEN 325 MG PO TABS: 650.0000 mg | ORAL_TABLET | ORAL | Status: DC | PRN

## 2014-07-05 MED ORDER — METFORMIN HCL 500 MG PO TABS
1000.0000 mg | ORAL_TABLET | Freq: Two times a day (BID) | ORAL | Status: DC
  Administered 2014-07-05 – 2014-07-06 (×3): 1000 mg via ORAL
  Filled 2014-07-05 (×6): qty 2

## 2014-07-05 MED ORDER — GLIPIZIDE 10 MG PO TABS
10.0000 mg | ORAL_TABLET | Freq: Two times a day (BID) | ORAL | Status: DC
  Administered 2014-07-05 – 2014-07-06 (×2): 10 mg via ORAL
  Filled 2014-07-05 (×6): qty 1

## 2014-07-05 MED ORDER — SODIUM CHLORIDE 0.9 % IJ SOLN
3.0000 mL | Freq: Two times a day (BID) | INTRAMUSCULAR | Status: DC
  Administered 2014-07-05 – 2014-07-09 (×9): 3 mL via INTRAVENOUS
  Administered 2014-07-09: 10:00:00 via INTRAVENOUS
  Administered 2014-07-10 (×2): 3 mL via INTRAVENOUS

## 2014-07-05 MED ORDER — LEVOTHYROXINE SODIUM 75 MCG PO TABS
75.0000 ug | ORAL_TABLET | Freq: Every day | ORAL | Status: DC
  Administered 2014-07-06 – 2014-07-11 (×6): 75 ug via ORAL
  Filled 2014-07-05 (×7): qty 1

## 2014-07-05 MED ORDER — SPIRONOLACTONE 25 MG PO TABS
25.0000 mg | ORAL_TABLET | Freq: Every day | ORAL | Status: DC
  Administered 2014-07-06 – 2014-07-11 (×6): 25 mg via ORAL
  Filled 2014-07-05 (×7): qty 1

## 2014-07-05 MED ORDER — DIGOXIN 125 MCG PO TABS
0.1250 mg | ORAL_TABLET | Freq: Every day | ORAL | Status: DC
  Administered 2014-07-06 – 2014-07-11 (×6): 0.125 mg via ORAL
  Filled 2014-07-05 (×7): qty 1

## 2014-07-05 MED ORDER — INSULIN GLARGINE 100 UNIT/ML ~~LOC~~ SOLN
20.0000 [IU] | Freq: Two times a day (BID) | SUBCUTANEOUS | Status: DC
  Filled 2014-07-05 (×5): qty 0.2

## 2014-07-05 MED ORDER — ASPIRIN 81 MG PO CHEW: 81.0000 mg | CHEWABLE_TABLET | Freq: Every day | ORAL | Status: DC

## 2014-07-05 MED ORDER — ALPRAZOLAM 0.5 MG PO TABS
0.5000 mg | ORAL_TABLET | Freq: Every evening | ORAL | Status: DC | PRN
  Administered 2014-07-05 – 2014-07-10 (×6): 0.5 mg via ORAL
  Filled 2014-07-05 (×6): qty 1

## 2014-07-05 MED ORDER — METOPROLOL TARTRATE 25 MG PO TABS
25.0000 mg | ORAL_TABLET | Freq: Two times a day (BID) | ORAL | Status: DC
  Administered 2014-07-05 – 2014-07-06 (×3): 25 mg via ORAL
  Filled 2014-07-05 (×7): qty 1

## 2014-07-05 MED ORDER — FUROSEMIDE 10 MG/ML IJ SOLN
40.0000 mg | Freq: Two times a day (BID) | INTRAMUSCULAR | Status: DC
  Administered 2014-07-05 – 2014-07-11 (×12): 40 mg via INTRAVENOUS
  Filled 2014-07-05 (×17): qty 4

## 2014-07-05 MED ORDER — FUROSEMIDE 10 MG/ML IJ SOLN
40.0000 mg | Freq: Once | INTRAMUSCULAR | Status: AC
  Administered 2014-07-05: 40 mg via INTRAVENOUS

## 2014-07-05 NOTE — Progress Notes (Signed)
  Echocardiogram 2D Echocardiogram has been performed.  Cathie BeamsGREGORY, Sharee Sturdy 07/05/2014, 3:19 PM

## 2014-07-05 NOTE — Progress Notes (Signed)
UR completed Derrian Poli K. Pelagia Iacobucci, RN, BSN, MSHL, CCM  07/05/2014 10:53 PM

## 2014-07-05 NOTE — Care Management Note (Addendum)
  Page 1 of 1   07/11/2014     2:31:29 PM CARE MANAGEMENT NOTE 07/11/2014  Patient:  Tracey [name],Tracey [name]   Account Number:  [id number]  Date Initiated:  07/05/2014  Documentation initiated by:  Mauri Temkin  Subjective/Objective Assessment:   CHF     Action/Plan:   CM to follow for disposition needs   Anticipated DC Date:  07/08/2014   Anticipated DC Plan:  HOME/SELF CARE      DC Planning Services  CM consult      Choice offered to / List presented to:             Status of service:  Completed, signed off Medicare Important Message given?  YES (If response is "NO", the following Medicare IM given date fields will be blank) Date Medicare IM given:  07/11/2014 Medicare IM given by:  Deondrick Searls Date Additional Medicare IM given:   Additional Medicare IM given by:    Discharge Disposition:  HOME/SELF CARE  Per UR Regulation:  Reviewed for med. necessity/level of care/duration of stay  If discussed at Long Length of Stay Meetings, dates discussed:    Comments:  Socorro Kanitz RN, BSN, MSHL, CCM  Nurse - Case Manager,  ([facility])  [phone]  07/07/2014 Initial IM 07/05/2014 provided by admissions ADM:  CHF Social:  From home.  Home Wound Care mgmt THN:  Initiated PT RECS:  None Dispo Plan:  Home / Self  Care.

## 2014-07-05 NOTE — H&P (Signed)
Referring Physician:  CYANI KALLSTROM is an 73 y.o. female.                       Chief Complaint: Shortness of breath x 2 days and leg edema x 2 weeks  HPI: 73 years old female has been SOB x 2 days. She has had fluid buildup in her lower extremities x 2 weeks. Denies fever, chills, body aches, chest pain, URI symptoms. Has known history of CHF, CAD, DM, II, Obesity, Dyslipidemia, Hypothyroidism, Arthritis and anemia.   Past Medical History  Diagnosis Date  . CHF (congestive heart failure)   . Atrial fibrillation   . Diabetes mellitus without complication   . Hypertension   . Thyroid disease   . Coronary artery disease   . Hypothyroidism   . Anxiety   . Shortness of breath     WITH MY CHF  . Arthritis     RT KNEE  . Anemia   . ACS (acute coronary syndrome) 10/27/2013  . Diverticulosis 10/22/2005    Qualifier: Diagnosis of  By: Nils Pyle CMA (AAMA), Mearl Latin    . HIATAL HERNIA 10/22/2005    Qualifier: Diagnosis of  By: Nils Pyle CMA ([place]), Mearl Latin    . HYPERCHOLESTEROLEMIA 03/09/2008    Qualifier: Diagnosis of  By: Nils Pyle CMA Deborra Medina), Mearl Latin        Past Surgical History  Procedure Laterality Date  . Abdominal hysterectomy    . Cardiac catheterization  10/27/2013  . Coronary angioplasty with stent placement  10/27/2013    DES   TO RCA       DR HARWANI  . Cholecystectomy    . Eye surgery    . Cataract extraction Bilateral 2014    Family History  Problem Relation Age of Onset  . Heart disease Father    Social History:  reports that she has quit smoking. She has never used smokeless tobacco. She reports that she does not drink alcohol or use illicit drugs.  Allergies:  Allergies  Allergen Reactions  . Codeine Nausea And Vomiting    Medications Prior to Admission  Medication Sig Dispense Refill  . ALPRAZolam (XANAX) 0.5 MG tablet Take 0.5 mg by mouth at bedtime as needed for sleep.      Marland Kitchen atorvastatin (LIPITOR) 20 MG tablet Take 1 tablet (20 mg total) by mouth daily.  90  tablet  3  . digoxin (LANOXIN) 0.125 MG tablet Take 1 tablet (0.125 mg total) by mouth daily.  30 tablet  1  . diltiazem (CARDIZEM) 30 MG tablet Take 30 mg by mouth 2 (two) times daily.      Marland Kitchen escitalopram (LEXAPRO) 20 MG tablet Take 20 mg by mouth daily.      . furosemide (LASIX) 40 MG tablet Take 1 tablet (40 mg total) by mouth daily.  30 tablet  3  . glipiZIDE (GLUCOTROL) 10 MG tablet Take 10 mg by mouth 2 (two) times daily before a meal.      . Insulin Glargine (LANTUS SOLOSTAR) 100 UNIT/ML Solostar Pen Inject 20 Units into the skin 2 (two) times daily.      Marland Kitchen levothyroxine (SYNTHROID, LEVOTHROID) 75 MCG tablet Take 1 tablet (75 mcg total) by mouth daily before breakfast.  30 tablet  3  . metFORMIN (GLUCOPHAGE) 500 MG tablet Take 2 tablets (1,000 mg total) by mouth 2 (two) times daily with a meal.  60 tablet  3  . metoprolol tartrate (LOPRESSOR) 25 MG tablet Take 1 tablet (  25 mg total) by mouth 2 (two) times daily.  60 tablet  3  . Multiple Vitamin (MULTIVITAMIN WITH MINERALS) TABS tablet Take 1 tablet by mouth daily.      . ramipril (ALTACE) 10 MG capsule Take 1 capsule (10 mg total) by mouth daily.  30 capsule  3  . spironolactone (ALDACTONE) 25 MG tablet Take 1 tablet (25 mg total) by mouth daily.  30 tablet  3  . warfarin (COUMADIN) 5 MG tablet Take 5 mg by mouth every evening.        Results for orders placed during the hospital encounter of 07/05/14 (from the past 48 hour(s))  GLUCOSE, CAPILLARY     Status: None   Collection Time    07/05/14 12:38 PM      Result Value Ref Range   Glucose-Capillary 98  70 - 99 mg/dL   Comment 1 Notify RN    CBC WITH DIFFERENTIAL     Status: Abnormal   Collection Time    07/05/14  2:00 PM      Result Value Ref Range   WBC 11.8 (*) 4.0 - 10.5 K/uL   RBC 4.59  3.87 - 5.11 MIL/uL   Hemoglobin 11.3 (*) 12.0 - 15.0 g/dL   HCT 38.1  36.0 - 46.0 %   MCV 83.0  78.0 - 100.0 fL   MCH 24.6 (*) 26.0 - 34.0 pg   MCHC 29.7 (*) 30.0 - 36.0 g/dL   RDW 16.4  (*) 11.5 - 15.5 %   Platelets 321  150 - 400 K/uL   Neutrophils Relative % 60  43 - 77 %   Neutro Abs 7.1  1.7 - 7.7 K/uL   Lymphocytes Relative 18  12 - 46 %   Lymphs Abs 2.1  0.7 - 4.0 K/uL   Monocytes Relative 10  3 - 12 %   Monocytes Absolute 1.1 (*) 0.1 - 1.0 K/uL   Eosinophils Relative 11 (*) 0 - 5 %   Eosinophils Absolute 1.4 (*) 0.0 - 0.7 K/uL   Basophils Relative 1  0 - 1 %   Basophils Absolute 0.1  0.0 - 0.1 K/uL  PROTIME-INR     Status: Abnormal   Collection Time    07/05/14  2:00 PM      Result Value Ref Range   Prothrombin Time 20.8 (*) 11.6 - 15.2 seconds   INR 1.79 (*) 0.00 - 5.63  BASIC METABOLIC PANEL     Status: Abnormal   Collection Time    07/05/14  2:00 PM      Result Value Ref Range   Sodium 143  137 - 147 mEq/L   Potassium 4.8  3.7 - 5.3 mEq/L   Chloride 103  96 - 112 mEq/L   CO2 29  19 - 32 mEq/L   Glucose, Bld 63 (*) 70 - 99 mg/dL   BUN 35 (*) 6 - 23 mg/dL   Creatinine, Ser 0.84  0.50 - 1.10 mg/dL   Calcium 9.3  8.4 - 10.5 mg/dL   GFR calc non Af Amer 67 (*) >90 mL/min   GFR calc Af Amer 78 (*) >90 mL/min   Comment: (NOTE)     The eGFR has been calculated using the CKD EPI equation.     This calculation has not been validated in all clinical situations.     eGFR's persistently <90 mL/min signify possible Chronic Kidney     Disease.   Anion gap 11  5 - 15  PRO  B NATRIURETIC PEPTIDE     Status: Abnormal   Collection Time    07/05/14  2:00 PM      Result Value Ref Range   Pro B Natriuretic peptide (BNP) 1290.0 (*) 0 - 125 pg/mL   No results found.  Review Of Systems The patient admits to weight gain. Has vision change with need to wear reading glasses. Bilateral cataract surgery. No hearing loss. No tinnitus. Positive full upper and lower dentures. No cough, hemoptysis, asthma, COPD or pneumonia. Positive history of palpitations, chest pain and leg edema. No history of nausea, vomiting, diarrhea, constipation, bleeding from the bowels, cancer,  hiatal hernia or hepatitis. Positive history of blood transfusion. No history of kidney stone, hematuria, stroke, seizures, psychiatric admissions. Positive history of joint pains.   Blood pressure 90/75, pulse 75, temperature 97.9 F (36.6 C), temperature source Oral, resp. rate 20, height 5' 3"  (1.6 m), weight 104.645 kg (230 lb 11.2 oz), SpO2 97.00%. Physical exam:  HEENT: The patient is normocephalic, atraumatic. Wears glasses. Has light  brown eyes. Bil. Lens implants. Pupils equal and reacting to light. Wears upper and lower dentures.  NECK: + JVD, no carotid bruit, full range of motion. Neck is nontender without lymphadenopathy and no thyromegaly.  LUNGS: Bilateral basal crackles. No wheezing. HEART: Normal S1, S2, with grade 2/6 systolic murmur.  ABDOMEN: Soft, nontender, distended with scars of surgery.  EXTREMITIES: 2+ edema, no cyanosis or clubbing. Spider veins bilaterally  with right knee swelling and tenderness bilaterally. Small 1/2" diameter weeping ulcer right lower leg. CNS: Cranial nerves grossly intact. Bilateral equal grips. The patient is  right-handed.  Skin:-Warm and dry.   Assessment/Plan Acute left heart systolic failure  Hypertension  DM, II  Obesity  Hyperlipidemia  Atrial fibrillation  Asthma, chronic Hypothyroidism CAD S/P stent  Admit/IV lasix/Home medications  Lamyiah Crawshaw S, MD  07/05/2014, 6:40 PM

## 2014-07-06 LAB — PROTIME-INR
INR: 1.78 — ABNORMAL HIGH (ref 0.00–1.49)
Prothrombin Time: 20.7 seconds — ABNORMAL HIGH (ref 11.6–15.2)

## 2014-07-06 LAB — BASIC METABOLIC PANEL
ANION GAP: 9 (ref 5–15)
BUN: 35 mg/dL — AB (ref 6–23)
CALCIUM: 9.2 mg/dL (ref 8.4–10.5)
CO2: 33 mEq/L — ABNORMAL HIGH (ref 19–32)
CREATININE: 1.06 mg/dL (ref 0.50–1.10)
Chloride: 101 mEq/L (ref 96–112)
GFR calc Af Amer: 59 mL/min — ABNORMAL LOW (ref >90)
GFR calc non Af Amer: 51 mL/min — ABNORMAL LOW (ref >90)
Glucose, Bld: 52 mg/dL — ABNORMAL LOW (ref 70–99)
Potassium: 4.1 mEq/L (ref 3.7–5.3)
Sodium: 143 mEq/L (ref 137–147)

## 2014-07-06 LAB — GLUCOSE, CAPILLARY
GLUCOSE-CAPILLARY: 73 mg/dL (ref 70–99)
GLUCOSE-CAPILLARY: 98 mg/dL (ref 70–99)
GLUCOSE-CAPILLARY: 75 mg/dL (ref 70–99)
Glucose-Capillary: 58 mg/dL — ABNORMAL LOW (ref 70–99)
Glucose-Capillary: 89 mg/dL (ref 70–99)
Glucose-Capillary: 70 mg/dL (ref 70–99)
Glucose-Capillary: 47 mg/dL — ABNORMAL LOW (ref 70–99)
Glucose-Capillary: 49 mg/dL — ABNORMAL LOW (ref 70–99)

## 2014-07-06 LAB — HEPATIC FUNCTION PANEL
ALT: 13 U/L (ref 0–35)
AST: 19 U/L (ref 0–37)
Albumin: 3.2 g/dL — ABNORMAL LOW (ref 3.5–5.2)
Alkaline Phosphatase: 68 U/L (ref 39–117)
BILIRUBIN DIRECT: 0.2 mg/dL (ref 0.0–0.3)
Indirect Bilirubin: 0.7 mg/dL (ref 0.3–0.9)
Total Bilirubin: 0.9 mg/dL (ref 0.3–1.2)
Total Protein: 7.6 g/dL (ref 6.0–8.3)

## 2014-07-06 LAB — HEMOGLOBIN A1C
Hgb A1c MFr Bld: 6.5 % — ABNORMAL HIGH (ref <5.7)
MEAN PLASMA GLUCOSE: 140 mg/dL — AB (ref <117)

## 2014-07-06 NOTE — Evaluation (Addendum)
Physical Therapy Evaluation Patient Details Name: Tracey MontaneBobbie W [name] MRN: [id number] DOB: 08/18/1941 Today's Date: 07/06/2014   History of Present Illness  Admitted with progressing SOB and LE swelling.  PMHx  DM CHF   Clinical Impression  Pt was steady and independent with all mobility including stairs.  Mild SOB after moderate activity, but did not limit function.  No further PT at this time.  Signing off. .     Follow Up Recommendations No PT follow up    Equipment Recommendations  None recommended by PT    Recommendations for Other Services       Precautions / Restrictions Precautions Precautions: None      Mobility  Bed Mobility Overal bed mobility: Independent                Transfers Overall transfer level: Modified independent                  Ambulation/Gait Ambulation/Gait assistance: Independent   Assistive device: None Gait Pattern/deviations: WFL(Within Functional Limits) Gait velocity: slower preferred Gait velocity interpretation: Below normal speed for age/gender General Gait Details: slow, but steady  Stairs Stairs: Yes Stairs assistance: Modified independent (Device/Increase time) Stair Management: One rail Left;Alternating pattern;Forwards Number of Stairs: 3 General stair comments: steady with rail  Wheelchair Mobility    Modified Rankin (Stroke Patients Only)       Balance Overall balance assessment: Needs assistance Sitting-balance support: No upper extremity supported Sitting balance-Leahy Scale: Normal     Standing balance support: No upper extremity supported Standing balance-Leahy Scale: Good                               Pertinent Vitals/Pain Pain Assessment: No/denies pain    Home Living Family/patient expects to be discharged to:: Private residence Living Arrangements: Alone Available Help at Discharge: Family;Available PRN/intermittently Type of Home: House Home Access: Stairs to  enter Entrance Stairs-Rails: Right;Left;Can reach both Entrance Stairs-Number of Steps: 3 Home Layout: One level Home Equipment: Cane - quad Additional Comments: uses cane when out in the community in case her right knee gives her issues.      Prior Function Level of Independence: Independent;Independent with assistive device(s)         Comments: pt does not drive at night any more, but generally takes care of herself and her own home.       Hand Dominance        Extremity/Trunk Assessment               Lower Extremity Assessment: Overall WFL for tasks assessed         Communication   Communication: No difficulties  Cognition Arousal/Alertness: Awake/alert Behavior During Therapy: WFL for tasks assessed/performed Overall Cognitive Status: Within Functional Limits for tasks assessed                      General Comments      Exercises        Assessment/Plan    PT Assessment Patient needs continued PT services  PT Diagnosis     PT Problem List Decreased activity tolerance  PT Treatment Interventions     PT Goals (Current goals can be found in the Care Plan section) Acute Rehab PT Goals PT Goal Formulation: No goals set, d/c therapy    Frequency     Barriers to discharge        Co-evaluation  End of Session   Activity Tolerance: Patient tolerated treatment well Patient left: Other (comment);with call bell/phone within reach (sitting EOB to eat dinner) Nurse Communication: Mobility status         Time: 1610-9604 PT Time Calculation (min): 20 min   Charges:   PT Evaluation $Initial PT Evaluation Tier I: 1 Procedure PT Treatments $Gait Training: 8-22 mins   PT G Codes:          Atwell Mcdanel, Eliseo Gum 07/06/2014, 5:34 PM 07/06/2014  [place], PT [phone]  (pager)

## 2014-07-06 NOTE — Progress Notes (Signed)
Hypoglycemic Event  CBG: 58  Treatment: 4oz of orange juice  Symptoms: none  Follow-up CBG: Time:0616 CBG Result:73  Possible Reasons for Event: decreased appetite  Comments/MD notified:Protocol followed    Tracey [name], Tracey [name]  Remember to initiate Hypoglycemia Order Set & complete

## 2014-07-06 NOTE — Progress Notes (Signed)
Patient evaluated for community based chronic disease management services with [facility] as a benefit of patient's [employer]. Spoke with patient and sister at bedside to explain [facility].  Services have been accepted with written consent.  Her son Ashley AkinDwain Reynolds is her authorized contact.  Patient is CHF managed by Dr Algie CofferKadakia but was admitted after having recurrent BLE edema for two weeks prior to admission.  Patient did indicate that she engaged Dr Algie CofferKadakia who attempted to stabilize her with medication adjustments prior to admission.  Patient will benefit from more education at home on early symptom recognition and action plan implementation.  An option for home lasix IV may be feasible if Dr Algie CofferKadakia is in agreement.  Patient will receive a post discharge transition of care call and will be evaluated for monthly home visits for assessments and disease process education.  Left contact information and [facility] literature at bedside. Made Inpatient Case Manager aware that [facility] following. Of note, [facility] Care Management services does not replace or interfere with any services that are arranged by inpatient case management or social work.  For additional questions or referrals please contact Anibal Hendersonim Henderson BSN RN [facility] Hospital Liaison at [phone].

## 2014-07-06 NOTE — Progress Notes (Signed)
Inpatient Diabetes Program Recommendations  AACE/ADA: New Consensus Statement on Inpatient Glycemic Control (2013)  Target Ranges:  Prepandial:   less than 140 mg/dL      Peak postprandial:   less than 180 mg/dL (1-2 hours)      Critically ill patients:  140 - 180 mg/dL   Reason for Visit: Hypoglycemia  Diabetes history: DM2 Outpatient Diabetes medications: Lantus 20 units bid, glipizide 10 mg bid, metformin 1000 mg bid Current orders for Inpatient glycemic control: Same as above + resistant tidwc  73 year old female admitted for SOB and leg edema.  Results for Erich MontaneIERCE, Jeyla W (MRN [id number]) as of 07/06/2014 14:25  Ref. Range 07/05/2014 12:38 07/05/2014 21:06 07/06/2014 05:53 07/06/2014 06:16 07/06/2014 11:03  Glucose-Capillary Latest Range: 70-99 mg/dL 98 85 58 (L) 73 70  Results for Erich MontaneIERCE, Nakeyia W (MRN [id number]) as of 07/06/2014 14:25  Ref. Range 04/07/2014 15:48 07/05/2014 14:00 07/06/2014 05:48  Glucose Latest Range: 70-99 mg/dL 85 63 (L) 52 (L)   Hypoglycemia x 2 days. Good glucose control at home.   Inpatient Diabetes Program Recommendations Insulin - Basal: Decrease Lantus to 16 units bid HgbA1C: 6.5% good glycemic control D/C glipizide 10 mg bid to prevent hypoglycemia  Note: Will continue to follow. Also followed by [facility]. Thank you. Ailene Ardshonda Daemion Mcniel, RD, LDN, CDE Inpatient Diabetes Coordinator [phone]

## 2014-07-06 NOTE — Progress Notes (Signed)
Ref: Oliver Barre, MD   Subjective:  Feeling better. No shortness of breath. Leg edema is down by 50 %. Diuresed with I & O negative 1.7 L. No chest pain.  Objective:  Vital Signs in the last 24 hours: Temp:  [97.6 F (36.4 C)-97.8 F (36.6 C)] 97.6 F (36.4 C) (08/20 0534) Pulse Rate:  [60-97] 97 (08/20 0931) Cardiac Rhythm:  [-] Atrial fibrillation;Atrial flutter (08/20 0800) Resp:  [18-20] 20 (08/20 0534) BP: (114-135)/(64-78) 135/78 mmHg (08/20 0931) SpO2:  [92 %-98 %] 92 % (08/20 0534) Weight:  [101.7 kg (224 lb 3.3 oz)] 101.7 kg (224 lb 3.3 oz) (08/20 0534)  Physical Exam: BP Readings from Last 1 Encounters:  07/06/14 135/78    Wt Readings from Last 1 Encounters:  07/06/14 101.7 kg (224 lb 3.3 oz)    Weight change:   HEENT: [place]/AT, Eyes-Brown, PERL, EOMI, Conjunctiva-Pink, Sclera-Non-icteric Neck: No JVD, No bruit, Trachea midline. Lungs:  Clearing, Bilateral. Cardiac:  Regular rhythm, normal S1 and S2, no S3. II/VI systolic murmur. Abdomen:  Soft, non-tender. Extremities:  1-2 + edema present. No cyanosis. No clubbing. Dry right leg wound. CNS: AxOx3, Cranial nerves grossly intact, moves all 4 extremities. Right handed. Skin: Warm and dry.   Intake/Output from previous day: 08/19 0701 - 08/20 0700 In: 720 [P.O.:720] Out: 2450 [Urine:2450]    Lab Results: BMET    Component Value Date/Time   NA 143 07/06/2014 0548   NA 143 07/05/2014 1400   NA 138 04/07/2014 1548   K 4.1 07/06/2014 0548   K 4.8 07/05/2014 1400   K 3.9 04/07/2014 1548   CL 101 07/06/2014 0548   CL 103 07/05/2014 1400   CL 99 04/07/2014 1548   CO2 33* 07/06/2014 0548   CO2 29 07/05/2014 1400   CO2 33* 04/07/2014 1548   GLUCOSE 52* 07/06/2014 0548   GLUCOSE 63* 07/05/2014 1400   GLUCOSE 85 04/07/2014 1548   BUN 35* 07/06/2014 0548   BUN 35* 07/05/2014 1400   BUN 20 04/07/2014 1548   CREATININE 1.06 07/06/2014 0548   CREATININE 0.84 07/05/2014 1400   CREATININE 0.8 04/07/2014 1548   CALCIUM 9.2 07/06/2014  0548   CALCIUM 9.3 07/05/2014 1400   CALCIUM 9.0 04/07/2014 1548   GFRNONAA 51* 07/06/2014 0548   GFRNONAA 67* 07/05/2014 1400   GFRNONAA >90 10/28/2013 0525   GFRAA 59* 07/06/2014 0548   GFRAA 78* 07/05/2014 1400   GFRAA >90 10/28/2013 0525   CBC    Component Value Date/Time   WBC 11.8* 07/05/2014 1400   RBC 4.59 07/05/2014 1400   HGB 11.3* 07/05/2014 1400   HCT 38.1 07/05/2014 1400   PLT 321 07/05/2014 1400   MCV 83.0 07/05/2014 1400   MCH 24.6* 07/05/2014 1400   MCHC 29.7* 07/05/2014 1400   RDW 16.4* 07/05/2014 1400   LYMPHSABS 2.1 07/05/2014 1400   MONOABS 1.1* 07/05/2014 1400   EOSABS 1.4* 07/05/2014 1400   BASOSABS 0.1 07/05/2014 1400   HEPATIC Function Panel  Recent Labs  09/17/13 0545 04/07/14 1548  PROT 7.8 7.4   HEMOGLOBIN A1C No components found with this basename: HGA1C,  MPG   CARDIAC ENZYMES No results found for this basename: CKTOTAL, CKMB, CKMBINDEX, TROPONINI   BNP  Recent Labs  09/18/13 0545 09/19/13 0415 07/05/14 1400  PROBNP 2207.0* 1879.0* 1290.0*   TSH  Recent Labs  09/16/13 0415 04/07/14 1548  TSH 3.250 1.59   CHOLESTEROL  Recent Labs  04/07/14 1548  CHOL 202*  Scheduled Meds: . atorvastatin  20 mg Oral q1800  . digoxin  0.125 mg Oral Daily  . diltiazem  30 mg Oral BID  . escitalopram  20 mg Oral Daily  . furosemide  40 mg Intravenous Q12H  . glipiZIDE  10 mg Oral BID AC  . insulin aspart  0-20 Units Subcutaneous TID WC  . insulin glargine  20 Units Subcutaneous BID  . levothyroxine  75 mcg Oral QAC breakfast  . metFORMIN  1,000 mg Oral BID WC  . metoprolol tartrate  25 mg Oral BID  . multivitamin with minerals  1 tablet Oral Daily  . ramipril  10 mg Oral Daily  . sodium chloride  3 mL Intravenous Q12H  . spironolactone  25 mg Oral Daily  . warfarin  5 mg Oral q1800  . Warfarin - Physician Dosing Inpatient   Does not apply q1800   Continuous Infusions:  PRN Meds:.sodium chloride, acetaminophen, ALPRAZolam, ondansetron  (ZOFRAN) IV, sodium chloride  Assessment/Plan: Acute left heart systolic failure  Hypertension  DM, II  Obesity  Hyperlipidemia  Atrial fibrillation  Asthma, chronic  Hypothyroidism  CAD  S/P stent  Check LFT/Albumin.   LOS: 1 day    Tracey CobbAjay Shariq Puig  MD  07/06/2014, 1:15 PM

## 2014-07-07 LAB — BASIC METABOLIC PANEL
Anion gap: 14 (ref 5–15)
BUN: 35 mg/dL — ABNORMAL HIGH (ref 6–23)
CO2: 30 meq/L (ref 19–32)
Calcium: 9 mg/dL (ref 8.4–10.5)
Chloride: 97 mEq/L (ref 96–112)
Creatinine, Ser: 0.98 mg/dL (ref 0.50–1.10)
GFR calc Af Amer: 65 mL/min — ABNORMAL LOW (ref >90)
GFR calc non Af Amer: 56 mL/min — ABNORMAL LOW (ref >90)
GLUCOSE: 63 mg/dL — AB (ref 70–99)
Potassium: 4.1 mEq/L (ref 3.7–5.3)
SODIUM: 141 meq/L (ref 137–147)

## 2014-07-07 LAB — GLUCOSE, CAPILLARY
GLUCOSE-CAPILLARY: 61 mg/dL — AB (ref 70–99)
GLUCOSE-CAPILLARY: 61 mg/dL — AB (ref 70–99)
GLUCOSE-CAPILLARY: 96 mg/dL (ref 70–99)
GLUCOSE-CAPILLARY: 137 mg/dL — AB (ref 70–99)
GLUCOSE-CAPILLARY: 173 mg/dL — AB (ref 70–99)
Glucose-Capillary: 59 mg/dL — ABNORMAL LOW (ref 70–99)
Glucose-Capillary: 110 mg/dL — ABNORMAL HIGH (ref 70–99)
Glucose-Capillary: 105 mg/dL — ABNORMAL HIGH (ref 70–99)

## 2014-07-07 LAB — PROTIME-INR
INR: 2.07 — ABNORMAL HIGH (ref 0.00–1.49)
Prothrombin Time: 23.3 seconds — ABNORMAL HIGH (ref 11.6–15.2)

## 2014-07-07 MED ORDER — INSULIN GLARGINE 100 UNIT/ML ~~LOC~~ SOLN
16.0000 [IU] | Freq: Two times a day (BID) | SUBCUTANEOUS | Status: DC
  Filled 2014-07-07 (×2): qty 0.16

## 2014-07-07 MED ORDER — CEPHALEXIN 500 MG PO CAPS
500.0000 mg | ORAL_CAPSULE | Freq: Three times a day (TID) | ORAL | Status: DC
  Administered 2014-07-07 – 2014-07-11 (×12): 500 mg via ORAL
  Filled 2014-07-07 (×15): qty 1

## 2014-07-07 MED ORDER — GLUCOSE 40 % PO GEL
ORAL | Status: AC
  Administered 2014-07-07: 37.5 g
  Filled 2014-07-07: qty 1

## 2014-07-07 MED ORDER — CARVEDILOL 6.25 MG PO TABS
6.2500 mg | ORAL_TABLET | Freq: Two times a day (BID) | ORAL | Status: DC
  Administered 2014-07-07 – 2014-07-11 (×8): 6.25 mg via ORAL
  Filled 2014-07-07 (×10): qty 1

## 2014-07-07 MED ORDER — INSULIN GLARGINE 100 UNIT/ML ~~LOC~~ SOLN
10.0000 [IU] | Freq: Two times a day (BID) | SUBCUTANEOUS | Status: DC
  Administered 2014-07-07 – 2014-07-11 (×7): 10 [IU] via SUBCUTANEOUS
  Filled 2014-07-07 (×10): qty 0.1

## 2014-07-07 MED ORDER — CLOPIDOGREL BISULFATE 75 MG PO TABS
75.0000 mg | ORAL_TABLET | Freq: Every day | ORAL | Status: DC
  Administered 2014-07-07 – 2014-07-11 (×5): 75 mg via ORAL
  Filled 2014-07-07 (×6): qty 1

## 2014-07-07 NOTE — Progress Notes (Addendum)
Inpatient Diabetes Program Recommendations  AACE/ADA: New Consensus Statement on Inpatient Glycemic Control (2013)  Target Ranges:  Prepandial:   less than 140 mg/dL      Peak postprandial:   less than 180 mg/dL (1-2 hours)      Critically ill patients:  140 - 180 mg/dL  Results for Tracey MontaneIERCE, Tracey [name] (MRN [id number]) as of 07/07/2014 09:38  Ref. Range 07/07/2014 01:59 07/07/2014 02:23 07/07/2014 02:56 07/07/2014 06:29 07/07/2014 07:02  Glucose-Capillary Latest Range: 70-99 mg/dL 61 (L) 61 (L) 96 59 (L) 110 (H)   Reason for assessment; repeated low bg's  Diabetes history: Type 2 Outpatient Diabetes medications: Lantus 20 units bid, glipizide 10 mg bid, metformin 1000 mg bid.  Current orders for Inpatient glycemic control: Same as above + resistant Novolog correction 0-20 units  On initial call to Dr. Sharyn [name] I received orders for D/C glucotrol and decrease Lantus.  After further assessment and given that patient's BG have remained consistently low without insulin,  I spoke with Dr. Sharyn [name] by phone [phone]).  We have agreed that patient does not need Lantus insulin or glipizide at all.  With history of CHF MD may D/C Metformin as well.  MD will come and write the orders.    Tracey RacerJulie Kniyah Khun, RN, BA, MHA, CDE Diabetes Coordinator Inpatient Diabetes Program  [phone] (Team Pager) [phone] Tracey [name](Tracey [name] Office) 07/07/2014 9:39 AM

## 2014-07-07 NOTE — Progress Notes (Signed)
Hypoglycemic Event  CBG: 49  Treatment: 15 GM carbohydrate snack  Symptoms: Sweaty and Hungry  Follow-up CBG: 10:15 CBG Result:75  Possible Reasons for Event: Inadequate meal intake  Comments/MD notified:none    Tracey [name], Chucky MayRonnie Aguayo  Remember to initiate Hypoglycemia Order Set & complete

## 2014-07-07 NOTE — Progress Notes (Signed)
Subjective:  Patient complains of leg swelling and small blister over the leg.  States breathing has improved.  States had episode of hypoglycemia.  Denies any chest pain.  Noted to have markedly depressed LV systolic function recently.  Patient remains in A. Fib with controlled went to her response and DES in December 2014 off all antiplatelet medications.  States had problems bleeding within plantar and aspirin so switched to Coumadin  Objective:  Vital Signs in the last 24 hours: Temp:  [97.4 F (36.3 C)-98.1 F (36.7 C)] 97.4 F (36.3 C) (08/21 0650) Pulse Rate:  [77-91] 81 (08/21 0650) Resp:  [18] 18 (08/21 0650) BP: (95-127)/(48-75) 127/63 mmHg (08/21 0650) SpO2:  [93 %-95 %] 93 % (08/21 0650) Weight:  [100.835 kg (222 lb 4.8 oz)] 100.835 kg (222 lb 4.8 oz) (08/21 0650)  Intake/Output from previous day: 08/20 0701 - 08/21 0700 In: 1080 [P.O.:1080] Out: 1200 [Urine:1200] Intake/Output from this shift:    Physical Exam: Neck: no adenopathy, no carotid bruit, no JVD and supple, symmetrical, trachea midline Lungs: decreased breath sounds at bases with bibasilar rales Heart: irregularly irregular rhythm, S1, S2 normal and soft systolic murmur and S3 gallop noted Abdomen: soft, non-tender; bowel sounds normal; no masses,  no organomegaly Extremities: no clubbing cyanosis 2+ edema with small blister right leg with minimal erythema noted  Lab Results:  Recent Labs  07/05/14 1400  WBC 11.8*  HGB 11.3*  PLT 321    Recent Labs  07/06/14 0548 07/07/14 0600  NA 143 141  K 4.1 4.1  CL 101 97  CO2 33* 30  GLUCOSE 52* 63*  BUN 35* 35*  CREATININE 1.06 0.98   No results found for this basename: TROPONINI, CK, MB,  in the last 72 hours Hepatic Function Panel  Recent Labs  07/06/14 1200  PROT 7.6  ALBUMIN 3.2*  AST 19  ALT 13  ALKPHOS 68  BILITOT 0.9  BILIDIR 0.2  IBILI 0.7   No results found for this basename: CHOL,  in the last 72 hours No results found for  this basename: PROTIME,  in the last 72 hours  Imaging: Imaging results have been reviewed and No results found.  Cardiac Studies:  Assessment/Plan:  Resolving decompensated acute systolic congestive heart failure Coronary artery disease status post PCI to mid RCA in December 2014 Ischemic cardiomyopathy Hypertension Diabetes mellitus Hypercholesterolemia Chronic atrial fibrillation Hypercholesterolemia Morbid obesity Hypothyroidism Mild cellulitis right leg Plan Will DC Cardizem in view of depressed LV systolic function Add carvedilol 3.125 mg twice daily Hold oral hypoglycemic agents.  And reduce Lantus to 10 units twice daily. Add Lasix as per orders Start clopidogrel 75 mg daily Start Keflex as per orders  LOS: 2 days    Morrie Daywalt N 07/07/2014, 10:11 AM

## 2014-07-07 NOTE — Progress Notes (Signed)
Hypoglycemic Event  CBG: 61  Treatment: 1 tube instant glucose  Symptoms: None  Follow-up CBG: Time:0300 CBG Result:96mg /dl   Possible Reasons for Event: medication regimen   Comments/MD notified:none    Kandiss Ihrig, Chucky MayRonnie Aguayo  Remember to initiate Hypoglycemia Order Set & complete

## 2014-07-07 NOTE — Progress Notes (Signed)
Heart Failure Navigator Consult Note  Presentation: Tracey [name] is a 73 years old female has been SOB x 2 days. She has had fluid buildup in her lower extremities x 2 weeks. Denies fever, chills, body aches, chest pain, URI symptoms. Has known history of CHF, CAD, DM, II, Obesity, Dyslipidemia, Hypothyroidism, Arthritis and anemia.   Past Medical History  Diagnosis Date  . CHF (congestive heart failure)   . Atrial fibrillation   . Diabetes mellitus without complication   . Hypertension   . Thyroid disease   . Coronary artery disease   . Hypothyroidism   . Anxiety   . Shortness of breath     WITH MY CHF  . Arthritis     RT KNEE  . Anemia   . ACS (acute coronary syndrome) 10/27/2013  . Diverticulosis 10/22/2005    Qualifier: Diagnosis of  By: Koleen DistanceKowalk CMA (AAMA), Hulan SaasLeisha    . HIATAL HERNIA 10/22/2005    Qualifier: Diagnosis of  By: Koleen DistanceKowalk CMA (AAMA), Hulan SaasLeisha    . HYPERCHOLESTEROLEMIA 03/09/2008    Qualifier: Diagnosis of  By: Koleen DistanceKowalk CMA Duncan Dull(AAMA), Hulan SaasLeisha      History   Social History  . Marital Status: Widowed    Spouse Name: N/A    Number of Children: N/A  . Years of Education: N/A   Social History Main Topics  . Smoking status: Former Games developermoker  . Smokeless tobacco: Never Used     Comment: " I quit smoking along time ago "  . Alcohol Use: No  . Drug Use: No  . Sexual Activity: None   Other Topics Concern  . None   Social History Narrative  . None    ECHO:Study Conclusions--07/05/14  - Left ventricle: The cavity size was normal. There was moderate concentric hypertrophy. Systolic function was moderately to severely reduced. The estimated ejection fraction was in the range of 30% to 35%. There is mild hypokinesis of the anterior myocardium. There is moderate hypokinesis of the entireanteroseptal myocardium. Doppler parameters are consistent with abnormal left ventricular relaxation (grade 1 diastolic dysfunction). - Aortic valve: There was trivial regurgitation. -  Mitral valve: Calcified annulus. Mildly thickened leaflets . There was moderate regurgitation. - Left atrium: The atrium was moderately dilated. - Right atrium: The atrium was mildly dilated. - Pulmonary arteries: Systolic pressure was moderately increased. PA peak pressure: 55 mm Hg (S). - Pericardium, extracardiac: A trivial pericardial effusion was identified posterior to the heart.  Echocardiography. M-mode, complete 2D, spectral Doppler, and color Doppler. Birthdate: Patient birthdate: 02-03-1941. Age: Patient is 73 yr old. Sex: Gender: female. BMI: 40.8 kg/m^2. Blood pressure: 90/75 Patient status: Inpatient. Study date: Study date: 07/05/2014. Study time: 02:31 PM. Location: Bedside.   BNP    Component Value Date/Time   PROBNP 1290.0* 07/05/2014 1400    Education Assessment and Provision:  Detailed education and instructions provided on heart failure disease management including the following:  Signs and symptoms of Heart Failure When to call the physician Importance of daily weights Low sodium diet Fluid restriction Medication management Anticipated future follow-up appointments  Patient education given on each of the above topics.  Patient acknowledges understanding and acceptance of all instructions.  I spoke at length with Tracey [name] about her HF.  She lives alone and is quite [employer]active--still grocery shops and does her own cooking.  She has a scale and weighs daily--admits that she had "picked up some weight" and was having increased swelling in her legs--for which she saw Dr. Algie CofferKadakia.  She was able to teach back most topics listed above and claims to eat mostly low sodium diet.  I will see her again if she remains in hospital on Monday to reinforce education.    Education Materials:  "Living Better With Heart Failure" Booklet, Daily Weight Tracker Tool   High Risk Criteria for Readmission and/or Poor Patient Outcomes:   EF <30%-No 30-35%  2 or more admissions in 6  months-No  Difficult social situation- No  Demonstrates medication noncompliance- No  Barriers of Care:  Knowledge, compliance  Discharge Planning:   Plans to discharge to home alone--family local for support

## 2014-07-07 NOTE — Progress Notes (Signed)
Hypoglycemic Event  CBG:59  Treatment: 1 tube instant glucose  Symptoms: None  Follow-up CBG: Time:0700 CBG Result:110  Possible Reasons for Event: medication regimen  Comments/MD notified:none    Tracey [name], Tracey [name]  Remember to initiate Hypoglycemia Order Set & complete

## 2014-07-08 LAB — GLUCOSE, CAPILLARY
Glucose-Capillary: 137 mg/dL — ABNORMAL HIGH (ref 70–99)
Glucose-Capillary: 173 mg/dL — ABNORMAL HIGH (ref 70–99)
Glucose-Capillary: 141 mg/dL — ABNORMAL HIGH (ref 70–99)
Glucose-Capillary: 131 mg/dL — ABNORMAL HIGH (ref 70–99)

## 2014-07-08 LAB — BASIC METABOLIC PANEL
ANION GAP: 11 (ref 5–15)
BUN: 30 mg/dL — ABNORMAL HIGH (ref 6–23)
CALCIUM: 9 mg/dL (ref 8.4–10.5)
CO2: 33 mEq/L — ABNORMAL HIGH (ref 19–32)
Chloride: 97 mEq/L (ref 96–112)
Creatinine, Ser: 0.98 mg/dL (ref 0.50–1.10)
GFR calc Af Amer: 65 mL/min — ABNORMAL LOW (ref >90)
GFR, EST NON AFRICAN AMERICAN: 56 mL/min — AB (ref >90)
Glucose, Bld: 140 mg/dL — ABNORMAL HIGH (ref 70–99)
Potassium: 4.3 mEq/L (ref 3.7–5.3)
Sodium: 141 mEq/L (ref 137–147)

## 2014-07-08 LAB — CBC
HEMATOCRIT: 36.9 % (ref 36.0–46.0)
Hemoglobin: 11 g/dL — ABNORMAL LOW (ref 12.0–15.0)
MCH: 24.1 pg — ABNORMAL LOW (ref 26.0–34.0)
MCHC: 29.8 g/dL — AB (ref 30.0–36.0)
MCV: 80.9 fL (ref 78.0–100.0)
PLATELETS: 307 10*3/uL (ref 150–400)
RBC: 4.56 MIL/uL (ref 3.87–5.11)
RDW: 16.2 % — AB (ref 11.5–15.5)
WBC: 9.6 10*3/uL (ref 4.0–10.5)

## 2014-07-08 LAB — PROTIME-INR
INR: 2.31 — AB (ref 0.00–1.49)
PROTHROMBIN TIME: 25.4 s — AB (ref 11.6–15.2)

## 2014-07-08 LAB — PRO B NATRIURETIC PEPTIDE: PRO B NATRI PEPTIDE: 603 pg/mL — AB (ref 0–125)

## 2014-07-08 MED ORDER — WARFARIN SODIUM 4 MG PO TABS
4.0000 mg | ORAL_TABLET | Freq: Every day | ORAL | Status: DC
  Administered 2014-07-08: 4 mg via ORAL
  Filled 2014-07-08 (×2): qty 1

## 2014-07-08 NOTE — Progress Notes (Signed)
Subjective:  Patient denies any chest pain states breathing is improved and her leg swelling is also better. No further episodes of hypoglycemia.  Objective:  Vital Signs in the last 24 hours: Temp:  [97.4 F (36.3 C)-97.7 F (36.5 C)] 97.7 F (36.5 C) (08/22 0418) Pulse Rate:  [85-99] 89 (08/22 0418) Resp:  [18-20] 18 (08/22 0418) BP: (98-123)/(51-81) 98/76 mmHg (08/22 0418) SpO2:  [93 %-94 %] 94 % (08/22 0418) Weight:  [98.703 kg (217 lb 9.6 oz)] 98.703 kg (217 lb 9.6 oz) (08/22 0418)  Intake/Output from previous day: 08/21 0701 - 08/22 0700 In: 960 [P.O.:960] Out: 2800 [Urine:2800] Intake/Output from this shift: Total I/O In: 240 [P.O.:240] Out: -   Physical Exam: Neck: no adenopathy, no carotid bruit, no JVD and supple, symmetrical, trachea midline Lungs: Decreased breath sound at bases with bibasilar rales Heart: irregularly irregular rhythm, S1, S2 normal and Soft systolic murmur and S3 gallop noted Abdomen: soft, non-tender; bowel sounds normal; no masses,  no organomegaly Extremities: No clubbing cyanosis 1+ edema noted  Lab Results:  Recent Labs  07/05/14 1400 07/08/14 0409  WBC 11.8* 9.6  HGB 11.3* 11.0*  PLT 321 307    Recent Labs  07/07/14 0600 07/08/14 0409  NA 141 141  K 4.1 4.3  CL 97 97  CO2 30 33*  GLUCOSE 63* 140*  BUN 35* 30*  CREATININE 0.98 0.98   No results found for this basename: TROPONINI, CK, MB,  in the last 72 hours Hepatic Function Panel  Recent Labs  07/06/14 1200  PROT 7.6  ALBUMIN 3.2*  AST 19  ALT 13  ALKPHOS 68  BILITOT 0.9  BILIDIR 0.2  IBILI 0.7   No results found for this basename: CHOL,  in the last 72 hours No results found for this basename: PROTIME,  in the last 72 hours  Imaging: Imaging results have been reviewed and No results found.  Cardiac Studies:  Assessment/Plan:  Resolving decompensated acute systolic congestive heart failure  Coronary artery disease status post PCI to mid RCA in December  2014  Ischemic cardiomyopathy  Hypertension  Diabetes mellitus  Hypercholesterolemia  Chronic atrial fibrillation  Hypercholesterolemia  Morbid obesity  Hypothyroidism  Mild cellulitis right leg  Plan Continue present management Reduce warfarin to 4 mg daily.   LOS: 3 days    Loa Idler N 07/08/2014, 10:22 AM

## 2014-07-09 LAB — BASIC METABOLIC PANEL
Anion gap: 12 (ref 5–15)
BUN: 31 mg/dL — AB (ref 6–23)
CO2: 33 mEq/L — ABNORMAL HIGH (ref 19–32)
CREATININE: 0.93 mg/dL (ref 0.50–1.10)
Calcium: 9.1 mg/dL (ref 8.4–10.5)
Chloride: 97 mEq/L (ref 96–112)
GFR calc non Af Amer: 60 mL/min — ABNORMAL LOW (ref >90)
GFR, EST AFRICAN AMERICAN: 69 mL/min — AB (ref >90)
Glucose, Bld: 163 mg/dL — ABNORMAL HIGH (ref 70–99)
Potassium: 3.8 mEq/L (ref 3.7–5.3)
Sodium: 142 mEq/L (ref 137–147)

## 2014-07-09 LAB — GLUCOSE, CAPILLARY
Glucose-Capillary: 146 mg/dL — ABNORMAL HIGH (ref 70–99)
Glucose-Capillary: 165 mg/dL — ABNORMAL HIGH (ref 70–99)
Glucose-Capillary: 149 mg/dL — ABNORMAL HIGH (ref 70–99)
Glucose-Capillary: 135 mg/dL — ABNORMAL HIGH (ref 70–99)

## 2014-07-09 LAB — PRO B NATRIURETIC PEPTIDE: Pro B Natriuretic peptide (BNP): 600.7 pg/mL — ABNORMAL HIGH (ref 0–125)

## 2014-07-09 LAB — PROTIME-INR
INR: 1.81 — AB (ref 0.00–1.49)
Prothrombin Time: 21 seconds — ABNORMAL HIGH (ref 11.6–15.2)

## 2014-07-09 MED ORDER — FUROSEMIDE 10 MG/ML IJ SOLN
40.0000 mg | Freq: Once | INTRAMUSCULAR | Status: AC
  Administered 2014-07-09: 40 mg via INTRAVENOUS

## 2014-07-09 MED ORDER — WARFARIN SODIUM 5 MG PO TABS
5.0000 mg | ORAL_TABLET | Freq: Every day | ORAL | Status: DC
  Administered 2014-07-09: 5 mg via ORAL
  Filled 2014-07-09 (×3): qty 1

## 2014-07-09 NOTE — Progress Notes (Signed)
Subjective:   Patient denies any chest pain states leg swelling and leg ulcer has improved. Breathing also has improved. Objective:  Vital Signs in the last 24 hours: Temp:  [97.8 F (36.6 C)-98.5 F (36.9 C)] 97.8 F (36.6 C) (08/23 0952) Pulse Rate:  [70-88] 81 (08/23 0952) Resp:  [18] 18 (08/23 0952) BP: (98-124)/(59-88) 124/71 mmHg (08/23 0952) SpO2:  [93 %-96 %] 93 % (08/23 0952) Weight:  [97.115 kg (214 lb 1.6 oz)] 97.115 kg (214 lb 1.6 oz) (08/23 0500)  Intake/Output from previous day: 08/22 0701 - 08/23 0700 In: 480 [P.O.:480] Out: 3575 [Urine:3575] Intake/Output from this shift: Total I/O In: 3 [I.V.:3] Out: -   Physical Exam: Neck: no adenopathy, no carotid bruit, no JVD and supple, symmetrical, trachea midline Lungs: Decreased breath sound at bases with faint rales Heart: irregularly irregular rhythm, S1, S2 normal and Soft systolic murmur and S3 gallop noted Abdomen: soft, non-tender; bowel sounds normal; no masses,  no organomegaly Extremities: No clubbing cyanosis 1+ edema noted right leg surgical dressing  Lab Results:  Recent Labs  07/08/14 0409  WBC 9.6  HGB 11.0*  PLT 307    Recent Labs  07/08/14 0409 07/09/14 0345  NA 141 142  K 4.3 3.8  CL 97 97  CO2 33* 33*  GLUCOSE 140* 163*  BUN 30* 31*  CREATININE 0.98 0.93   No results found for this basename: TROPONINI, CK, MB,  in the last 72 hours Hepatic Function Panel  Recent Labs  07/06/14 1200  PROT 7.6  ALBUMIN 3.2*  AST 19  ALT 13  ALKPHOS 68  BILITOT 0.9  BILIDIR 0.2  IBILI 0.7   No results found for this basename: CHOL,  in the last 72 hours No results found for this basename: PROTIME,  in the last 72 hours  Imaging: Imaging results have been reviewed and No results found.  Cardiac Studies:  Assessment/Plan:  Resolving decompensated acute systolic congestive heart failure  Coronary artery disease status post PCI to mid RCA in December 2014  Ischemic cardiomyopathy   Hypertension  Diabetes mellitus  Hypercholesterolemia  Chronic atrial fibrillation  Hypercholesterolemia  Morbid obesity  Hypothyroidism  Resolving Mild cellulitis right leg  Plan Increase Coumadin to 5 mg daily Check labs in a.m. 1 extra dose IV Lasix today   LOS: 4 days    Amil Bouwman N 07/09/2014, 11:17 AM

## 2014-07-10 LAB — BASIC METABOLIC PANEL
ANION GAP: 12 (ref 5–15)
BUN: 32 mg/dL — ABNORMAL HIGH (ref 6–23)
CHLORIDE: 95 meq/L — AB (ref 96–112)
CO2: 32 mEq/L (ref 19–32)
Calcium: 8.6 mg/dL (ref 8.4–10.5)
Creatinine, Ser: 0.95 mg/dL (ref 0.50–1.10)
GFR calc non Af Amer: 58 mL/min — ABNORMAL LOW (ref >90)
GFR, EST AFRICAN AMERICAN: 67 mL/min — AB (ref >90)
Glucose, Bld: 135 mg/dL — ABNORMAL HIGH (ref 70–99)
Potassium: 3.6 mEq/L — ABNORMAL LOW (ref 3.7–5.3)
Sodium: 139 mEq/L (ref 137–147)

## 2014-07-10 LAB — GLUCOSE, CAPILLARY
GLUCOSE-CAPILLARY: 171 mg/dL — AB (ref 70–99)
Glucose-Capillary: 167 mg/dL — ABNORMAL HIGH (ref 70–99)
Glucose-Capillary: 117 mg/dL — ABNORMAL HIGH (ref 70–99)
Glucose-Capillary: 184 mg/dL — ABNORMAL HIGH (ref 70–99)

## 2014-07-10 LAB — PROTIME-INR
INR: 1.76 — AB (ref 0.00–1.49)
PROTHROMBIN TIME: 20.5 s — AB (ref 11.6–15.2)

## 2014-07-10 LAB — PRO B NATRIURETIC PEPTIDE: Pro B Natriuretic peptide (BNP): 588.8 pg/mL — ABNORMAL HIGH (ref 0–125)

## 2014-07-10 MED ORDER — WARFARIN SODIUM 7.5 MG PO TABS
7.5000 mg | ORAL_TABLET | Freq: Once | ORAL | Status: AC
  Administered 2014-07-10: 7.5 mg via ORAL
  Filled 2014-07-10: qty 1

## 2014-07-10 MED ORDER — POTASSIUM CHLORIDE CRYS ER 20 MEQ PO TBCR
20.0000 meq | EXTENDED_RELEASE_TABLET | Freq: Once | ORAL | Status: AC
  Administered 2014-07-10: 20 meq via ORAL
  Filled 2014-07-10: qty 1

## 2014-07-10 NOTE — Progress Notes (Signed)
Subjective:  Patient denies any chest pain states breathing is slowly improving. Also improving slowly.  Objective:  Vital Signs in the last 24 hours: Temp:  [98.1 F (36.7 C)] 98.1 F (36.7 C) (08/24 0517) Pulse Rate:  [85-95] 95 (08/24 0517) Resp:  [18-20] 20 (08/24 0517) BP: (107-140)/(49-84) 140/84 mmHg (08/24 0517) SpO2:  [92 %-95 %] 92 % (08/24 0517) Weight:  [96.798 kg (213 lb 6.4 oz)] 96.798 kg (213 lb 6.4 oz) (08/24 0517)  Intake/Output from previous day: 08/23 0701 - 08/24 0700 In: 703 [P.O.:700; I.V.:3] Out: 1475 [Urine:1475] Intake/Output from this shift: Total I/O In: 243 [P.O.:240; I.V.:3] Out: 600 [Urine:600]  Physical Exam: Neck: no adenopathy, no carotid bruit, no JVD and supple, symmetrical, trachea midline Lungs: Decreased breath sound at bases with faint rales air entry has improved Heart: irregularly irregular rhythm, S1, S2 normal and Soft systolic murmur noted Abdomen: soft, non-tender; bowel sounds normal; no masses,  no organomegaly Extremities: No clubbing cyanosis 1+ edema noted  Lab Results:  Recent Labs  07/08/14 0409  WBC 9.6  HGB 11.0*  PLT 307    Recent Labs  07/09/14 0345 07/10/14 0334  NA 142 139  K 3.8 3.6*  CL 97 95*  CO2 33* 32  GLUCOSE 163* 135*  BUN 31* 32*  CREATININE 0.93 0.95   No results found for this basename: TROPONINI, CK, MB,  in the last 72 hours Hepatic Function Panel No results found for this basename: PROT, ALBUMIN, AST, ALT, ALKPHOS, BILITOT, BILIDIR, IBILI,  in the last 72 hours No results found for this basename: CHOL,  in the last 72 hours No results found for this basename: PROTIME,  in the last 72 hours  Imaging: Imaging results have been reviewed and No results found.  Cardiac Studies:  Assessment/Plan:  Resolving decompensated acute systolic congestive heart failure  Coronary artery disease status post PCI to mid RCA in December 2014  Ischemic cardiomyopathy  Hypertension  Diabetes  mellitus  Hypercholesterolemia  Chronic atrial fibrillation  Hypercholesterolemia  Morbid obesity  Hypothyroidism  Resolving Mild cellulitis right leg  Plan Coumadin 7.5 mg one dose today Check labs in a.m. possible discharge tomorrow if stable  LOS: 5 days    Banner Huckaba N 07/10/2014, 12:45 PM

## 2014-07-11 LAB — GLUCOSE, CAPILLARY: Glucose-Capillary: 134 mg/dL — ABNORMAL HIGH (ref 70–99)

## 2014-07-11 LAB — PROTIME-INR
INR: 1.65 — AB (ref 0.00–1.49)
Prothrombin Time: 19.5 seconds — ABNORMAL HIGH (ref 11.6–15.2)

## 2014-07-11 MED ORDER — CARVEDILOL 6.25 MG PO TABS: 6.2500 mg | ORAL_TABLET | Freq: Two times a day (BID) | ORAL | Status: AC

## 2014-07-11 MED ORDER — FUROSEMIDE 40 MG PO TABS: 40.0000 mg | ORAL_TABLET | Freq: Two times a day (BID) | ORAL | Status: DC

## 2014-07-11 MED ORDER — INSULIN GLARGINE 100 UNIT/ML SOLOSTAR PEN: 15.0000 [IU] | PEN_INJECTOR | Freq: Two times a day (BID) | SUBCUTANEOUS | Status: DC

## 2014-07-11 MED ORDER — CEPHALEXIN 500 MG PO CAPS: 500.0000 mg | ORAL_CAPSULE | Freq: Three times a day (TID) | ORAL | Status: DC

## 2014-07-11 MED ORDER — CLOPIDOGREL BISULFATE 75 MG PO TABS: 75.0000 mg | ORAL_TABLET | Freq: Every day | ORAL | Status: AC

## 2014-07-11 NOTE — Progress Notes (Signed)
Discussed discharge instructions with pt including how and when to call the dr, follow up appt, medications, heart failure education, diet, and activity. Pt verbalized understanding and denied any questions. Prescriptions given.  Juliane Lack, RN

## 2014-07-11 NOTE — Progress Notes (Signed)
UR completed [place] K. Corliss Coggeshall, RN, BSN, MSHL, CCM  07/11/2014 2:32 PM

## 2014-07-11 NOTE — Discharge Instructions (Signed)

## 2014-07-11 NOTE — Discharge Summary (Signed)
  Discharge summary dictated on 835 2015 dictation number is [phone]

## 2014-07-11 NOTE — Discharge Summary (Signed)
[name], [name]               ACCOUNT NO.:  [id number]  MEDICAL RECORD NO.:  [id number]  LOCATION:  3E01C                        FACILITY:  [facility]  PHYSICIAN:  Eduardo Osier. Sharyn Lull, M.D. DATE OF BIRTH:  [birth year]  DATE OF ADMISSION:  07/05/2014 DATE OF DISCHARGE:  07/11/2014                              DISCHARGE SUMMARY   ADMITTING DIAGNOSES: 1. Acute left systolic congestive heart failure. 2. Hypertension. 3. Diabetes mellitus. 4. Chronic atrial fibrillation. 5. Hypercholesteremia. 6. Morbid obesity. 7. Hypothyroidism. 8. Coronary artery disease, status post PCI to RCA in December, 2014.  FINAL DIAGNOSES: 1. Compensated acute systolic congestive heart failure. 2. Coronary artery disease, status post PCI to mid RCA in December,     2014. 3  Ischemic cardiomyopathy. 1. Hypertension. 2. Diabetes mellitus. 3. Hypercholesteremia. 4. Chronic atrial fibrillation. 5. Morbid obesity. 6. Hypothyroidism. 7. Resolving mild cellulitis of the right leg.  DISCHARGE HOME MEDICATIONS: 1. Carvedilol 6.25 mg 1 tablet twice daily. 2. Keflex 500 mg 1 capsule every 8 hours for 5 more days. 3. Clopidogrel 75 mg 1 tablet daily. 4. Furosemide 40 mg 1 tablet twice daily. 5. Lantus insulin 15 units twice daily. 6. Xanax 0.5 mg at bedtime as needed. 7. Atorvastatin 20 mg daily. 8. Lexapro 20 mg daily. 9. Levothyroxine 75 mcg daily. 10.Multivitamin with mineral 1 tablet daily. 11.Ramipril 10 mg 1 capsule daily. 12.Aldactone 25 mg 1 tablet daily. 13.Warfarin 5 mg daily as before.  The patient has been advised to stop digoxin, diltiazem, glipizide, metformin, and metoprolol.  DIET:  Low salt low cholesterol 1800 calories ADA diet.  The patient has been advised to monitor blood pressure and blood sugar daily and adjust the insulin dose as instructed.  The patient also has been given heart failure instructions.  The patient has been advised to check PT/INR early next week.  CONDITION AT  DISCHARGE:  Stable.  FOLLOWUP:  With Dr. Algie Coffer in 2 weeks and PMD as scheduled.  The patient's INR is subtherapeutic today despite receiving extra dose of Coumadin yesterday. The patient is eager to go home and will follow up with PMD and Dr. Algie Coffer as outpatient.  CONDITION AT DISCHARGE:  Stable.  BRIEF HISTORY AND HOSPITAL COURSE:  [name] is 73 year old female with past medical history significant for multiple medical problems, i.e., coronary artery disease, status post PCI to mid RCA in December, 2014, ischemic cardiomyopathy, history of congestive heart failure secondary to depressed LV systolic function, chronic atrial fibrillation, hypertension, insulin-requiring diabetes mellitus, hypothyroidism, depression, morbid obesity, degenerative joint disease, history of hiatus hernia, hypercholesteremia was admitted by Dr. Algie Coffer because of progressive increasing shortness of breath associated with leg swelling for 2 days. The patient denies any fever or chills, body aches, chest pain, or URI symptoms.  Denies palpitation, lightheadedness, or syncope.  The patient does give history of PND, orthopnea, and leg swelling.  PAST MEDICAL HISTORY:  As above.  PHYSICAL EXAMINATION:  GENERAL:  She was alert, awake, oriented. VITAL SIGNS: Blood pressure was 90/75, pulse was 75. She was afebrile. Conjunctivae was pink. NECK:  Supple.  Positive JVD. LUNGS:  She has bilateral crackles.  No wheezing. CARDIOVASCULAR:  Irregularly irregular.  S1, S2 normal.  There was 2/6 systolic murmur and S3 gallop. ABDOMEN:  Soft.  Bowel sounds were present.  Nontender. EXTREMITIES:  There was 2+ edema, and she had about 0.5 inch diameter weeping ulcer with erythema in right lower leg. NEUROLOGIC:  Grossly intact.  LABORATORY DATA:  Her sodium was 143, potassium 4.8, BUN 35, creatinine 0.84.  Her proBNP was 1290.  Hemoglobin was 11.3, hematocrit 38.1, white count of 11.8.  Her last labs;  sodium 139, potassium 3.6, BUN 32, creatinine 0.95.  proBNP is trending down, yesterday was 588.  Her INR today is 1.65, PT is 19.5. Her blood sugar for last few days has been staying between 110-170.  The patient did not have further episodes of hypoglycemia after adjusting the insulin dose.  BRIEF HOSPITAL COURSE:  The patient was admitted to telemetry unit and was started on her home medications, p.o. Lasix was switched to IV Lasix with good diuresis.  Her calcium channel blockers were discontinued in view of depressed LV systolic function.  Metoprolol was switched to carvedilol.  The patient had multiple episodes of hypoglycemia requiring discontinuation of glipizide, metformin, and reduction in her Lantus insulin dose.  The patient was also started on Keflex for mild cellulitis and weeping ulcer of the right leg with improvement in the swelling and erythema.  The patient remained afebrile during the hospital stay.  The patient is ambulating in the room without any problems and eager to go home.  The patient will be discharged home on above medications and will be followed up by Dr. Algie Coffer in 2 weeks.  The patient will have blood work early next week and also follow up with PMD as scheduled.     Eduardo Osier. Sharyn Lull, M.D.     M[facility]/MEDQ  D:  07/11/2014  T:  07/11/2014  Job:  [id number]

## 2014-08-16 ENCOUNTER — Encounter: Payer: Self-pay | Admitting: Internal Medicine

## 2014-08-18 ENCOUNTER — Telehealth: Payer: Self-pay | Admitting: Internal Medicine

## 2014-08-18 NOTE — Telephone Encounter (Signed)
Since this a new acute problem, please consider OV at sat clinic, may need xray?

## 2014-08-18 NOTE — Telephone Encounter (Signed)
Patient informed of MD instructions.  She can't do appt. For sat. As she has plans.  Transferred to scheduler for appt. Next week

## 2014-08-18 NOTE — Telephone Encounter (Signed)
Dropped phone charge on left big toe.  She is requesting a referral to [facility] with Dr. Celene Skeenuckman

## 2014-08-21 ENCOUNTER — Encounter: Payer: Self-pay | Admitting: Internal Medicine

## 2014-08-21 ENCOUNTER — Ambulatory Visit (INDEPENDENT_AMBULATORY_CARE_PROVIDER_SITE_OTHER): Payer: Commercial Managed Care - HMO | Admitting: Internal Medicine

## 2014-08-21 VITALS — BP 110/62 | HR 96 | Temp 97.7°F | Resp 18 | Ht 63.0 in | Wt 212.4 lb

## 2014-08-21 DIAGNOSIS — S99922A Unspecified injury of left foot, initial encounter: Secondary | ICD-10-CM

## 2014-08-21 DIAGNOSIS — Z23 Encounter for immunization: Secondary | ICD-10-CM

## 2014-08-21 NOTE — Assessment & Plan Note (Signed)
She did drop a charger on her toe. Pain right around the toenail and no pain in the proximal joints of that toe (left great hallux). No signs of infection, advised to use soapy water to clean once per day and keep covered and in protective footwear. She does not have significant neuropathy. Referral placed to [facility] although informed patient that given her diabetes they will want her toe to start to heal before they would likely do anything with the toenail. No signs of infection, if she develops fevers, rash, more pain she is to call back to be seen again.

## 2014-08-21 NOTE — Progress Notes (Signed)
Pre visit review using our clinic review tool, if applicable. No additional management support is needed unless otherwise documented below in the visit note. 

## 2014-08-21 NOTE — Patient Instructions (Signed)
We will have you go see your foot doctor. Keep your wound covered. You can wash it gently with soapy water. Do not use alcohol on it as this can dry out the skin and encourage skin breakdown.   Diabetes and Foot Care Diabetes may cause you to have problems because of poor blood supply (circulation) to your feet and legs. This may cause the skin on your feet to become thinner, break easier, and heal more slowly. Your skin may become dry, and the skin may peel and crack. You may also have nerve damage in your legs and feet causing decreased feeling in them. You may not notice minor injuries to your feet that could lead to infections or more serious problems. Taking care of your feet is one of the most important things you can do for yourself.  HOME CARE INSTRUCTIONS  Wear shoes at all times, even in the house. Do not go barefoot. Bare feet are easily injured.  Check your feet daily for blisters, cuts, and redness. If you cannot see the bottom of your feet, use a mirror or ask someone for help.  Wash your feet with warm water (do not use hot water) and mild soap. Then pat your feet and the areas between your toes until they are completely dry. Do not soak your feet as this can dry your skin.  Apply a moisturizing lotion or petroleum jelly (that does not contain alcohol and is unscented) to the skin on your feet and to dry, brittle toenails. Do not apply lotion between your toes.  Trim your toenails straight across. Do not dig under them or around the cuticle. File the edges of your nails with an emery board or nail file.  Do not cut corns or calluses or try to remove them with medicine.  Wear clean socks or stockings every day. Make sure they are not too tight. Do not wear knee-high stockings since they may decrease blood flow to your legs.  Wear shoes that fit properly and have enough cushioning. To break in new shoes, wear them for just a few hours a day. This prevents you from injuring your feet.  Always look in your shoes before you put them on to be sure there are no objects inside.  Do not cross your legs. This may decrease the blood flow to your feet.  If you find a minor scrape, cut, or break in the skin on your feet, keep it and the skin around it clean and dry. These areas may be cleansed with mild soap and water. Do not cleanse the area with peroxide, alcohol, or iodine.  When you remove an adhesive bandage, be sure not to damage the skin around it.  If you have a wound, look at it several times a day to make sure it is healing.  Do not use heating pads or hot water bottles. They may burn your skin. If you have lost feeling in your feet or legs, you may not know it is happening until it is too late.  Make sure your health care provider performs a complete foot exam at least annually or more often if you have foot problems. Report any cuts, sores, or bruises to your health care provider immediately. SEEK MEDICAL CARE IF:   You have an injury that is not healing.  You have cuts or breaks in the skin.  You have an ingrown nail.  You notice redness on your legs or feet.  You feel burning or tingling  in your legs or feet.  You have pain or cramps in your legs and feet.  Your legs or feet are numb.  Your feet always feel cold. SEEK IMMEDIATE MEDICAL CARE IF:   There is increasing redness, swelling, or pain in or around a wound.  There is a red line that goes up your leg.  Pus is coming from a wound.  You develop a fever or as directed by your health care provider.  You notice a bad smell coming from an ulcer or wound. Document Released: 10/31/2000 Document Revised: 07/06/2013 Document Reviewed: 04/12/2013 [facility] Patient Information 2015 [place], [place]. This information is not intended to replace advice given to you by your health care provider. Make sure you discuss any questions you have with your health care provider.

## 2014-08-21 NOTE — Progress Notes (Signed)
   Subjective:    Patient ID: Tracey [name], female    DOB: 12/08/1940, 73 y.o.   MRN: [id number]  HPI The patient is a 73 YO female who is coming in for an acute visit. She dropped a Consulting civil engineercharger for a drill on her toe and is having some pain. Allergies, medications, PMH reviewed in chart. She does have PMH of DM and takes lantus, metformin, glipizide. No hypo/hyperglycemia. She is able to walk on the foot and bend it but thinks it still has been oozing some blood. She would like to go to a foot doctor as it is very near the nail and she is concerned they may need to remove the toenail.   Review of Systems  Constitutional: Positive for activity change. Negative for fever, appetite change and fatigue.  Respiratory: Negative for chest tightness and shortness of breath.   Cardiovascular: Negative for chest pain, palpitations and leg swelling.  Musculoskeletal: Positive for arthralgias and myalgias. Negative for gait problem.  Skin: Positive for color change and wound.      Objective:   Physical Exam  Constitutional: She appears well-developed and well-nourished. No distress.  Cardiovascular: Normal rate and regular rhythm.   Pulmonary/Chest: Effort normal and breath sounds normal. No respiratory distress. She has no wheezes. She has no rales.  Abdominal: Soft. Bowel sounds are normal.  Neurological: Coordination normal.  Skin: Skin is warm and dry. There is erythema.  Left great toe with hematoma at the base of the nail with some crusting of blood in the area. Not oozing blood at this time. Nail not broken. No rash. No pain in the foot or proximal metatarsal joints.       Assessment & Plan:

## 2014-08-28 ENCOUNTER — Encounter: Payer: Self-pay | Admitting: Podiatry

## 2014-08-28 ENCOUNTER — Ambulatory Visit (INDEPENDENT_AMBULATORY_CARE_PROVIDER_SITE_OTHER): Payer: Commercial Managed Care - HMO | Admitting: Podiatry

## 2014-08-28 VITALS — BP 132/75 | HR 75 | Temp 97.0°F | Resp 16 | Ht 63.0 in | Wt 212.0 lb

## 2014-08-28 DIAGNOSIS — S90221A Contusion of right lesser toe(s) with damage to nail, initial encounter: Secondary | ICD-10-CM

## 2014-08-28 DIAGNOSIS — S90122A Contusion of left lesser toe(s) without damage to nail, initial encounter: Secondary | ICD-10-CM

## 2014-08-28 MED ORDER — AMOXICILLIN-POT CLAVULANATE 875-125 MG PO TABS: 1.0000 | ORAL_TABLET | Freq: Two times a day (BID) | ORAL | Status: DC

## 2014-08-28 NOTE — Patient Instructions (Addendum)

## 2014-08-28 NOTE — Progress Notes (Signed)
   Subjective:    Patient ID: Tracey [name], female    DOB: 03/26/1941, 73 y.o.   MRN: [id number]  HPI Comments: N toe injury L left 1st toe D 2 weeks ago O dropped the drill charger  C pain, swelling and bleeding at the left 1st toenail and possibly the toenail is loose A diabetic pt, enclosed shoes and Coumadin and Plavix  T pt states only cleansed with soapy water, Dr. Fayrene [facility]earingJames John[facility] office referred here.     Review of Systems  Neurological: Positive for numbness.  Hematological: Bruises/bleeds easily.  All other systems reviewed and are negative.      Objective:   Physical Exam  Orientated x3  Vascular: DP pulses 2/4 bilaterally PT pulses 1/4 bilaterally  Neurological: Sensation to 10 g monofilament wire intact 4/5 right and 3/5 left Vibratory sensation diminished bilaterally Ankle reflex equal and reactive bilaterally  Dermatological: Mild nonpitting edema dorsal feet bilaterally The left hallux has bloody serous drainage from the posterior nail fold area with partial mobility and low-grade erythema noted  Musculoskeletal: No deformities noted bilaterally Pes planus bilaterally         Assessment & Plan:  Assessment: Diminished pedal pulses bilaterally suggesting possible peripheral arterial disease Diabetic peripheral neuropathy Subungual hematoma left hallux  Plan: The left hallux was prepped with Betadine The left hallux nail plate was grasped and the minimal posterior attachments were released sharply without any tearing of the underlying nailbed. An antibiotic dressing was applied  Betadine soaks prescribed for the left hallux daily with application of antibiotic ointment to the left hallux nail bed Augmentin 875/125 twice a day x10 days   Patient advised to wear an open toe shoe or slipper Patient advised if she develops any sudden swelling, redness, fever, warmth ,  in the left hallux or lower leg ,present to the ER  Reappoint x2 weeks or  sooner if the patient has concern

## 2014-08-29 ENCOUNTER — Encounter: Payer: Self-pay | Admitting: Podiatry

## 2014-09-01 ENCOUNTER — Encounter: Payer: Self-pay | Admitting: Gastroenterology

## 2014-09-07 ENCOUNTER — Encounter (INDEPENDENT_AMBULATORY_CARE_PROVIDER_SITE_OTHER): Payer: Self-pay

## 2014-09-07 ENCOUNTER — Ambulatory Visit
Admission: RE | Admit: 2014-09-07 | Discharge: 2014-09-07 | Disposition: A | Discharge status: Home / Self Care | Payer: Commercial Managed Care - HMO | Source: Ambulatory Visit | Attending: Cardiovascular Disease | Admitting: Cardiovascular Disease

## 2014-09-07 ENCOUNTER — Other Ambulatory Visit: Payer: Self-pay | Admitting: Cardiovascular Disease

## 2014-09-07 DIAGNOSIS — R531 Weakness: Secondary | ICD-10-CM

## 2014-09-11 ENCOUNTER — Ambulatory Visit: Payer: Commercial Managed Care - HMO | Admitting: Podiatry

## 2014-09-19 ENCOUNTER — Other Ambulatory Visit: Payer: Self-pay

## 2014-09-19 MED ORDER — GLIPIZIDE 10 MG PO TABS: 10.0000 mg | ORAL_TABLET | Freq: Two times a day (BID) | ORAL | Status: DC

## 2014-09-27 ENCOUNTER — Ambulatory Visit: Payer: Commercial Managed Care - HMO | Admitting: Podiatry

## 2014-10-03 ENCOUNTER — Ambulatory Visit (INDEPENDENT_AMBULATORY_CARE_PROVIDER_SITE_OTHER): Payer: Commercial Managed Care - HMO | Admitting: Internal Medicine

## 2014-10-03 ENCOUNTER — Encounter: Payer: Self-pay | Admitting: Internal Medicine

## 2014-10-03 ENCOUNTER — Other Ambulatory Visit (INDEPENDENT_AMBULATORY_CARE_PROVIDER_SITE_OTHER): Payer: Commercial Managed Care - HMO

## 2014-10-03 VITALS — BP 132/76 | HR 70 | Temp 97.7°F | Ht 63.0 in | Wt 215.0 lb

## 2014-10-03 DIAGNOSIS — E119 Type 2 diabetes mellitus without complications: Secondary | ICD-10-CM

## 2014-10-03 DIAGNOSIS — E7800 Pure hypercholesterolemia, unspecified: Secondary | ICD-10-CM

## 2014-10-03 DIAGNOSIS — Z0189 Encounter for other specified special examinations: Secondary | ICD-10-CM

## 2014-10-03 DIAGNOSIS — I10 Essential (primary) hypertension: Secondary | ICD-10-CM

## 2014-10-03 DIAGNOSIS — Z0000 Encounter for general adult medical examination without abnormal findings: Secondary | ICD-10-CM

## 2014-10-03 LAB — HEMOGLOBIN A1C: Hgb A1c MFr Bld: 7.2 % — ABNORMAL HIGH (ref 4.6–6.5)

## 2014-10-03 NOTE — Progress Notes (Signed)
Subjective:    Patient ID: Tracey [name], female    DOB: 12/30/1940, 73 y.o.   MRN: [id number]  HPI  Here to f/u; overall doing ok,  Pt denies chest pain, increased sob or doe, wheezing, orthopnea, PND, increased LE swelling, palpitations, dizziness or syncope.  Pt denies polydipsia, polyuria, or low sugar symptoms such as weakness or confusion improved with po intake.  Pt denies new neurological symptoms such as new headache, or facial or extremity weakness or numbness.   Pt states overall good compliance with meds, has been trying to follow lower cholesterol, diabetic diet, with wt overall stable,  but little exercise however. Hospd aug 2015 with chf exac, doing very well since then, states today feels better than she has in a couple of yrs.   No current complaints Did see podiatry for toenail tx. Pt continues to have recurring LBP without change in severity, bowel or bladder change, fever, wt loss,  worsening LE pain/numbness/weakness, gait change or falls. Past Medical History  Diagnosis Date  . CHF (congestive heart failure)   . Atrial fibrillation   . Diabetes mellitus without complication   . Hypertension   . Thyroid disease   . Coronary artery disease   . Hypothyroidism   . Anxiety   . Shortness of breath     WITH MY CHF  . Arthritis     RT KNEE  . Anemia   . ACS (acute coronary syndrome) 10/27/2013  . Diverticulosis 10/22/2005    Qualifier: Diagnosis of  By: Koleen DistanceKowalk CMA (AAMA), Hulan SaasLeisha    . HIATAL HERNIA 10/22/2005    Qualifier: Diagnosis of  By: Koleen DistanceKowalk CMA (AAMA), Hulan SaasLeisha    . HYPERCHOLESTEROLEMIA 03/09/2008    Qualifier: Diagnosis of  By: Koleen DistanceKowalk CMA Duncan Dull(AAMA), Hulan SaasLeisha     Past Surgical History  Procedure Laterality Date  . Abdominal hysterectomy    . Cardiac catheterization  10/27/2013  . Coronary angioplasty with stent placement  10/27/2013    DES   TO RCA       DR HARWANI  . Cholecystectomy    . Eye surgery    . Cataract extraction Bilateral 2014    reports that she has  quit smoking. She has never used smokeless tobacco. She reports that she does not drink alcohol or use illicit drugs. family history includes Heart disease in her father. Allergies  Allergen Reactions  . Codeine Nausea And Vomiting   Current Outpatient Prescriptions on File Prior to Visit  Medication Sig Dispense Refill  . ALPRAZolam (XANAX) 0.5 MG tablet Take 0.5 mg by mouth at bedtime as needed for sleep.    Marland Kitchen. amoxicillin-clavulanate (AUGMENTIN) 875-125 MG per tablet Take 1 tablet by mouth 2 (two) times daily. 20 tablet 0  . atorvastatin (LIPITOR) 20 MG tablet Take 1 tablet (20 mg total) by mouth daily. 90 tablet 3  . carvedilol (COREG) 6.25 MG tablet Take 1 tablet (6.25 mg total) by mouth 2 (two) times daily with a meal. 60 tablet 3  . clopidogrel (PLAVIX) 75 MG tablet Take 1 tablet (75 mg total) by mouth daily. 30 tablet 3  . diltiazem (CARDIZEM) 30 MG tablet Take 30 mg by mouth 2 (two) times daily.     Marland Kitchen. escitalopram (LEXAPRO) 20 MG tablet Take 20 mg by mouth daily.    . furosemide (LASIX) 40 MG tablet Take 1 tablet (40 mg total) by mouth 2 (two) times daily. 60 tablet 3  . glipiZIDE (GLUCOTROL) 10 MG tablet Take 1 tablet (10  mg total) by mouth 2 (two) times daily before a meal. 60 tablet 11  . Insulin Glargine (LANTUS SOLOSTAR) 100 UNIT/ML Solostar Pen Inject 15 Units into the skin 2 (two) times daily. 15 mL 11  . levothyroxine (SYNTHROID, LEVOTHROID) 75 MCG tablet Take 1 tablet (75 mcg total) by mouth daily before breakfast. 30 tablet 3  . metFORMIN (GLUCOPHAGE) 500 MG tablet Take 500 mg by mouth 2 (two) times daily with a meal.    . metoprolol tartrate (LOPRESSOR) 25 MG tablet Take 25 mg by mouth daily.     . Multiple Vitamin (MULTIVITAMIN WITH MINERALS) TABS tablet Take 1 tablet by mouth daily.    . ramipril (ALTACE) 10 MG capsule Take 1 capsule (10 mg total) by mouth daily. 30 capsule 3  . spironolactone (ALDACTONE) 25 MG tablet Take 1 tablet (25 mg total) by mouth daily. 30 tablet 3   . warfarin (COUMADIN) 5 MG tablet Take 5 mg by mouth every evening.     No current facility-administered medications on file prior to visit.   Review of Systems  Constitutional: Negative for unusual diaphoresis or other sweats  HENT: Negative for ringing in ear Eyes: Negative for double vision or worsening visual disturbance.  Respiratory: Negative for choking and stridor.   Gastrointestinal: Negative for vomiting or other signifcant bowel change Genitourinary: Negative for hematuria or decreased urine volume.  Musculoskeletal: Negative for other MSK pain or swelling Skin: Negative for color change and worsening wound.  Neurological: Negative for tremors and numbness other than noted  Psychiatric/Behavioral: Negative for decreased concentration or agitation other than above       Objective:   Physical Exam BP 132/76 mmHg  Pulse 70  Temp(Src) 97.7 F (36.5 C) (Oral)  Ht 5\' 3"  (1.6 m)  Wt 215 lb (97.523 kg)  BMI 38.09 kg/m2  SpO2 92% VS noted,  Constitutional: Pt appears well-developed, well-nourished.  HENT: Head: NCAT.  Right Ear: External ear normal.  Left Ear: External ear normal.  Eyes: . Pupils are equal, round, and reactive to light. Conjunctivae and EOM are normal Neck: Normal range of motion. Neck supple.  Cardiovascular: Normal rate and regular rhythm.   Pulmonary/Chest: Effort normal and breath sounds normal.  Abd:  Soft, NT, ND, + BS Neurological: Pt is alert. Not confused , motor grossly intact Skin: Skin is warm. No rash Psychiatric: Pt behavior is normal. No agitation.     Assessment & Plan:

## 2014-10-03 NOTE — Patient Instructions (Signed)

## 2014-10-03 NOTE — Assessment & Plan Note (Signed)
stable overall by history and exam, recent data reviewed with pt, and pt to continue medical treatment as before,  to f/u any worsening symptoms or concerns Lab Results  Component Value Date   HGBA1C 6.5* 07/06/2014

## 2014-10-03 NOTE — Assessment & Plan Note (Signed)
stable overall by history and exam, recent data reviewed with pt, and pt to continue medical treatment as before,  to f/u any worsening symptoms or concerns Lab Results  Component Value Date   LDLCALC 151* 04/07/2014   To f/u lab now on lipitor 20

## 2014-10-03 NOTE — Progress Notes (Signed)
Pre visit review using our clinic review tool, if applicable. No additional management support is needed unless otherwise documented below in the visit note. 

## 2014-10-03 NOTE — Assessment & Plan Note (Signed)
stable overall by history and exam, recent data reviewed with pt, and pt to continue medical treatment as before,  to f/u any worsening symptoms or concerns BP Readings from Last 3 Encounters:  10/03/14 132/76  08/28/14 132/75  08/21/14 110/62

## 2014-10-04 ENCOUNTER — Encounter: Payer: Self-pay | Admitting: Internal Medicine

## 2014-10-04 ENCOUNTER — Other Ambulatory Visit: Payer: Self-pay | Admitting: Internal Medicine

## 2014-10-04 LAB — HEPATIC FUNCTION PANEL
ALBUMIN: 3.5 g/dL (ref 3.5–5.2)
ALT: 18 U/L (ref 0–35)
AST: 16 U/L (ref 0–37)
Alkaline Phosphatase: 73 U/L (ref 39–117)
Bilirubin, Direct: 0.1 mg/dL (ref 0.0–0.3)
Total Bilirubin: 0.5 mg/dL (ref 0.2–1.2)
Total Protein: 7.7 g/dL (ref 6.0–8.3)

## 2014-10-04 LAB — BASIC METABOLIC PANEL
BUN: 22 mg/dL (ref 6–23)
CHLORIDE: 99 meq/L (ref 96–112)
CO2: 34 meq/L — AB (ref 19–32)
Calcium: 9 mg/dL (ref 8.4–10.5)
Creatinine, Ser: 0.9 mg/dL (ref 0.4–1.2)
GFR: 61.89 mL/min (ref >60.00)
GLUCOSE: 110 mg/dL — AB (ref 70–99)
POTASSIUM: 4.3 meq/L (ref 3.5–5.1)
Sodium: 140 mEq/L (ref 135–145)

## 2014-10-04 LAB — LIPID PANEL
CHOL/HDL RATIO: 7
Cholesterol: 238 mg/dL — ABNORMAL HIGH (ref 0–200)
HDL: 35.4 mg/dL — AB (ref >39.00)
LDL Cholesterol: 165 mg/dL — ABNORMAL HIGH (ref 0–99)
NONHDL: 202.6
Triglycerides: 190 mg/dL — ABNORMAL HIGH (ref 0.0–149.0)
VLDL: 38 mg/dL (ref 0.0–40.0)

## 2014-10-04 MED ORDER — ATORVASTATIN CALCIUM 20 MG PO TABS: 20.0000 mg | ORAL_TABLET | Freq: Every day | ORAL | Status: DC

## 2014-10-10 ENCOUNTER — Ambulatory Visit: Payer: Commercial Managed Care - HMO | Admitting: Internal Medicine

## 2014-10-26 ENCOUNTER — Encounter (HOSPITAL_COMMUNITY): Payer: Self-pay | Admitting: Cardiovascular Disease

## 2014-11-21 ENCOUNTER — Other Ambulatory Visit: Payer: Self-pay | Admitting: Internal Medicine

## 2014-11-21 NOTE — Telephone Encounter (Signed)
Done hardcopy to robin  

## 2014-11-21 NOTE — Telephone Encounter (Signed)
Faxed hardcopy for alprazol[facility] to [facility] [address] 

## 2014-12-19 ENCOUNTER — Other Ambulatory Visit: Payer: Self-pay | Admitting: Cardiovascular Disease

## 2014-12-19 ENCOUNTER — Ambulatory Visit
Admission: RE | Admit: 2014-12-19 | Discharge: 2014-12-19 | Disposition: A | Discharge status: Home / Self Care | Payer: Commercial Managed Care - HMO | Source: Ambulatory Visit | Attending: Cardiovascular Disease | Admitting: Cardiovascular Disease

## 2014-12-19 DIAGNOSIS — R05 Cough: Secondary | ICD-10-CM

## 2014-12-19 DIAGNOSIS — R059 Cough, unspecified: Secondary | ICD-10-CM

## 2015-01-12 ENCOUNTER — Telehealth: Payer: Self-pay | Admitting: Internal Medicine

## 2015-01-12 NOTE — Telephone Encounter (Signed)
Requesting [employer] referral for patient.  Appointment scheduled for 01/16/2015 at 3:30pm.

## 2015-01-16 NOTE — Telephone Encounter (Signed)
Patient's current referral Berkley Harvey(auth # [id number]) is valid through 03/04/15. I spoke w/Leslie and she is aware. Submitted new referral starting 03/05/15 and valid through 09/01/15 auth # [id number].

## 2015-02-27 ENCOUNTER — Other Ambulatory Visit: Payer: Self-pay | Admitting: Internal Medicine

## 2015-02-27 NOTE — Telephone Encounter (Signed)
Done hardcopy to Cherina  

## 2015-02-28 NOTE — Telephone Encounter (Signed)
Rx sent to pharmacy   

## 2015-03-30 ENCOUNTER — Other Ambulatory Visit (INDEPENDENT_AMBULATORY_CARE_PROVIDER_SITE_OTHER): Payer: Commercial Managed Care - HMO

## 2015-03-30 DIAGNOSIS — Z0189 Encounter for other specified special examinations: Secondary | ICD-10-CM | POA: Diagnosis not present

## 2015-03-30 DIAGNOSIS — E119 Type 2 diabetes mellitus without complications: Secondary | ICD-10-CM

## 2015-03-30 DIAGNOSIS — Z0000 Encounter for general adult medical examination without abnormal findings: Secondary | ICD-10-CM

## 2015-03-30 LAB — CBC WITH DIFFERENTIAL/PLATELET
Basophils Absolute: 0.1 10*3/uL (ref 0.0–0.1)
Basophils Relative: 0.5 % (ref 0.0–3.0)
EOS PCT: 5 % (ref 0.0–5.0)
Eosinophils Absolute: 0.5 10*3/uL (ref 0.0–0.7)
HCT: 38.4 % (ref 36.0–46.0)
HEMOGLOBIN: 12.6 g/dL (ref 12.0–15.0)
Lymphocytes Relative: 19.4 % (ref 12.0–46.0)
Lymphs Abs: 1.9 10*3/uL (ref 0.7–4.0)
MCHC: 32.9 g/dL (ref 30.0–36.0)
MCV: 85 fl (ref 78.0–100.0)
Monocytes Absolute: 1 10*3/uL (ref 0.1–1.0)
Monocytes Relative: 9.8 % (ref 3.0–12.0)
NEUTROS PCT: 65.3 % (ref 43.0–77.0)
Neutro Abs: 6.4 10*3/uL (ref 1.4–7.7)
PLATELETS: 313 10*3/uL (ref 150.0–400.0)
RBC: 4.51 Mil/uL (ref 3.87–5.11)
RDW: 15.4 % (ref 11.5–15.5)
WBC: 9.8 10*3/uL (ref 4.0–10.5)

## 2015-03-30 LAB — HEPATIC FUNCTION PANEL
ALT: 11 U/L (ref 0–35)
AST: 13 U/L (ref 0–37)
Albumin: 3.6 g/dL (ref 3.5–5.2)
Alkaline Phosphatase: 76 U/L (ref 39–117)
BILIRUBIN TOTAL: 0.4 mg/dL (ref 0.2–1.2)
Bilirubin, Direct: 0.1 mg/dL (ref 0.0–0.3)
TOTAL PROTEIN: 7.8 g/dL (ref 6.0–8.3)

## 2015-03-30 LAB — LIPID PANEL
CHOL/HDL RATIO: 6
Cholesterol: 237 mg/dL — ABNORMAL HIGH (ref 0–200)
HDL: 39.6 mg/dL (ref >39.00)
LDL Cholesterol: 175 mg/dL — ABNORMAL HIGH (ref 0–99)
NONHDL: 197.4
Triglycerides: 111 mg/dL (ref 0.0–149.0)
VLDL: 22.2 mg/dL (ref 0.0–40.0)

## 2015-03-30 LAB — BASIC METABOLIC PANEL
BUN: 19 mg/dL (ref 6–23)
CO2: 37 mEq/L — ABNORMAL HIGH (ref 19–32)
CREATININE: 0.86 mg/dL (ref 0.40–1.20)
Calcium: 9 mg/dL (ref 8.4–10.5)
Chloride: 96 mEq/L (ref 96–112)
GFR: 68.49 mL/min (ref >60.00)
GLUCOSE: 72 mg/dL (ref 70–99)
POTASSIUM: 3.7 meq/L (ref 3.5–5.1)
Sodium: 139 mEq/L (ref 135–145)

## 2015-03-30 LAB — TSH: TSH: 1.51 u[IU]/mL (ref 0.35–4.50)

## 2015-03-30 LAB — HEMOGLOBIN A1C: HEMOGLOBIN A1C: 6.6 % — AB (ref 4.6–6.5)

## 2015-04-03 ENCOUNTER — Encounter: Payer: Self-pay | Admitting: Internal Medicine

## 2015-04-03 ENCOUNTER — Ambulatory Visit (INDEPENDENT_AMBULATORY_CARE_PROVIDER_SITE_OTHER): Payer: Commercial Managed Care - HMO | Admitting: Internal Medicine

## 2015-04-03 VITALS — BP 120/86 | HR 72 | Temp 97.6°F | Wt 219.6 lb

## 2015-04-03 DIAGNOSIS — M25561 Pain in right knee: Secondary | ICD-10-CM | POA: Insufficient documentation

## 2015-04-03 DIAGNOSIS — E7800 Pure hypercholesterolemia, unspecified: Secondary | ICD-10-CM

## 2015-04-03 DIAGNOSIS — Z0000 Encounter for general adult medical examination without abnormal findings: Secondary | ICD-10-CM

## 2015-04-03 DIAGNOSIS — M545 Low back pain, unspecified: Secondary | ICD-10-CM | POA: Insufficient documentation

## 2015-04-03 DIAGNOSIS — E119 Type 2 diabetes mellitus without complications: Secondary | ICD-10-CM

## 2015-04-03 DIAGNOSIS — Z23 Encounter for immunization: Secondary | ICD-10-CM | POA: Diagnosis not present

## 2015-04-03 MED ORDER — LIDOCAINE 5 % EX PTCH: 1.0000 | MEDICATED_PATCH | CUTANEOUS | Status: DC

## 2015-04-03 MED ORDER — LEVOTHYROXINE SODIUM 75 MCG PO TABS: 75.0000 ug | ORAL_TABLET | Freq: Every day | ORAL | Status: DC

## 2015-04-03 MED ORDER — RAMIPRIL 10 MG PO CAPS: 10.0000 mg | ORAL_CAPSULE | Freq: Every day | ORAL | Status: DC

## 2015-04-03 MED ORDER — LOVASTATIN 40 MG PO TABS: 40.0000 mg | ORAL_TABLET | ORAL | Status: DC

## 2015-04-03 MED ORDER — DICLOFENAC SODIUM 1 % TD GEL: 4.0000 g | Freq: Four times a day (QID) | TRANSDERMAL | Status: DC

## 2015-04-03 MED ORDER — ESCITALOPRAM OXALATE 20 MG PO TABS: 20.0000 mg | ORAL_TABLET | Freq: Every day | ORAL | Status: AC

## 2015-04-03 MED ORDER — RAMIPRIL 10 MG PO CAPS: 10.0000 mg | ORAL_CAPSULE | Freq: Every day | ORAL | Status: AC

## 2015-04-03 NOTE — Progress Notes (Signed)
Pre visit review using our clinic review tool, if applicable. No additional management support is needed unless otherwise documented below in the visit note. 

## 2015-04-03 NOTE — Assessment & Plan Note (Signed)

## 2015-04-03 NOTE — Assessment & Plan Note (Signed)
Pt is willing to re-try lovastatin at 40 mg qod,  to f/u any worsening symptoms or concerns

## 2015-04-03 NOTE — Assessment & Plan Note (Signed)
Likely realated to DJD - for voltaren gel prn

## 2015-04-03 NOTE — Assessment & Plan Note (Signed)
stable overall by history and exam, recent data reviewed with pt, and pt to continue medical treatment as before,  to f/u any worsening symptoms or concerns Lab Results  Component Value Date   HGBA1C 6.6* 03/30/2015    

## 2015-04-03 NOTE — Patient Instructions (Addendum)
You had the Td (tetanus) and Pneumovax pneumonia shots today  Please take all new medication as prescribed - the voltaren gel for the knee, and the lidoderm patch for the back  Please continue all other medications as before, and refills have been done if requested.  Please have the pharmacy call with any other refills you may need.  Please continue your efforts at being more active, low cholesterol diet, and weight control.  You are otherwise up to date with prevention measures today.  Please keep your appointments with your specialists as you may have planned  Please return in 6 months, or sooner if needed, with Lab testing done 3-5 days before

## 2015-04-03 NOTE — Addendum Note (Signed)
Addended by: Anselm JunglingBONYUN-DEBOURGH, Virginio Isidore M on: 04/03/2015 03:43 PM   Modules accepted: Orders

## 2015-04-03 NOTE — Progress Notes (Signed)
Subjective:    Patient ID: Tracey [name], female    DOB: 11/17/1940, 74 y.o.   MRN: [id number]  HPI  Here for wellness and f/u;  Overall doing ok;  Pt denies Chest pain, worsening SOB, DOE, wheezing, orthopnea, PND, worsening LE edema, palpitations, dizziness or syncope.  Pt denies neurological change such as new headache, facial or extremity weakness.  Pt denies polydipsia, polyuria, or low sugar symptoms. Pt states overall good compliance with treatment and medications, good tolerability, and has been trying to follow appropriate diet.  Pt denies worsening depressive symptoms, suicidal ideation or panic. No fever, night sweats, wt loss, loss of appetite, or other constitutional symptoms.  Pt states good ability with ADL's, has low fall risk, home safety reviewed and adequate, no other significant changes in hearing or vision, and only occasionally active with exercise.  No current compalints.  Cannot tolerate the liptior. Pt continues to have recurring LBP without change in severity, bowel or bladder change, fever, wt loss,  worsening LE pain/numbness/weakness, gait change or falls. Past Medical History  Diagnosis Date  . CHF (congestive heart failure)   . Atrial fibrillation   . Diabetes mellitus without complication   . Hypertension   . Thyroid disease   . Coronary artery disease   . Hypothyroidism   . Anxiety   . Shortness of breath     WITH MY CHF  . Arthritis     RT KNEE  . Anemia   . ACS (acute coronary syndrome) 10/27/2013  . Diverticulosis 10/22/2005    Qualifier: Diagnosis of  By: Koleen DistanceKowalk CMA (AAMA), Hulan SaasLeisha    . HIATAL HERNIA 10/22/2005    Qualifier: Diagnosis of  By: Koleen DistanceKowalk CMA (AAMA), Hulan SaasLeisha    . HYPERCHOLESTEROLEMIA 03/09/2008    Qualifier: Diagnosis of  By: Koleen DistanceKowalk CMA Duncan Dull(AAMA), Hulan SaasLeisha     Past Surgical History  Procedure Laterality Date  . Abdominal hysterectomy    . Cardiac catheterization  10/27/2013  . Coronary angioplasty with stent placement  10/27/2013    DES   TO  RCA       DR HARWANI  . Cholecystectomy    . Eye surgery    . Cataract extraction Bilateral 2014  . Left and right heart catheterization with coronary angiogram N/A 09/20/2013    Procedure: LEFT AND RIGHT HEART CATHETERIZATION WITH CORONARY ANGIOGRAM;  Surgeon: Ricki RodriguezAjay S Kadakia, MD;  Location: [facility] CATH LAB;  Service: Cardiovascular;  Laterality: N/A;  . Percutaneous coronary stent intervention (pci-s) N/A 09/22/2013    Procedure: PERCUTANEOUS CORONARY STENT INTERVENTION (PCI-S);  Surgeon: Robynn PaneMohan N Harwani, MD;  [facility];  Service: Cardiovascular;  Laterality: N/A;  . Percutaneous coronary stent intervention (pci-s) N/A 10/27/2013    Procedure: PERCUTANEOUS CORONARY STENT INTERVENTION (PCI-S);  Surgeon: Robynn PaneMohan N Harwani, MD;  [facility];  Service: Cardiovascular;  Laterality: N/A;    reports that she has quit smoking. She has never used smokeless tobacco. She reports that she does not drink alcohol or use illicit drugs. family history includes Heart disease in her father. Allergies  Allergen Reactions  . Codeine Nausea And Vomiting   Current Outpatient Prescriptions on File Prior to Visit  Medication Sig Dispense Refill  . ALPRAZolam (XANAX) 0.5 MG tablet TAKE 1 TABLET BY MOUTH TWICE A DAY AS NEEDED 60 tablet 1  . atorvastatin (LIPITOR) 20 MG tablet Take 1 tablet (20 mg total) by mouth daily. 90 tablet 3  . carvedilol (COREG) 6.25 MG tablet Take 1 tablet (6.25  mg total) by mouth 2 (two) times daily with a meal. 60 tablet 3  . clopidogrel (PLAVIX) 75 MG tablet Take 1 tablet (75 mg total) by mouth daily. 30 tablet 3  . diltiazem (CARDIZEM) 30 MG tablet Take 30 mg by mouth 2 (two) times daily.     Marland Kitchen. escitalopram (LEXAPRO) 20 MG tablet Take 20 mg by mouth daily.    . furosemide (LASIX) 40 MG tablet Take 1 tablet (40 mg total) by mouth 2 (two) times daily. 60 tablet 3  . glipiZIDE (GLUCOTROL) 10 MG tablet Take 1 tablet (10 mg total) by mouth 2 (two) times daily before a meal. 60  tablet 11  . Insulin Glargine (LANTUS SOLOSTAR) 100 UNIT/ML Solostar Pen Inject 15 Units into the skin 2 (two) times daily. 15 mL 11  . levothyroxine (SYNTHROID, LEVOTHROID) 75 MCG tablet Take 1 tablet (75 mcg total) by mouth daily before breakfast. 30 tablet 3  . metFORMIN (GLUCOPHAGE) 500 MG tablet Take 500 mg by mouth 2 (two) times daily with a meal.    . metoprolol tartrate (LOPRESSOR) 25 MG tablet Take 25 mg by mouth daily.     . Multiple Vitamin (MULTIVITAMIN WITH MINERALS) TABS tablet Take 1 tablet by mouth daily.    . ramipril (ALTACE) 10 MG capsule Take 1 capsule (10 mg total) by mouth daily. 30 capsule 3  . spironolactone (ALDACTONE) 25 MG tablet Take 1 tablet (25 mg total) by mouth daily. 30 tablet 3  . warfarin (COUMADIN) 5 MG tablet Take 5 mg by mouth every evening.    Marland Kitchen. amoxicillin-clavulanate (AUGMENTIN) 875-125 MG per tablet Take 1 tablet by mouth 2 (two) times daily. (Patient not taking: Reported on 04/03/2015) 20 tablet 0   No current facility-administered medications on file prior to visit.   Review of Systems Constitutional: Negative for increased diaphoresis, other activity, appetite or siginficant weight change other than noted HENT: Negative for worsening hearing loss, ear pain, facial swelling, mouth sores and neck stiffness.   Eyes: Negative for other worsening pain, redness or visual disturbance.  Respiratory: Negative for shortness of breath and wheezing  Cardiovascular: Negative for chest pain and palpitations.  Gastrointestinal: Negative for diarrhea, blood in stool, abdominal distention or other pain Genitourinary: Negative for hematuria, flank pain or change in urine volume.  Musculoskeletal: Negative for myalgias or other joint complaints.  Skin: Negative for color change and wound or drainage.  Neurological: Negative for syncope and numbness. other than noted Hematological: Negative for adenopathy. or other swelling Psychiatric/Behavioral: Negative for  hallucinations, SI, self-injury, decreased concentration or other worsening agitation.      Objective:   Physical Exam BP 120/86 mmHg  Pulse 72  Temp(Src) 97.6 F (36.4 C) (Oral)  Wt 219 lb 9.6 oz (99.61 kg)  SpO2 96% VS noted,  Constitutional: Pt is oriented to person, place, and time. Appears well-developed and well-nourished, in no significant distress Head: Normocephalic and atraumatic.  Right Ear: External ear normal.  Left Ear: External ear normal.  Nose: Nose normal.  Mouth/Throat: Oropharynx is clear and moist.  Eyes: Conjunctivae and EOM are normal. Pupils are equal, round, and reactive to light.  Neck: Normal range of motion. Neck supple. No JVD present. No tracheal deviation present or significant neck LA or mass Cardiovascular: Normal rate, regular rhythm, normal heart sounds and intact distal pulses.   Pulmonary/Chest: Effort normal and breath sounds without rales or wheezing  Abdominal: Soft. Bowel sounds are normal. NT. No HSM  Musculoskeletal: Normal range of motion.  Exhibits no edema.  Lymphadenopathy:  Has no cervical adenopathy.  Neurological: Pt is alert and oriented to person, place, and time. Pt has normal reflexes. No cranial nerve deficit. Motor grossly intact Skin: Skin is warm and dry. No rash noted.  Psychiatric:  Has normal mood and affect. Behavior is normal.  Spine nontender Right knee with deg bony changes, decr ROM, no effusion     Assessment & Plan:

## 2015-04-03 NOTE — Assessment & Plan Note (Signed)
Likely related to underlying djd/ddd = - for lidoderm patch asd

## 2015-04-17 ENCOUNTER — Telehealth: Payer: Self-pay

## 2015-04-17 NOTE — Telephone Encounter (Signed)
PA request for Lidoderm 5% patch has been denied. Would MD prescribe alternative in PCP's absence, thanks

## 2015-04-17 NOTE — Telephone Encounter (Signed)
There aren't any good alternatives like this

## 2015-04-19 NOTE — Telephone Encounter (Signed)
Pt is requesting PCP advise on alternatives. Pt is aware MD out of office until 06/07

## 2015-04-19 NOTE — Telephone Encounter (Signed)
Given her other meds and hx, we could consider tramadol, or I could refer to Pain management, or even f/u with sports medicine with Dr Katrinka BlazingSmith or referral to ortho if she likes

## 2015-04-20 NOTE — Telephone Encounter (Signed)
Pt is requesting Rx for Tramadol

## 2015-04-22 MED ORDER — TRAMADOL HCL 50 MG PO TABS: 50.0000 mg | ORAL_TABLET | Freq: Three times a day (TID) | ORAL | Status: DC | PRN

## 2015-04-22 NOTE — Telephone Encounter (Signed)
[facility]

## 2015-04-23 NOTE — Telephone Encounter (Signed)
Called pt no answer [facility] 

## 2015-04-30 LAB — HM DIABETES EYE EXAM

## 2015-07-16 ENCOUNTER — Other Ambulatory Visit: Payer: Self-pay | Admitting: Internal Medicine

## 2015-07-17 NOTE — Telephone Encounter (Signed)
Rx faxed to pharmacy  

## 2015-07-17 NOTE — Telephone Encounter (Signed)
Done hardcopy to [name]  

## 2015-09-10 ENCOUNTER — Telehealth: Payer: Self-pay | Admitting: Internal Medicine

## 2015-09-10 MED ORDER — GLUCOSE BLOOD VI STRP: 1.0000 | ORAL_STRIP | Freq: Two times a day (BID) | Status: DC

## 2015-09-10 NOTE — Telephone Encounter (Signed)
Patient is requesting a script for test strips- freestyle meter [facility] [address]

## 2015-09-10 NOTE — Telephone Encounter (Signed)
[facility]

## 2015-09-28 ENCOUNTER — Other Ambulatory Visit (INDEPENDENT_AMBULATORY_CARE_PROVIDER_SITE_OTHER): Payer: Commercial Managed Care - HMO

## 2015-09-28 DIAGNOSIS — E119 Type 2 diabetes mellitus without complications: Secondary | ICD-10-CM | POA: Diagnosis not present

## 2015-09-28 LAB — BASIC METABOLIC PANEL
BUN: 25 mg/dL — ABNORMAL HIGH (ref 6–23)
CO2: 33 mEq/L — ABNORMAL HIGH (ref 19–32)
Calcium: 9.2 mg/dL (ref 8.4–10.5)
Chloride: 101 mEq/L (ref 96–112)
Creatinine, Ser: 1 mg/dL (ref 0.40–1.20)
GFR: 57.47 mL/min — AB (ref >60.00)
Glucose, Bld: 87 mg/dL (ref 70–99)
POTASSIUM: 4.3 meq/L (ref 3.5–5.1)
Sodium: 140 mEq/L (ref 135–145)

## 2015-09-28 LAB — HEPATIC FUNCTION PANEL
ALT: 12 U/L (ref 0–35)
AST: 11 U/L (ref 0–37)
Albumin: 3.6 g/dL (ref 3.5–5.2)
Alkaline Phosphatase: 78 U/L (ref 39–117)
BILIRUBIN TOTAL: 0.5 mg/dL (ref 0.2–1.2)
Bilirubin, Direct: 0.1 mg/dL (ref 0.0–0.3)
Total Protein: 7.6 g/dL (ref 6.0–8.3)

## 2015-09-28 LAB — LIPID PANEL
CHOL/HDL RATIO: 5
Cholesterol: 186 mg/dL (ref 0–200)
HDL: 35.2 mg/dL — ABNORMAL LOW (ref >39.00)
LDL CALC: 130 mg/dL — AB (ref 0–99)
NONHDL: 151.2
TRIGLYCERIDES: 107 mg/dL (ref 0.0–149.0)
VLDL: 21.4 mg/dL (ref 0.0–40.0)

## 2015-09-28 LAB — HEMOGLOBIN A1C: Hgb A1c MFr Bld: 6.6 % — ABNORMAL HIGH (ref 4.6–6.5)

## 2015-10-02 ENCOUNTER — Ambulatory Visit (INDEPENDENT_AMBULATORY_CARE_PROVIDER_SITE_OTHER): Payer: Commercial Managed Care - HMO | Admitting: Internal Medicine

## 2015-10-02 VITALS — BP 130/84 | HR 87 | Temp 97.7°F | Ht 63.0 in | Wt 223.0 lb

## 2015-10-02 DIAGNOSIS — E119 Type 2 diabetes mellitus without complications: Secondary | ICD-10-CM | POA: Diagnosis not present

## 2015-10-02 DIAGNOSIS — E7800 Pure hypercholesterolemia, unspecified: Secondary | ICD-10-CM

## 2015-10-02 DIAGNOSIS — Z0000 Encounter for general adult medical examination without abnormal findings: Secondary | ICD-10-CM

## 2015-10-02 DIAGNOSIS — I10 Essential (primary) hypertension: Secondary | ICD-10-CM | POA: Diagnosis not present

## 2015-10-02 DIAGNOSIS — Z0189 Encounter for other specified special examinations: Secondary | ICD-10-CM | POA: Diagnosis not present

## 2015-10-02 NOTE — Progress Notes (Signed)
Subjective:    Patient ID: Tracey [name], female    DOB: Oct 07, 1941, 74 y.o.   MRN: [id number]  HPI  Here to f/u; overall doing ok,  Pt denies chest pain, increasing sob or doe, wheezing, orthopnea, PND, increased LE swelling, palpitations, dizziness or syncope.  Pt denies new neurological symptoms such as new headache, or facial or extremity weakness or numbness.  Pt denies polydipsia, polyuria, or low sugar episode.   Pt denies new neurological symptoms such as new headache, or facial or extremity weakness or numbness.   Pt states overall good compliance with meds, mostly trying to follow appropriate diet, with wt overall stable,  but little exercise however. Taking statin qod, just cant take more due to myalgias. Also - I just dont feel good.  Cant lose wt. Mult stressors recently.  Heat pump and water heater went out  - over 1500$, relationship ended as well.  Wt Readings from Last 3 Encounters:  10/02/15 223 lb (101.152 kg)  04/03/15 219 lb 9.6 oz (99.61 kg)  10/03/14 215 lb (97.523 kg)   Past Medical History  Diagnosis Date  . CHF (congestive heart failure)   . Atrial fibrillation   . Diabetes mellitus without complication   . Hypertension   . Thyroid disease   . Coronary artery disease   . Hypothyroidism   . Anxiety   . Shortness of breath     WITH MY CHF  . Arthritis     RT KNEE  . Anemia   . ACS (acute coronary syndrome) 10/27/2013  . Diverticulosis 10/22/2005    Qualifier: Diagnosis of  By: Koleen Distance CMA (AAMA), Hulan Saas    . HIATAL HERNIA 10/22/2005    Qualifier: Diagnosis of  By: Koleen Distance CMA (AAMA), Hulan Saas    . HYPERCHOLESTEROLEMIA 03/09/2008    Qualifier: Diagnosis of  By: Koleen Distance CMA Duncan Dull), Hulan Saas     Past Surgical History  Procedure Laterality Date  . Abdominal hysterectomy    . Cardiac catheterization  10/27/2013  . Coronary angioplasty with stent placement  10/27/2013    DES   TO RCA       DR HARWANI  . Cholecystectomy    . Eye surgery    . Cataract extraction  Bilateral 2014  . Left and right heart catheterization with coronary angiogram N/A 09/20/2013    Procedure: LEFT AND RIGHT HEART CATHETERIZATION WITH CORONARY ANGIOGRAM;  Surgeon: Ricki Rodriguez, MD;  Location: [facility] CATH LAB;  Service: Cardiovascular;  Laterality: N/A;  . Percutaneous coronary stent intervention (pci-s) N/A 09/22/2013    Procedure: PERCUTANEOUS CORONARY STENT INTERVENTION (PCI-S);  Surgeon: Robynn Pane, MD;  Location: [facility] CATH LAB;  Service: Cardiovascular;  Laterality: N/A;  . Percutaneous coronary stent intervention (pci-s) N/A 10/27/2013    Procedure: PERCUTANEOUS CORONARY STENT INTERVENTION (PCI-S);  Surgeon: Robynn Pane, MD;  Location: [facility] CATH LAB;  Service: Cardiovascular;  Laterality: N/A;    reports that she has quit smoking. She has never used smokeless tobacco. She reports that she does not drink alcohol or use illicit drugs. family history includes Heart disease in her father. Allergies  Allergen Reactions  . Codeine Nausea And Vomiting  . Tramadol Other (See Comments)    Severe confusion   Current Outpatient Prescriptions on File Prior to Visit  Medication Sig Dispense Refill  . ALPRAZolam (XANAX) 0.5 MG tablet TAKE 1 TABLET TWICE DAILY AS NEEDED FOR ANXIETY 60 tablet 1  . carvedilol (COREG) 6.25 MG tablet Take 1 tablet (6.25 mg total)  by mouth 2 (two) times daily with a meal. 60 tablet 3  . clopidogrel (PLAVIX) 75 MG tablet Take 1 tablet (75 mg total) by mouth daily. 30 tMarland Kitchenablet 3  . diltiazem (CARDIZEM) 30 MG tablet Take 30 mg by mouth 2 (two) times daily.     . escitalopram (LEXAPRO) 20 MG tablet Take 1 tablet (20 mg total) by mouth daily. 90 tablet 3  . furosemide (LASIX) 40 MG tablet Take 1 tablet (40 mg total) by mouth 2 (two) times daily. 60 tablet 3  . glipiZIDE (GLUCOTROL) 10 MG tablet Take 1 tablet (10 mg total) by mouth 2 (two) times daily before a meal. 60 tablet 11  . glucose blood (FREESTYLE LITE) test strip 1 each by Other route 2 (two) times  daily. Use to check blood sugars twice a day Dx E11.9 100 each 3  . Insulin Glargine (LANTUS SOLOSTAR) 100 UNIT/ML Solostar Pen Inject 15 Units into the skin 2 (two) times daily. 15 mL 11  . levothyroxine (SYNTHROID, LEVOTHROID) 75 MCG tablet Take 1 tablet (75 mcg total) by mouth daily before breakfast. 90 tablet 3  . lovastatin (MEVACOR) 40 MG tablet Take 1 tablet (40 mg total) by mouth every other day. 45 tablet 3  . metFORMIN (GLUCOPHAGE) 500 MG tablet Take 500 mg by mouth 2 (two) times daily with a meal.    . metoprolol tartrate (LOPRESSOR) 25 MG tablet Take 25 mg by mouth daily.     . Multiple Vitamin (MULTIVITAMIN WITH MINERALS) TABS tablet Take 1 tablet by mouth daily.    . ramipril (ALTACE) 10 MG capsule Take 1 capsule (10 mg total) by mouth daily. 90 capsule 3  . spironolactone (ALDACTONE) 25 MG tablet Take 1 tablet (25 mg total) by mouth daily. 30 tablet 3  . warfarin (COUMADIN) 5 MG tablet Take 5 mg by mouth every evening.    . diclofenac sodium (VOLTAREN) 1 % GEL Apply 4 g topically 4 (four) times daily. As needed for pain (Patient not taking: Reported on 10/02/2015) 400 g 5  . lidocaine (LIDODERM) 5 % Place 1 patch onto the skin daily. Remove & Discard patch within 12 hours or as directed by MD (Patient not taking: Reported on 10/02/2015) 30 patch 5  . traMADol (ULTRAM) 50 MG tablet Take 1 tablet (50 mg total) by mouth every 8 (eight) hours as needed. (Patient not taking: Reported on 10/02/2015) 120 tablet 2   No current facility-administered medications on file prior to visit.    Review of Systems  Constitutional: Negative for unusual diaphoresis or night sweats HENT: Negative for ringing in ear or discharge Eyes: Negative for double vision or worsening visual disturbance.  Respiratory: Negative for choking and stridor.   Gastrointestinal: Negative for vomiting or other signifcant bowel change Genitourinary: Negative for hematuria or change in urine volume.  Musculoskeletal:  Negative for other MSK pain or swelling Skin: Negative for color change and worsening wound.  Neurological: Negative for tremors and numbness other than noted  Psychiatric/Behavioral: Negative for decreased concentration or agitation other than above       Objective:   Physical Exam BP 130/84 mmHg  Pulse 87  Temp(Src) 97.7 F (36.5 C) (Oral)  Ht  (1.6 m)  Wt 223 lb (101.152 kg)  BMI 39.51 kg/m2  SpO2 94% VS noted,  Constitutional: Pt appears in no significant distress HENT: Head: NCAT.  Right Ear: External ear normal.  Left Ear: External ear normal.  Eyes: . Pupils are equal, round, and  reactive to light. Conjunctivae and EOM are normal Neck: Normal range of motion. Neck supple.  Cardiovascular: Normal rate and regular rhythm.   Pulmonary/Chest: Effort normal and breath sounds without rales or wheezing.  Abd:  Soft, NT, ND, + BS Neurological: Pt is alert. Not confused , motor grossly intact Skin: Skin is warm. No rash, no LE edema Psychiatric: Pt behavior is normal. No agitation.     Assessment & Plan:

## 2015-10-02 NOTE — Progress Notes (Signed)
Pre visit review using our clinic review tool, if applicable. No additional management support is needed unless otherwise documented below in the visit note. 

## 2015-10-02 NOTE — Patient Instructions (Signed)
Please continue all other medications as before, and refills have been done if requested.  Please have the pharmacy call with any other refills you may need.  Please continue your efforts at being more active, low cholesterol diet, and weight control.  You are otherwise up to date with prevention measures today.  Please keep your appointments with your specialists as you may have planned  Please return in 6 months, or sooner if needed, with Lab testing done 3-5 days before  

## 2015-10-02 NOTE — Assessment & Plan Note (Signed)
stable overall by history and exam, recent data reviewed with pt, and pt to continue medical treatment as before,  to f/u any worsening symptoms or concerns Lab Results  Component Value Date   HGBA1C 6.6* 09/28/2015

## 2015-10-02 NOTE — Assessment & Plan Note (Addendum)
stable overall by history and exam, recent data reviewed with pt, and pt to continue medical treatment as before,  to f/u any worsening symptoms or concerns Lab Results  Component Value Date   LDLCALC 130* 09/28/2015   Cont her statin qod as she is doing, declines consider further change

## 2015-10-02 NOTE — Assessment & Plan Note (Signed)
stable overall by history and exam, recent data reviewed with pt, and pt to continue medical treatment as before,  to f/u any worsening symptoms or concerns BP Readings from Last 3 Encounters:  10/02/15 130/84  04/03/15 120/86  10/03/14 132/76

## 2015-10-30 ENCOUNTER — Other Ambulatory Visit: Payer: Self-pay | Admitting: Internal Medicine

## 2015-10-31 NOTE — Telephone Encounter (Signed)
Rx faxed to pharmacy  

## 2015-11-16 ENCOUNTER — Ambulatory Visit (INDEPENDENT_AMBULATORY_CARE_PROVIDER_SITE_OTHER): Payer: Commercial Managed Care - HMO | Admitting: Family

## 2015-11-16 ENCOUNTER — Encounter: Payer: Self-pay | Admitting: Family

## 2015-11-16 VITALS — BP 130/76 | HR 93 | Temp 97.6°F | Resp 18 | Ht 63.0 in | Wt 233.0 lb

## 2015-11-16 DIAGNOSIS — L03115 Cellulitis of right lower limb: Secondary | ICD-10-CM | POA: Diagnosis not present

## 2015-11-16 DIAGNOSIS — L0390 Cellulitis, unspecified: Secondary | ICD-10-CM | POA: Insufficient documentation

## 2015-11-16 MED ORDER — DOXYCYCLINE HYCLATE 100 MG PO TABS: 100.0000 mg | ORAL_TABLET | Freq: Two times a day (BID) | ORAL | Status: DC

## 2015-11-16 MED ORDER — OXYCODONE-ACETAMINOPHEN 5-325 MG PO TABS: 0.5000 | ORAL_TABLET | Freq: Three times a day (TID) | ORAL | Status: DC | PRN

## 2015-11-16 NOTE — Assessment & Plan Note (Addendum)
Symptoms and exam consistent with cellulitis and resolving contusion to the right lower extremity. Low concern for blood clot as patient is on Coumadin and within the goal INR. Start doxycycline. Start Percocet as needed for pain. Advised patient of increased risk of falls with this medication and she is understanding and wishes to continue. Indicates that Tylenol does nothing for the pain, and is unable to take any other pain medications secondary to her coumadin and allergy to Tramadol. Given instructions to monitor for bleeding as doxycycline increases effects of coumadin and may increase risk of bleeding. Keep her leg elevated. Follow up if symptoms worsen, fever develops, or fails to improve.

## 2015-11-16 NOTE — Progress Notes (Signed)
Subjective:    Patient ID: Tracey [name], female    DOB: 1940-12-11, 74 y.o.   MRN: [id number]  Chief Complaint  Patient presents with  . Fall    had a fall on the 19th, having pain in the calf of her right leg down to her ankle, has some bruising on legs    HPI:  Tracey [name] is a 74 y.o. female who  has a past medical history of CHF (congestive heart failure) (HCC); Atrial fibrillation (HCC); Diabetes mellitus without complication (HCC); Hypertension; Thyroid disease; Coronary artery disease; Hypothyroidism; Anxiety; Shortness of breath; Arthritis; Anemia; ACS (acute coronary syndrome) (HCC) (10/27/2013); Diverticulosis (10/22/2005); HIATAL HERNIA (10/22/2005); and HYPERCHOLESTEROLEMIA (03/09/2008). and presents today for an acute office visit.  This is a new problem. Associated symptom of pain located in her right leg has been going on for about 1.5 weeks following a fall when she was going out the door to check the mail. EMS was called and helped her up and released her. Pain is located specifically in the calf and ankle that started yesterday. Pain is constant and sharp on occasion. Reports the course was getting better until yesterday. Denies any modifying factors in attempted treatment that make it better or worse. She is on Warfarin. Denies sounds/sensations heard or felt. Denies striking her head or LOC.  Allergies  Allergen Reactions  . Codeine Nausea And Vomiting  . Tramadol Other (See Comments)    Severe confusion     Current Outpatient Prescriptions on File Prior to Visit  Medication Sig Dispense Refill  . ALPRAZolam (XANAX) 0.5 MG tablet TAKE 1 TABLET BY MOUTH TWICE A DAY AS NEEDED FOR ANXIETY 60 tablet 1  . carvedilol (COREG) 6.25 MG tablet Take 1 tablet (6.25 mg total) by mouth 2 (two) times daily with a meal. 60 tablet 3  . clopidogrel (PLAVIX) 75 MG tablet Take 1 tablet (75 mg total) by mouth daily. 30 tablet 3  . diclofenac sodium (VOLTAREN) 1 % GEL Apply 4 g  topically 4 (four) times daily. As needed for pain 400 g 5  . diltiazem (CARDIZEM) 30 MG tablet Take 30 mg by mouth 2 (two) times daily.     Marland Kitchen escitalopram (LEXAPRO) 20 MG tablet Take 1 tablet (20 mg total) by mouth daily. 90 tablet 3  . furosemide (LASIX) 40 MG tablet Take 1 tablet (40 mg total) by mouth 2 (two) times daily. 60 tablet 3  . glipiZIDE (GLUCOTROL) 10 MG tablet TAKE 1 TABLET (10 MG TOTAL) BY MOUTH 2 (TWO) TIMES DAILY BEFORE A MEAL. 60 tablet 10  . glucose blood (FREESTYLE LITE) test strip 1 each by Other route 2 (two) times daily. Use to check blood sugars twice a day Dx E11.9 100 each 3  . Insulin Glargine (LANTUS SOLOSTAR) 100 UNIT/ML Solostar Pen Inject 15 Units into the skin 2 (two) times daily. 15 mL 11  . levothyroxine (SYNTHROID, LEVOTHROID) 75 MCG tablet Take 1 tablet (75 mcg total) by mouth daily before breakfast. 90 tablet 3  . lovastatin (MEVACOR) 40 MG tablet Take 1 tablet (40 mg total) by mouth every other day. 45 tablet 3  . metFORMIN (GLUCOPHAGE) 500 MG tablet Take 500 mg by mouth 2 (two) times daily with a meal.    . metoprolol tartrate (LOPRESSOR) 25 MG tablet Take 25 mg by mouth daily.     . Multiple Vitamin (MULTIVITAMIN WITH MINERALS) TABS tablet Take 1 tablet by mouth daily.    . ramipril (ALTACE) 10  MG capsule Take 1 capsule (10 mg total) by mouth daily. 90 capsule 3  . spironolactone (ALDACTONE) 25 MG tablet Take 1 tablet (25 mg total) by mouth daily. 30 tablet 3  . warfarin (COUMADIN) 5 MG tablet Take 5 mg by mouth every evening.     No current facility-administered medications on file prior to visit.    Past Medical History  Diagnosis Date  . CHF (congestive heart failure) (HCC)   . Atrial fibrillation (HCC)   . Diabetes mellitus without complication (HCC)   . Hypertension   . Thyroid disease   . Coronary artery disease   . Hypothyroidism   . Anxiety   . Shortness of breath     WITH MY CHF  . Arthritis     RT KNEE  . Anemia   . ACS (acute  coronary syndrome) (HCC) 10/27/2013  . Diverticulosis 10/22/2005    Qualifier: Diagnosis of  By: Koleen DistanceKowalk CMA (AAMA), Hulan SaasLeisha    . HIATAL HERNIA 10/22/2005    Qualifier: Diagnosis of  By: Koleen DistanceKowalk CMA (AAMA), Hulan SaasLeisha    . HYPERCHOLESTEROLEMIA 03/09/2008    Qualifier: Diagnosis of  By: Koleen DistanceKowalk CMA (AAMA), Hulan SaasLeisha      Review of Systems  Constitutional: Negative for fever and chills.  Musculoskeletal:       Positive for right leg pain.  Skin: Positive for wound (Positive for abraisions. ).      Objective:    BP 130/76 mmHg  Pulse 93  Temp(Src) 97.6 F (36.4 C) (Oral)  Resp 18  Ht 5\' 3"  (1.6 m)  Wt 233 lb (105.688 kg)  BMI 41.28 kg/m2  SpO2 93% Nursing note and vital signs reviewed.  Physical Exam  Constitutional: She is oriented to person, place, and time. She appears well-developed and well-nourished. No distress.  Cardiovascular: Normal rate, regular rhythm, normal heart sounds and intact distal pulses.   Pulmonary/Chest: Effort normal and breath sounds normal.  Musculoskeletal:  Right lower extremity - there is mild redness and moderate edema compared to the contralateral side with no obvious deformities. Skin is warm to touch and tender in her lower extremity mostly in the anterior aspect of the lower leg. No calf tenderness. Negative Homans sign. Distal pulses and sensation are intact and appropriate.  Neurological: She is alert and oriented to person, place, and time.  Skin: Skin is warm and dry.  Psychiatric: She has a normal mood and affect. Her behavior is normal. Judgment and thought content normal.       Assessment & Plan:   Problem List Items Addressed This Visit      Other   Cellulitis - Primary    Symptoms and exam consistent with cellulitis and resolving contusion to the right lower extremity. Low concern for blood clot as patient is on Coumadin and within the goal INR. Start doxycycline. Start Percocet as needed for pain. Advised patient of increased risk of falls  with this medication and she is understanding and wishes to continue. Indicates that Tylenol does nothing for the pain, and is unable to take any other pain medications secondary to her coumadin and allergy to Tramadol. Given instructions to monitor for bleeding as doxycycline increases effects of coumadin and may increase risk of bleeding. Keep her leg elevated. Follow up if symptoms worsen, fever develops, or fails to improve.       Relevant Medications   oxyCODONE-acetaminophen (ROXICET) 5-325 MG tablet   doxycycline (VIBRA-TABS) 100 MG tablet

## 2015-11-16 NOTE — Patient Instructions (Addendum)
Thank you for choosing [facility].  Summary/Instructions:  Your prescription(s) have been submitted to your pharmacy or been printed and provided for you. Please take as directed and contact our office if you believe you are having problem(s) with the medication(s) or have any questions.  If your symptoms worsen or fail to improve, please contact our office for further instruction, or in case of emergency go directly to the emergency room at the closest medical facility.   Cellulitis Cellulitis is an infection of the skin and the tissue beneath it. The infected area is usually red and tender. Cellulitis occurs most often in the arms and lower legs.  CAUSES  Cellulitis is caused by bacteria that enter the skin through cracks or cuts in the skin. The most common types of bacteria that cause cellulitis are staphylococci and streptococci. SIGNS AND SYMPTOMS   Redness and warmth.  Swelling.  Tenderness or pain.  Fever. DIAGNOSIS  Your health care provider can usually determine what is wrong based on a physical exam. Blood tests may also be done. TREATMENT  Treatment usually involves taking an antibiotic medicine. HOME CARE INSTRUCTIONS   Take your antibiotic medicine as directed by your health care provider. Finish the antibiotic even if you start to feel better.  Keep the infected arm or leg elevated to reduce swelling.  Apply a warm cloth to the affected area up to 4 times per day to relieve pain.  Take medicines only as directed by your health care provider.  Keep all follow-up visits as directed by your health care provider. SEEK MEDICAL CARE IF:   You notice red streaks coming from the infected area.  Your red area gets larger or turns dark in color.  Your bone or joint underneath the infected area becomes painful after the skin has healed.  Your infection returns in the same area or another area.  You notice a swollen bump in the infected area.  You develop  new symptoms.  You have a fever. SEEK IMMEDIATE MEDICAL CARE IF:   You feel very sleepy.  You develop vomiting or diarrhea.  You have a general ill feeling (malaise) with muscle aches and pains.   This information is not intended to replace advice given to you by your health care provider. Make sure you discuss any questions you have with your health care provider.   Document Released: 08/13/2005 Document Revised: 07/25/2015 Document Reviewed: 01/19/2012 Elsevier Interactive Patient Education 2016 [employer]   

## 2015-12-03 ENCOUNTER — Inpatient Hospital Stay (HOSPITAL_COMMUNITY)
Admission: AD | Admit: 2015-12-03 | Discharge: 2015-12-12 | DRG: 291 | Disposition: A | Discharge status: Skilled Nursing Facility | Payer: Commercial Managed Care - HMO | Source: Ambulatory Visit | Attending: Cardiovascular Disease | Admitting: Cardiovascular Disease

## 2015-12-03 ENCOUNTER — Encounter (HOSPITAL_COMMUNITY): Payer: Self-pay | Admitting: General Practice

## 2015-12-03 ENCOUNTER — Inpatient Hospital Stay (HOSPITAL_COMMUNITY): Payer: Commercial Managed Care - HMO

## 2015-12-03 DIAGNOSIS — Z9841 Cataract extraction status, right eye: Secondary | ICD-10-CM | POA: Diagnosis not present

## 2015-12-03 DIAGNOSIS — D6832 Hemorrhagic disorder due to extrinsic circulating anticoagulants: Secondary | ICD-10-CM | POA: Diagnosis not present

## 2015-12-03 DIAGNOSIS — Z9842 Cataract extraction status, left eye: Secondary | ICD-10-CM | POA: Diagnosis not present

## 2015-12-03 DIAGNOSIS — Z7901 Long term (current) use of anticoagulants: Secondary | ICD-10-CM | POA: Diagnosis not present

## 2015-12-03 DIAGNOSIS — I5023 Acute on chronic systolic (congestive) heart failure: Secondary | ICD-10-CM | POA: Diagnosis not present

## 2015-12-03 DIAGNOSIS — Z961 Presence of intraocular lens: Secondary | ICD-10-CM | POA: Diagnosis present

## 2015-12-03 DIAGNOSIS — I2510 Atherosclerotic heart disease of native coronary artery without angina pectoris: Secondary | ICD-10-CM | POA: Diagnosis present

## 2015-12-03 DIAGNOSIS — Z955 Presence of coronary angioplasty implant and graft: Secondary | ICD-10-CM

## 2015-12-03 DIAGNOSIS — L03115 Cellulitis of right lower limb: Secondary | ICD-10-CM | POA: Diagnosis present

## 2015-12-03 DIAGNOSIS — T45515A Adverse effect of anticoagulants, initial encounter: Secondary | ICD-10-CM | POA: Diagnosis not present

## 2015-12-03 DIAGNOSIS — Z794 Long term (current) use of insulin: Secondary | ICD-10-CM

## 2015-12-03 DIAGNOSIS — Z4659 Encounter for fitting and adjustment of other gastrointestinal appliance and device: Secondary | ICD-10-CM

## 2015-12-03 DIAGNOSIS — F329 Major depressive disorder, single episode, unspecified: Secondary | ICD-10-CM | POA: Diagnosis present

## 2015-12-03 DIAGNOSIS — E11649 Type 2 diabetes mellitus with hypoglycemia without coma: Secondary | ICD-10-CM | POA: Diagnosis present

## 2015-12-03 DIAGNOSIS — I509 Heart failure, unspecified: Secondary | ICD-10-CM

## 2015-12-03 DIAGNOSIS — J9622 Acute and chronic respiratory failure with hypercapnia: Secondary | ICD-10-CM | POA: Diagnosis not present

## 2015-12-03 DIAGNOSIS — M1990 Unspecified osteoarthritis, unspecified site: Secondary | ICD-10-CM | POA: Diagnosis present

## 2015-12-03 DIAGNOSIS — J690 Pneumonitis due to inhalation of food and vomit: Secondary | ICD-10-CM | POA: Diagnosis not present

## 2015-12-03 DIAGNOSIS — I110 Hypertensive heart disease with heart failure: Secondary | ICD-10-CM | POA: Diagnosis present

## 2015-12-03 DIAGNOSIS — E039 Hypothyroidism, unspecified: Secondary | ICD-10-CM | POA: Diagnosis present

## 2015-12-03 DIAGNOSIS — Z79899 Other long term (current) drug therapy: Secondary | ICD-10-CM | POA: Diagnosis not present

## 2015-12-03 DIAGNOSIS — Z7902 Long term (current) use of antithrombotics/antiplatelets: Secondary | ICD-10-CM | POA: Diagnosis not present

## 2015-12-03 DIAGNOSIS — J45909 Unspecified asthma, uncomplicated: Secondary | ICD-10-CM | POA: Diagnosis present

## 2015-12-03 DIAGNOSIS — R579 Shock, unspecified: Secondary | ICD-10-CM | POA: Diagnosis not present

## 2015-12-03 DIAGNOSIS — R570 Cardiogenic shock: Secondary | ICD-10-CM | POA: Diagnosis present

## 2015-12-03 DIAGNOSIS — Z6841 Body Mass Index (BMI) 40.0 and over, adult: Secondary | ICD-10-CM

## 2015-12-03 DIAGNOSIS — E872 Acidosis: Secondary | ICD-10-CM | POA: Diagnosis present

## 2015-12-03 DIAGNOSIS — G9341 Metabolic encephalopathy: Secondary | ICD-10-CM | POA: Diagnosis present

## 2015-12-03 DIAGNOSIS — E7800 Pure hypercholesterolemia, unspecified: Secondary | ICD-10-CM | POA: Diagnosis present

## 2015-12-03 DIAGNOSIS — J9602 Acute respiratory failure with hypercapnia: Secondary | ICD-10-CM | POA: Diagnosis not present

## 2015-12-03 DIAGNOSIS — D649 Anemia, unspecified: Secondary | ICD-10-CM | POA: Diagnosis present

## 2015-12-03 DIAGNOSIS — F419 Anxiety disorder, unspecified: Secondary | ICD-10-CM | POA: Diagnosis present

## 2015-12-03 DIAGNOSIS — Z9289 Personal history of other medical treatment: Secondary | ICD-10-CM

## 2015-12-03 DIAGNOSIS — J9601 Acute respiratory failure with hypoxia: Secondary | ICD-10-CM | POA: Diagnosis not present

## 2015-12-03 DIAGNOSIS — Z87891 Personal history of nicotine dependence: Secondary | ICD-10-CM

## 2015-12-03 DIAGNOSIS — I4510 Unspecified right bundle-branch block: Secondary | ICD-10-CM | POA: Diagnosis present

## 2015-12-03 DIAGNOSIS — G9340 Encephalopathy, unspecified: Secondary | ICD-10-CM

## 2015-12-03 DIAGNOSIS — E119 Type 2 diabetes mellitus without complications: Secondary | ICD-10-CM | POA: Diagnosis not present

## 2015-12-03 DIAGNOSIS — R0602 Shortness of breath: Secondary | ICD-10-CM | POA: Diagnosis present

## 2015-12-03 DIAGNOSIS — L8990 Pressure ulcer of unspecified site, unspecified stage: Secondary | ICD-10-CM | POA: Insufficient documentation

## 2015-12-03 DIAGNOSIS — F411 Generalized anxiety disorder: Secondary | ICD-10-CM | POA: Diagnosis present

## 2015-12-03 DIAGNOSIS — N179 Acute kidney failure, unspecified: Secondary | ICD-10-CM | POA: Diagnosis present

## 2015-12-03 DIAGNOSIS — I10 Essential (primary) hypertension: Secondary | ICD-10-CM | POA: Diagnosis present

## 2015-12-03 DIAGNOSIS — I482 Chronic atrial fibrillation: Secondary | ICD-10-CM | POA: Diagnosis present

## 2015-12-03 DIAGNOSIS — Z01818 Encounter for other preprocedural examination: Secondary | ICD-10-CM

## 2015-12-03 HISTORY — DX: Personal history of other medical treatment: Z92.89

## 2015-12-03 HISTORY — DX: Type 2 diabetes mellitus without complications: E11.9

## 2015-12-03 LAB — CBC WITH DIFFERENTIAL/PLATELET
BASOS ABS: 0.1 10*3/uL (ref 0.0–0.1)
BASOS PCT: 1 %
Eosinophils Absolute: 0.3 10*3/uL (ref 0.0–0.7)
Eosinophils Relative: 4 %
HEMATOCRIT: 38.9 % (ref 36.0–46.0)
HEMOGLOBIN: 10.8 g/dL — AB (ref 12.0–15.0)
LYMPHS ABS: 1.3 10*3/uL (ref 0.7–4.0)
LYMPHS PCT: 15 %
MCH: 23.6 pg — ABNORMAL LOW (ref 26.0–34.0)
MCHC: 27.8 g/dL — ABNORMAL LOW (ref 30.0–36.0)
MCV: 84.9 fL (ref 78.0–100.0)
MONO ABS: 1.2 10*3/uL — AB (ref 0.1–1.0)
Monocytes Relative: 14 %
Neutro Abs: 6 10*3/uL (ref 1.7–7.7)
Neutrophils Relative %: 66 %
Platelets: 318 10*3/uL (ref 150–400)
RBC: 4.58 MIL/uL (ref 3.87–5.11)
RDW: 16.5 % — ABNORMAL HIGH (ref 11.5–15.5)
WBC: 8.9 10*3/uL (ref 4.0–10.5)

## 2015-12-03 LAB — COMPREHENSIVE METABOLIC PANEL
ALBUMIN: 3.2 g/dL — AB (ref 3.5–5.0)
ALT: 11 U/L — AB (ref 14–54)
AST: 13 U/L — AB (ref 15–41)
Alkaline Phosphatase: 70 U/L (ref 38–126)
Anion gap: 11 (ref 5–15)
BILIRUBIN TOTAL: 0.6 mg/dL (ref 0.3–1.2)
BUN: 19 mg/dL (ref 6–20)
CO2: 35 mmol/L — ABNORMAL HIGH (ref 22–32)
CREATININE: 0.91 mg/dL (ref 0.44–1.00)
Calcium: 8.4 mg/dL — ABNORMAL LOW (ref 8.9–10.3)
Chloride: 100 mmol/L — ABNORMAL LOW (ref 101–111)
GFR calc Af Amer: >60 mL/min (ref >60)
GLUCOSE: 93 mg/dL (ref 65–99)
Potassium: 3.4 mmol/L — ABNORMAL LOW (ref 3.5–5.1)
Sodium: 146 mmol/L — ABNORMAL HIGH (ref 135–145)
TOTAL PROTEIN: 7 g/dL (ref 6.5–8.1)

## 2015-12-03 LAB — TROPONIN I
Troponin I: <0.03 ng/mL (ref <0.031)
Troponin I: <0.03 ng/mL (ref <0.031)

## 2015-12-03 LAB — PROTIME-INR
INR: 2.17 — AB (ref 0.00–1.49)
Prothrombin Time: 24 seconds — ABNORMAL HIGH (ref 11.6–15.2)

## 2015-12-03 LAB — GLUCOSE, CAPILLARY
GLUCOSE-CAPILLARY: 98 mg/dL (ref 65–99)
Glucose-Capillary: 112 mg/dL — ABNORMAL HIGH (ref 65–99)

## 2015-12-03 LAB — BRAIN NATRIURETIC PEPTIDE: B NATRIURETIC PEPTIDE 5: 322.7 pg/mL — AB (ref 0.0–100.0)

## 2015-12-03 MED ORDER — GLIPIZIDE 10 MG PO TABS
10.0000 mg | ORAL_TABLET | Freq: Two times a day (BID) | ORAL | Status: DC
  Administered 2015-12-03 – 2015-12-04 (×2): 10 mg via ORAL
  Filled 2015-12-03 (×2): qty 1

## 2015-12-03 MED ORDER — SPIRONOLACTONE 25 MG PO TABS: 25.0000 mg | ORAL_TABLET | Freq: Every day | ORAL | Status: DC

## 2015-12-03 MED ORDER — WARFARIN SODIUM 5 MG PO TABS
5.0000 mg | ORAL_TABLET | Freq: Every evening | ORAL | Status: DC
  Administered 2015-12-03 – 2015-12-04 (×2): 5 mg via ORAL
  Filled 2015-12-03 (×2): qty 1

## 2015-12-03 MED ORDER — ONDANSETRON HCL 4 MG/2ML IJ SOLN
4.0000 mg | Freq: Four times a day (QID) | INTRAMUSCULAR | Status: DC | PRN
  Administered 2015-12-04: 4 mg via INTRAVENOUS
  Filled 2015-12-03: qty 2

## 2015-12-03 MED ORDER — DICLOFENAC SODIUM 1 % TD GEL: 4.0000 g | Freq: Four times a day (QID) | TRANSDERMAL | Status: DC

## 2015-12-03 MED ORDER — METFORMIN HCL 500 MG PO TABS
500.0000 mg | ORAL_TABLET | Freq: Two times a day (BID) | ORAL | Status: DC
Start: 2015-12-03 — End: 2015-12-04
  Administered 2015-12-03 – 2015-12-04 (×2): 500 mg via ORAL
  Filled 2015-12-03 (×2): qty 1

## 2015-12-03 MED ORDER — ESCITALOPRAM OXALATE 10 MG PO TABS
20.0000 mg | ORAL_TABLET | Freq: Every day | ORAL | Status: DC
  Administered 2015-12-04: 20 mg via ORAL
  Filled 2015-12-03: qty 1

## 2015-12-03 MED ORDER — POTASSIUM CHLORIDE ER 10 MEQ PO TBCR
20.0000 meq | EXTENDED_RELEASE_TABLET | Freq: Once | ORAL | Status: AC
  Administered 2015-12-03: 20 meq via ORAL
  Filled 2015-12-03 (×2): qty 2

## 2015-12-03 MED ORDER — POTASSIUM CHLORIDE ER 10 MEQ PO TBCR
10.0000 meq | EXTENDED_RELEASE_TABLET | Freq: Three times a day (TID) | ORAL | Status: DC
  Administered 2015-12-03 (×2): 10 meq via ORAL
  Filled 2015-12-03 (×7): qty 1

## 2015-12-03 MED ORDER — ACETAMINOPHEN 325 MG PO TABS
650.0000 mg | ORAL_TABLET | ORAL | Status: DC | PRN
  Administered 2015-12-06: 650 mg via ORAL
  Filled 2015-12-03: qty 2

## 2015-12-03 MED ORDER — SODIUM CHLORIDE 0.9 % IV SOLN: 250.0000 mL | INTRAVENOUS | Status: DC | PRN

## 2015-12-03 MED ORDER — RAMIPRIL 10 MG PO CAPS
10.0000 mg | ORAL_CAPSULE | Freq: Every day | ORAL | Status: DC
  Administered 2015-12-04: 10 mg via ORAL
  Filled 2015-12-03: qty 1

## 2015-12-03 MED ORDER — LEVOTHYROXINE SODIUM 75 MCG PO TABS
75.0000 ug | ORAL_TABLET | Freq: Every day | ORAL | Status: DC
  Administered 2015-12-04 – 2015-12-06 (×3): 75 ug via ORAL
  Filled 2015-12-03 (×3): qty 1

## 2015-12-03 MED ORDER — DILTIAZEM HCL 30 MG PO TABS
30.0000 mg | ORAL_TABLET | Freq: Two times a day (BID) | ORAL | Status: DC
  Administered 2015-12-03 – 2015-12-05 (×4): 30 mg via ORAL
  Filled 2015-12-03 (×4): qty 1

## 2015-12-03 MED ORDER — INSULIN ASPART 100 UNIT/ML ~~LOC~~ SOLN: 0.0000 [IU] | Freq: Three times a day (TID) | SUBCUTANEOUS | Status: DC

## 2015-12-03 MED ORDER — INSULIN ASPART 100 UNIT/ML ~~LOC~~ SOLN
4.0000 [IU] | Freq: Three times a day (TID) | SUBCUTANEOUS | Status: DC
  Administered 2015-12-04: 4 [IU] via SUBCUTANEOUS

## 2015-12-03 MED ORDER — SODIUM CHLORIDE 0.9 % IJ SOLN: 3.0000 mL | INTRAMUSCULAR | Status: DC | PRN

## 2015-12-03 MED ORDER — ALPRAZOLAM 0.5 MG PO TABS
0.5000 mg | ORAL_TABLET | Freq: Two times a day (BID) | ORAL | Status: DC | PRN
  Administered 2015-12-03: 0.5 mg via ORAL
  Filled 2015-12-03: qty 1

## 2015-12-03 MED ORDER — WARFARIN - PHYSICIAN DOSING INPATIENT
Freq: Every day | Status: DC
  Administered 2015-12-03: 1

## 2015-12-03 MED ORDER — CARVEDILOL 6.25 MG PO TABS
6.2500 mg | ORAL_TABLET | Freq: Two times a day (BID) | ORAL | Status: DC
  Administered 2015-12-03 – 2015-12-04 (×2): 6.25 mg via ORAL
  Filled 2015-12-03 (×2): qty 1

## 2015-12-03 MED ORDER — PRAVASTATIN SODIUM 40 MG PO TABS
40.0000 mg | ORAL_TABLET | Freq: Every day | ORAL | Status: DC
Start: 2015-12-03 — End: 2015-12-12
  Administered 2015-12-03 – 2015-12-11 (×9): 40 mg via ORAL
  Filled 2015-12-03 (×10): qty 1

## 2015-12-03 MED ORDER — CLOPIDOGREL BISULFATE 75 MG PO TABS
75.0000 mg | ORAL_TABLET | Freq: Every day | ORAL | Status: DC
  Administered 2015-12-04 – 2015-12-12 (×9): 75 mg via ORAL
  Filled 2015-12-03 (×9): qty 1

## 2015-12-03 MED ORDER — FUROSEMIDE 10 MG/ML IJ SOLN
40.0000 mg | Freq: Two times a day (BID) | INTRAMUSCULAR | Status: DC
  Administered 2015-12-03 – 2015-12-04 (×2): 40 mg via INTRAVENOUS
  Filled 2015-12-03 (×2): qty 4

## 2015-12-03 MED ORDER — SODIUM CHLORIDE 0.9 % IJ SOLN
3.0000 mL | Freq: Two times a day (BID) | INTRAMUSCULAR | Status: DC
  Administered 2015-12-03 – 2015-12-06 (×7): 3 mL via INTRAVENOUS

## 2015-12-03 MED ORDER — OXYCODONE-ACETAMINOPHEN 5-325 MG PO TABS: 0.5000 | ORAL_TABLET | Freq: Three times a day (TID) | ORAL | Status: DC | PRN

## 2015-12-03 MED ORDER — INSULIN GLARGINE 100 UNIT/ML ~~LOC~~ SOLN
15.0000 [IU] | Freq: Two times a day (BID) | SUBCUTANEOUS | Status: DC
  Administered 2015-12-03 – 2015-12-04 (×2): 15 [IU] via SUBCUTANEOUS
  Filled 2015-12-03 (×3): qty 0.15

## 2015-12-03 NOTE — Progress Notes (Signed)
  Echocardiogram 2D Echocardiogram has been performed.  Tracey SavoyCasey N Nickolai Rinks 12/03/2015, 4:02 PM

## 2015-12-03 NOTE — H&P (Signed)
Referring Physician:  [name] is an 75 y.o. female.                       Chief Complaint: Shortness of breath  HPI: 75 year old female with past medical history of CHF, CAD, DM, II, Obesity, Dyslipidemia, Hypothyroidism, Arthritis and anemia, has 1 month history of weight gain, leg edema and shortness of breath, minimally responding to oral lasix.  Past Medical History  Diagnosis Date  . CHF (congestive heart failure) ([place])   . Atrial fibrillation ([place])   . Diabetes mellitus without complication ([place])   . Hypertension   . Thyroid disease   . Coronary artery disease   . Hypothyroidism   . Anxiety   . Shortness of breath     WITH MY CHF  . Arthritis     RT KNEE  . Anemia   . ACS (acute coronary syndrome) ([place]) 10/27/2013  . Diverticulosis 10/22/2005    Qualifier: Diagnosis of  By: Nils Pyle CMA (AAMA), Mearl Latin    . HIATAL HERNIA 10/22/2005    Qualifier: Diagnosis of  By: Nils Pyle CMA ([place]), Mearl Latin    . HYPERCHOLESTEROLEMIA 03/09/2008    Qualifier: Diagnosis of  By: Nils Pyle CMA Deborra Medina), Mearl Latin        Past Surgical History  Procedure Laterality Date  . Abdominal hysterectomy    . Cardiac catheterization  10/27/2013  . Coronary angioplasty with stent placement  10/27/2013    DES   TO RCA       DR HARWANI  . Cholecystectomy    . Eye surgery    . Cataract extraction Bilateral 2014  . Left and right heart catheterization with coronary angiogram N/A 09/20/2013    Procedure: LEFT AND RIGHT HEART CATHETERIZATION WITH CORONARY ANGIOGRAM;  Surgeon: Birdie Riddle, MD;  Location: [facility] CATH LAB;  Service: Cardiovascular;  Laterality: N/A;  . Percutaneous coronary stent intervention (pci-s) N/A 09/22/2013    Procedure: PERCUTANEOUS CORONARY STENT INTERVENTION (PCI-S);  Surgeon: Clent Demark, MD;  Location: [facility] CATH LAB;  Service: Cardiovascular;  Laterality: N/A;  . Percutaneous coronary stent intervention (pci-s) N/A 10/27/2013    Procedure: PERCUTANEOUS CORONARY STENT INTERVENTION  (PCI-S);  Surgeon: Clent Demark, MD;  Location: [facility] CATH LAB;  Service: Cardiovascular;  Laterality: N/A;    Family History  Problem Relation Age of Onset  . Heart disease Father    Social History:  reports that she has quit smoking. She has never used smokeless tobacco. She reports that she does not drink alcohol or use illicit drugs.  Allergies:  Allergies  Allergen Reactions  . Tramadol Other (See Comments)    Severe confusion  . Codeine Nausea And Vomiting  . Doxycycline Itching and Rash    Medications Prior to Admission  Medication Sig Dispense Refill  . ALPRAZolam (XANAX) 0.5 MG tablet TAKE 1 TABLET BY MOUTH TWICE A DAY AS NEEDED FOR ANXIETY (Patient taking differently: Take 0.5 mg by mouth daily at bedtime.) 60 tablet 1  . carvedilol (COREG) 6.25 MG tablet Take 1 tablet (6.25 mg total) by mouth 2 (two) times daily with a meal. 60 tablet 3  . clopidogrel (PLAVIX) 75 MG tablet Take 1 tablet (75 mg total) by mouth daily. 30 tablet 3  . diltiazem (CARDIZEM) 30 MG tablet Take 30 mg by mouth 2 (two) times daily.     Marland Kitchen escitalopram (LEXAPRO) 20 MG tablet Take 1 tablet (20 mg total) by mouth daily. 90 tablet 3  .  furosemide (LASIX) 40 MG tablet Take 1 tablet (40 mg total) by mouth 2 (two) times daily. (Patient taking differently: Take 40 mg by mouth 3 (three) times daily. ) 60 tablet 3  . glipiZIDE (GLUCOTROL) 10 MG tablet TAKE 1 TABLET (10 MG TOTAL) BY MOUTH 2 (TWO) TIMES DAILY BEFORE A MEAL. 60 tablet 10  . Insulin Glargine (LANTUS SOLOSTAR) 100 UNIT/ML Solostar Pen Inject 15 Units into the skin 2 (two) times daily. 15 mL 11  . levothyroxine (SYNTHROID, LEVOTHROID) 75 MCG tablet Take 1 tablet (75 mcg total) by mouth daily before breakfast. 90 tablet 3  . lovastatin (MEVACOR) 40 MG tablet Take 1 tablet (40 mg total) by mouth every other day. 45 tablet 3  . metFORMIN (GLUCOPHAGE) 500 MG tablet Take 500 mg by mouth 2 (two) times daily with a meal.    . metoprolol tartrate (LOPRESSOR)  25 MG tablet Take 25 mg by mouth at bedtime.     . Multiple Vitamin (MULTIVITAMIN WITH MINERALS) TABS tablet Take 1 tablet by mouth daily.    Marland Kitchen oxyCODONE-acetaminophen (ROXICET) 5-325 MG tablet Take 0.5-1 tablets by mouth every 8 (eight) hours as needed for severe pain. 5 tablet 0  . ramipril (ALTACE) 10 MG capsule Take 1 capsule (10 mg total) by mouth daily. 90 capsule 3  . warfarin (COUMADIN) 5 MG tablet Take 5 mg by mouth every evening.    . diclofenac sodium (VOLTAREN) 1 % GEL Apply 4 g topically 4 (four) times daily. As needed for pain (Patient not taking: Reported on 12/03/2015) 400 g 5  . doxycycline (VIBRA-TABS) 100 MG tablet Take 1 tablet (100 mg total) by mouth 2 (two) times daily. (Patient not taking: Reported on 12/03/2015) 20 tablet 0  . spironolactone (ALDACTONE) 25 MG tablet Take 1 tablet (25 mg total) by mouth daily. (Patient not taking: Reported on 12/03/2015) 30 tablet 3    Results for orders placed or performed during the hospital encounter of 12/03/15 (from the past 48 hour(s))  CBC WITH DIFFERENTIAL     Status: Abnormal   Collection Time: 12/03/15  2:16 PM  Result Value Ref Range   WBC 8.9 4.0 - 10.5 K/uL   RBC 4.58 3.87 - 5.11 MIL/uL   Hemoglobin 10.8 (L) 12.0 - 15.0 g/dL   HCT 38.9 36.0 - 46.0 %   MCV 84.9 78.0 - 100.0 fL   MCH 23.6 (L) 26.0 - 34.0 pg   MCHC 27.8 (L) 30.0 - 36.0 g/dL   RDW 16.5 (H) 11.5 - 15.5 %   Platelets 318 150 - 400 K/uL   Neutrophils Relative % 66 %   Neutro Abs 6.0 1.7 - 7.7 K/uL   Lymphocytes Relative 15 %   Lymphs Abs 1.3 0.7 - 4.0 K/uL   Monocytes Relative 14 %   Monocytes Absolute 1.2 (H) 0.1 - 1.0 K/uL   Eosinophils Relative 4 %   Eosinophils Absolute 0.3 0.0 - 0.7 K/uL   Basophils Relative 1 %   Basophils Absolute 0.1 0.0 - 0.1 K/uL  Comprehensive metabolic panel     Status: Abnormal   Collection Time: 12/03/15  2:16 PM  Result Value Ref Range   Sodium 146 (H) 135 - 145 mmol/L   Potassium 3.4 (L) 3.5 - 5.1 mmol/L   Chloride 100  (L) 101 - 111 mmol/L   CO2 35 (H) 22 - 32 mmol/L   Glucose, Bld 93 65 - 99 mg/dL   BUN 19 6 - 20 mg/dL   Creatinine, Ser 0.91  0.44 - 1.00 mg/dL   Calcium 8.4 (L) 8.9 - 10.3 mg/dL   Total Protein 7.0 6.5 - 8.1 g/dL   Albumin 3.2 (L) 3.5 - 5.0 g/dL   AST 13 (L) 15 - 41 U/L   ALT 11 (L) 14 - 54 U/L   Alkaline Phosphatase 70 38 - 126 U/L   Total Bilirubin 0.6 0.3 - 1.2 mg/dL   GFR calc non Af Amer >60 >60 mL/min   GFR calc Af Amer >60 >60 mL/min    Comment: (NOTE) The eGFR has been calculated using the CKD EPI equation. This calculation has not been validated in all clinical situations. eGFR's persistently <60 mL/min signify possible Chronic Kidney Disease.    Anion gap 11 5 - 15  Brain natriuretic peptide     Status: Abnormal   Collection Time: 12/03/15  2:16 PM  Result Value Ref Range   B Natriuretic Peptide 322.7 (H) 0.0 - 100.0 pg/mL  Troponin I     Status: None   Collection Time: 12/03/15  2:16 PM  Result Value Ref Range   Troponin I <0.03 <0.031 ng/mL    Comment:        NO INDICATION OF MYOCARDIAL INJURY.   Protime-INR     Status: Abnormal   Collection Time: 12/03/15  2:30 PM  Result Value Ref Range   Prothrombin Time 24.0 (H) 11.6 - 15.2 seconds   INR 2.17 (H) 0.00 - 1.49   No results found.  Review Of Systems The patient admits to weight gain. Has vision change with need to wear reading glasses. Bilateral cataract surgery. No hearing loss. No tinnitus. Positive full upper and lower dentures. No cough, hemoptysis, asthma, COPD or pneumonia. Positive history of palpitations, chest pain and leg edema. No history of nausea, vomiting, diarrhea, constipation, bleeding from the bowels, cancer, hiatal hernia or hepatitis. Positive history of blood transfusion. No history of kidney stone, hematuria, stroke, seizures, psychiatric admissions. Positive history of joint pains.  Blood pressure 140/72, pulse 96, temperature 97.9 F (36.6 C), temperature source Oral, resp. rate 19,  SpO2 90 %.  Physical exam:  HEENT: The patient is normocephalic, atraumatic. Wears glasses. Has light  brown eyes. Bil. Lens implants. Pupils equal and reacting to light. Wears upper and lower dentures.  NECK: + JVD, no carotid bruit, full range of motion. Neck is nontender without lymphadenopathy and no thyromegaly.  LUNGS: Bilateral basal crackles. No wheezing. HEART: Normal S1, S2, with grade 2/6 systolic murmur.  ABDOMEN: Soft, nontender, distended with scars of surgery.  EXTREMITIES: 2 + edema, no cyanosis or clubbing. Spider veins bilaterally with right knee swelling and tenderness bilaterally. Small 1/2" diameter weeping ulcer right lower leg. CNS: Cranial nerves grossly intact. Bilateral equal grips. The patient is right-handed.  Skin:-Warm and dry.  Assessment/Plan Acute on chronic left heart systolic failure  Hypertension  DM, II  Obesity  Hyperlipidemia  Atrial fibrillation  Asthma, chronic Hypothyroidism CAD S/P stent  Admit IV lasix R/O MI  [facility] S, MD  12/03/2015, 4:19 PM

## 2015-12-04 ENCOUNTER — Inpatient Hospital Stay (HOSPITAL_COMMUNITY): Payer: Commercial Managed Care - HMO

## 2015-12-04 DIAGNOSIS — J690 Pneumonitis due to inhalation of food and vomit: Secondary | ICD-10-CM

## 2015-12-04 DIAGNOSIS — R579 Shock, unspecified: Secondary | ICD-10-CM | POA: Insufficient documentation

## 2015-12-04 DIAGNOSIS — G9340 Encephalopathy, unspecified: Secondary | ICD-10-CM

## 2015-12-04 DIAGNOSIS — I5023 Acute on chronic systolic (congestive) heart failure: Secondary | ICD-10-CM

## 2015-12-04 DIAGNOSIS — J9601 Acute respiratory failure with hypoxia: Secondary | ICD-10-CM

## 2015-12-04 DIAGNOSIS — J9602 Acute respiratory failure with hypercapnia: Secondary | ICD-10-CM

## 2015-12-04 LAB — GLUCOSE, CAPILLARY
GLUCOSE-CAPILLARY: 48 mg/dL — AB (ref 65–99)
GLUCOSE-CAPILLARY: 75 mg/dL (ref 65–99)
GLUCOSE-CAPILLARY: 95 mg/dL (ref 65–99)
GLUCOSE-CAPILLARY: 103 mg/dL — AB (ref 65–99)
GLUCOSE-CAPILLARY: 99 mg/dL (ref 65–99)
GLUCOSE-CAPILLARY: 153 mg/dL — AB (ref 65–99)
Glucose-Capillary: 114 mg/dL — ABNORMAL HIGH (ref 65–99)
Glucose-Capillary: 149 mg/dL — ABNORMAL HIGH (ref 65–99)

## 2015-12-04 LAB — BLOOD GAS, ARTERIAL
Acid-Base Excess: 7.6 mmol/L — ABNORMAL HIGH (ref 0.0–2.0)
Bicarbonate: 37.4 mEq/L — ABNORMAL HIGH (ref 20.0–24.0)
DRAWN BY: 33100
FIO2: 1
O2 Saturation: 97.5 %
PH ART: 7.085 — AB (ref 7.350–7.450)
Patient temperature: 98.6
TCO2: 41.4 mmol/L (ref 0–100)
pO2, Arterial: 131 mmHg — ABNORMAL HIGH (ref 80.0–100.0)

## 2015-12-04 LAB — TSH: TSH: 3.298 u[IU]/mL (ref 0.350–4.500)

## 2015-12-04 LAB — BASIC METABOLIC PANEL
Anion gap: 14 (ref 5–15)
Anion gap: 10 (ref 5–15)
BUN: 19 mg/dL (ref 6–20)
BUN: 22 mg/dL — AB (ref 6–20)
CALCIUM: 8.5 mg/dL — AB (ref 8.9–10.3)
CHLORIDE: 99 mmol/L — AB (ref 101–111)
CO2: 32 mmol/L (ref 22–32)
CO2: 34 mmol/L — AB (ref 22–32)
CREATININE: 1.08 mg/dL — AB (ref 0.44–1.00)
Calcium: 7.6 mg/dL — ABNORMAL LOW (ref 8.9–10.3)
Chloride: 97 mmol/L — ABNORMAL LOW (ref 101–111)
Creatinine, Ser: 1.24 mg/dL — ABNORMAL HIGH (ref 0.44–1.00)
GFR calc Af Amer: 48 mL/min — ABNORMAL LOW (ref >60)
GFR calc non Af Amer: 49 mL/min — ABNORMAL LOW (ref >60)
GFR calc non Af Amer: 41 mL/min — ABNORMAL LOW (ref >60)
GFR, EST AFRICAN AMERICAN: 57 mL/min — AB (ref >60)
GLUCOSE: 115 mg/dL — AB (ref 65–99)
Glucose, Bld: 89 mg/dL (ref 65–99)
POTASSIUM: 5 mmol/L (ref 3.5–5.1)
Potassium: 4.8 mmol/L (ref 3.5–5.1)
Sodium: 143 mmol/L (ref 135–145)
Sodium: 143 mmol/L (ref 135–145)

## 2015-12-04 LAB — POCT I-STAT 3, ART BLOOD GAS (G3+)
Acid-Base Excess: 9 mmol/L — ABNORMAL HIGH (ref 0.0–2.0)
Bicarbonate: 37.3 mEq/L — ABNORMAL HIGH (ref 20.0–24.0)
O2 SAT: 98 %
PCO2 ART: 70.1 mmHg — AB (ref 35.0–45.0)
PH ART: 7.334 — AB (ref 7.350–7.450)
TCO2: 39 mmol/L (ref 0–100)
pO2, Arterial: 116 mmHg — ABNORMAL HIGH (ref 80.0–100.0)

## 2015-12-04 LAB — PHOSPHORUS: PHOSPHORUS: 4.3 mg/dL (ref 2.5–4.6)

## 2015-12-04 LAB — TROPONIN I

## 2015-12-04 LAB — CARBOXYHEMOGLOBIN
Carboxyhemoglobin: 2.1 % — ABNORMAL HIGH (ref 0.5–1.5)
METHEMOGLOBIN: 0.6 % (ref 0.0–1.5)
O2 Saturation: 80.4 %
Total hemoglobin: 10.1 g/dL — ABNORMAL LOW (ref 12.0–16.0)

## 2015-12-04 LAB — HEMOGLOBIN A1C
HEMOGLOBIN A1C: 6.8 % — AB (ref 4.8–5.6)
MEAN PLASMA GLUCOSE: 148 mg/dL

## 2015-12-04 LAB — MRSA PCR SCREENING: MRSA by PCR: NEGATIVE

## 2015-12-04 LAB — PROTIME-INR
INR: 2.25 — AB (ref 0.00–1.49)
Prothrombin Time: 24.6 seconds — ABNORMAL HIGH (ref 11.6–15.2)

## 2015-12-04 LAB — LACTIC ACID, PLASMA
Lactic Acid, Venous: 1.1 mmol/L (ref 0.5–2.0)
Lactic Acid, Venous: 2.3 mmol/L (ref 0.5–2.0)

## 2015-12-04 LAB — MAGNESIUM: Magnesium: 1.4 mg/dL — ABNORMAL LOW (ref 1.7–2.4)

## 2015-12-04 MED ORDER — CHLORHEXIDINE GLUCONATE 0.12% ORAL RINSE (MEDLINE KIT)
15.0000 mL | Freq: Two times a day (BID) | OROMUCOSAL | Status: DC
  Administered 2015-12-05 – 2015-12-06 (×2): 15 mL via OROMUCOSAL

## 2015-12-04 MED ORDER — SODIUM CHLORIDE 0.9 % IV SOLN
1.0000 g | Freq: Once | INTRAVENOUS | Status: AC
  Administered 2015-12-04: 1 g via INTRAVENOUS
  Filled 2015-12-04: qty 10

## 2015-12-04 MED ORDER — FENTANYL CITRATE (PF) 100 MCG/2ML IJ SOLN
50.0000 ug | Freq: Once | INTRAMUSCULAR | Status: AC
  Administered 2015-12-04: 50 ug via INTRAVENOUS
  Filled 2015-12-04: qty 2

## 2015-12-04 MED ORDER — MIDAZOLAM HCL 2 MG/2ML IJ SOLN
1.0000 mg | INTRAMUSCULAR | Status: AC | PRN
  Administered 2015-12-04 (×3): 1 mg via INTRAVENOUS
  Filled 2015-12-04 (×2): qty 2

## 2015-12-04 MED ORDER — MAGNESIUM SULFATE 2 GM/50ML IV SOLN
2.0000 g | Freq: Once | INTRAVENOUS | Status: AC
  Administered 2015-12-04: 2 g via INTRAVENOUS
  Filled 2015-12-04: qty 50

## 2015-12-04 MED ORDER — ANTISEPTIC ORAL RINSE SOLUTION (CORINZ): 7.0000 mL | Freq: Four times a day (QID) | OROMUCOSAL | Status: DC

## 2015-12-04 MED ORDER — INSULIN ASPART 100 UNIT/ML ~~LOC~~ SOLN
0.0000 [IU] | SUBCUTANEOUS | Status: DC
  Administered 2015-12-04: 3 [IU] via SUBCUTANEOUS

## 2015-12-04 MED ORDER — DOPAMINE-DEXTROSE 3.2-5 MG/ML-% IV SOLN
INTRAVENOUS | Status: AC
  Filled 2015-12-04: qty 250

## 2015-12-04 MED ORDER — SODIUM CHLORIDE 0.9 % IJ SOLN
10.0000 mL | Freq: Two times a day (BID) | INTRAMUSCULAR | Status: DC
  Administered 2015-12-04 – 2015-12-05 (×3): 10 mL
  Administered 2015-12-06: 20 mL

## 2015-12-04 MED ORDER — FENTANYL BOLUS VIA INFUSION
25.0000 ug | INTRAVENOUS | Status: DC | PRN
  Filled 2015-12-04: qty 25

## 2015-12-04 MED ORDER — FENTANYL CITRATE (PF) 100 MCG/2ML IJ SOLN
INTRAMUSCULAR | Status: AC
  Administered 2015-12-04: 50 ug
  Filled 2015-12-04: qty 2

## 2015-12-04 MED ORDER — SODIUM CHLORIDE 0.9 % IJ SOLN: 10.0000 mL | INTRAMUSCULAR | Status: DC | PRN

## 2015-12-04 MED ORDER — FENTANYL CITRATE (PF) 100 MCG/2ML IJ SOLN
INTRAMUSCULAR | Status: AC
  Administered 2015-12-04: 100 ug
  Filled 2015-12-04: qty 2

## 2015-12-04 MED ORDER — SODIUM CHLORIDE 0.9 % IV SOLN
25.0000 ug/h | INTRAVENOUS | Status: DC
  Administered 2015-12-04: 100 ug/h via INTRAVENOUS
  Filled 2015-12-04 (×2): qty 50

## 2015-12-04 MED ORDER — ACETAZOLAMIDE SODIUM 500 MG IJ SOLR
250.0000 mg | Freq: Four times a day (QID) | INTRAMUSCULAR | Status: AC
  Administered 2015-12-05 (×2): 250 mg via INTRAVENOUS
  Filled 2015-12-04 (×5): qty 500

## 2015-12-04 MED ORDER — DEXTROSE-NACL 5-0.45 % IV SOLN
INTRAVENOUS | Status: DC
  Administered 2015-12-05 (×2): via INTRAVENOUS

## 2015-12-04 MED ORDER — CHLORHEXIDINE GLUCONATE 0.12% ORAL RINSE (MEDLINE KIT)
15.0000 mL | Freq: Two times a day (BID) | OROMUCOSAL | Status: DC
  Administered 2015-12-04: 15 mL via OROMUCOSAL

## 2015-12-04 MED ORDER — ROCURONIUM BROMIDE 50 MG/5ML IV SOLN
30.0000 mg | Freq: Once | INTRAVENOUS | Status: AC
  Administered 2015-12-04: 30 mg via INTRAVENOUS
  Filled 2015-12-04: qty 3

## 2015-12-04 MED ORDER — NOREPINEPHRINE BITARTRATE 1 MG/ML IV SOLN
2.0000 ug/min | INTRAVENOUS | Status: DC
  Administered 2015-12-04: 5 ug/min via INTRAVENOUS
  Filled 2015-12-04: qty 4

## 2015-12-04 MED ORDER — ANTISEPTIC ORAL RINSE SOLUTION (CORINZ)
7.0000 mL | OROMUCOSAL | Status: DC
  Administered 2015-12-04 – 2015-12-06 (×18): 7 mL via OROMUCOSAL

## 2015-12-04 MED ORDER — ANTISEPTIC ORAL RINSE SOLUTION (CORINZ): 7.0000 mL | OROMUCOSAL | Status: DC

## 2015-12-04 MED ORDER — PANTOPRAZOLE SODIUM 40 MG IV SOLR
40.0000 mg | Freq: Every day | INTRAVENOUS | Status: DC
  Administered 2015-12-05: 40 mg via INTRAVENOUS
  Filled 2015-12-04: qty 40

## 2015-12-04 MED ORDER — DOPAMINE-DEXTROSE 3.2-5 MG/ML-% IV SOLN: 5.0000 ug/kg/min | INTRAVENOUS | Status: DC

## 2015-12-04 MED ORDER — MIDAZOLAM HCL 2 MG/2ML IJ SOLN: 1.0000 mg | INTRAMUSCULAR | Status: DC | PRN

## 2015-12-04 MED ORDER — ETOMIDATE 2 MG/ML IV SOLN
20.0000 mg | Freq: Once | INTRAVENOUS | Status: AC
  Administered 2015-12-04: 20 mg via INTRAVENOUS

## 2015-12-04 MED ORDER — DEXTROSE 5 % IV SOLN
2.0000 g | INTRAVENOUS | Status: AC
  Administered 2015-12-04 – 2015-12-08 (×5): 2 g via INTRAVENOUS
  Filled 2015-12-04 (×7): qty 2

## 2015-12-04 MED ORDER — MIDAZOLAM HCL 2 MG/2ML IJ SOLN
INTRAMUSCULAR | Status: AC
  Administered 2015-12-04: 1 mg
  Filled 2015-12-04: qty 2

## 2015-12-04 MED ORDER — POTASSIUM CHLORIDE ER 10 MEQ PO TBCR
10.0000 meq | EXTENDED_RELEASE_TABLET | Freq: Two times a day (BID) | ORAL | Status: DC
  Administered 2015-12-04 (×2): 10 meq via ORAL
  Filled 2015-12-04 (×6): qty 1

## 2015-12-04 MED ORDER — FENTANYL 2500MCG IN NS 250ML (10MCG/ML) PREMIX INFUSION: 25.0000 ug/h | INTRAVENOUS | Status: DC

## 2015-12-04 NOTE — Care Management Note (Signed)
Case Management Note Donn Pierini RN, BSN Unit 2W-Case Manager [phone]  Patient Details  Name: Tracey [name] MRN: [id number] Date of Birth: Dec 28, 1940  Subjective/Objective:    Pt admitted with CHF                Action/Plan: PTA  Pt lived at home alone, may benefit from f/u with HH-RN for disease management- NCM to follow  Expected Discharge Date:                  Expected Discharge Plan:  Home/Self Care  In-House Referral:     Discharge planning Services  CM Consult  Post Acute Care Choice:    Choice offered to:     DME Arranged:    DME Agency:     HH Arranged:    HH Agency:     Status of Service:  In process, will continue to follow  Medicare Important Message Given:    Date Medicare IM Given:    Medicare IM give by:    Date Additional Medicare IM Given:    Additional Medicare Important Message give by:     If discussed at Long Length of Stay Meetings, dates discussed:    Additional Comments:  Darrold Span, RN 12/04/2015, 10:44 AM

## 2015-12-04 NOTE — Progress Notes (Signed)
CRITICAL VALUE ALERT  Critical value received:  Lactic Acid 2.3  Date of notification:  12/04/2015  Time of notification:  0905  Critical value read back:Yes.    Nurse who received alert:  Dorna Leitz RN   MD notified (1st page):  Pola Corn, Dr Sharol Harness  Time of first page:  0906  Responding MD:  Dr Sharol Harness  Time MD responded:  [phone]

## 2015-12-04 NOTE — Progress Notes (Signed)
Ref: Oliver Barre, MD   Subjective:  Hypotensive and hypoglycemic with feeling confused.  Objective:  Vital Signs in the last 24 hours: Temp:  [98 F (36.7 C)-99.6 F (37.6 C)] 98 F (36.7 C) (01/17 1351) Pulse Rate:  [67-106] 89 (01/17 1400) Cardiac Rhythm:  [-] Atrial fibrillation (01/17 0737) Resp:  [18-24] 24 (01/17 1351) BP: (74-127)/(50-80) 80/56 mmHg (01/17 1400) SpO2:  [2 %-99 %] 95 % (01/17 1400) Weight:  [105.507 kg (232 lb 9.6 oz)] 105.507 kg (232 lb 9.6 oz) (01/17 0351)  Physical Exam: BP Readings from Last 1 Encounters:  12/04/15 80/56    Wt Readings from Last 1 Encounters:  12/04/15 105.507 kg (232 lb 9.6 oz)    Weight change:   HEENT: [place]/AT, Eyes- PERL, EOMI, Conjunctiva-Pink, Sclera-Non-icteric Neck: No JVD, No bruit, Trachea midline. Lungs:  Clearing, Bilateral. Cardiac:  Regular rhythm, normal S1 and S2, no S3. II/Vi systolic murmur. Abdomen:  Soft, non-tender. Extremities:  1-2 + edema present. No cyanosis. No clubbing. Erythematous patch over right lower leg CNS: AxOx1, Cranial nerves grossly intact, moves all 4 extremities. Right handed. Skin: Warm and dry.   Intake/Output from previous day: 01/16 0701 - 01/17 0700 In: 480 [P.O.:480] Out: 1400 [Urine:1400]    Lab Results: BMET    Component Value Date/Time   NA 143 12/04/2015 0129   NA 146* 12/03/2015 1416   NA 140 09/28/2015 1108   K 4.8 12/04/2015 0129   K 3.4* 12/03/2015 1416   K 4.3 09/28/2015 1108   CL 97* 12/04/2015 0129   CL 100* 12/03/2015 1416   CL 101 09/28/2015 1108   CO2 32 12/04/2015 0129   CO2 35* 12/03/2015 1416   CO2 33* 09/28/2015 1108   GLUCOSE 89 12/04/2015 0129   GLUCOSE 93 12/03/2015 1416   GLUCOSE 87 09/28/2015 1108   BUN 19 12/04/2015 0129   BUN 19 12/03/2015 1416   BUN 25* 09/28/2015 1108   CREATININE 1.08* 12/04/2015 0129   CREATININE 0.91 12/03/2015 1416   CREATININE 1.00 09/28/2015 1108   CALCIUM 8.5* 12/04/2015 0129   CALCIUM 8.4* 12/03/2015 1416   CALCIUM 9.2 09/28/2015 1108   GFRNONAA 49* 12/04/2015 0129   GFRNONAA >60 12/03/2015 1416   GFRNONAA 58* 07/10/2014 0334   GFRAA 57* 12/04/2015 0129   GFRAA >60 12/03/2015 1416   GFRAA 67* 07/10/2014 0334   CBC    Component Value Date/Time   WBC 8.9 12/03/2015 1416   RBC 4.58 12/03/2015 1416   HGB 10.8* 12/03/2015 1416   HCT 38.9 12/03/2015 1416   PLT 318 12/03/2015 1416   MCV 84.9 12/03/2015 1416   MCH 23.6* 12/03/2015 1416   MCHC 27.8* 12/03/2015 1416   RDW 16.5* 12/03/2015 1416   LYMPHSABS 1.3 12/03/2015 1416   MONOABS 1.2* 12/03/2015 1416   EOSABS 0.3 12/03/2015 1416   BASOSABS 0.1 12/03/2015 1416   HEPATIC Function Panel  Recent Labs  03/30/15 1130 09/28/15 1108 12/03/15 1416  PROT 7.8 7.6 7.0   HEMOGLOBIN A1C No components found for: HGA1C,  MPG CARDIAC ENZYMES Lab Results  Component Value Date   TROPONINI <0.03 12/04/2015   TROPONINI <0.03 12/03/2015   TROPONINI <0.03 12/03/2015   BNP No results for input(s): PROBNP in the last 8760 hours. TSH  Recent Labs  03/30/15 1130 12/04/15 1107  TSH 1.51 3.298   CHOLESTEROL  Recent Labs  03/30/15 1130 09/28/15 1108  CHOL 237* 186    Scheduled Meds: . cefTRIAXone (ROCEPHIN)  IV  2 g Intravenous Q24H  .  clopidogrel  75 mg Oral Daily  . diltiazem  30 mg Oral BID  . escitalopram  20 mg Oral Daily  . insulin aspart  0-20 Units Subcutaneous TID WC  . levothyroxine  75 mcg Oral QAC breakfast  . potassium chloride  10 mEq Oral BID  . pravastatin  40 mg Oral q1800  . sodium chloride  3 mL Intravenous Q12H  . warfarin  5 mg Oral QPM  . Warfarin - Physician Dosing Inpatient   Does not apply q1800   Continuous Infusions:  PRN Meds:.sodium chloride, acetaminophen, ALPRAZolam, ondansetron (ZOFRAN) IV, oxyCODONE-acetaminophen, sodium chloride  Assessment/Plan: Acute Hypoglycemic attack R/O sepsis Acute on chronic left heart systolic failure  Hypertension  DM, II  Obesity  Hyperlipidemia  Atrial  fibrillation  Asthma, chronic Hypothyroidism CAD S/P stent  Change IV fluid to D51/2 NS. Hold antihypertensive agents Hold insulin and hypoglycemic agents. IV Rocephin   LOS: 1 day    Orpah Cobb  MD  12/04/2015, 2:15 PM

## 2015-12-04 NOTE — Procedures (Signed)
Arterial Catheter Insertion Procedure Note Tracey [name] [id number] [birth year]  Procedure: Insertion of Arterial Catheter  Indications: Blood pressure monitoring and Frequent blood sampling  Procedure Details Consent: Unable to obtain consent because of emergent medical necessity. Time Out: Verified patient identification, verified procedure, site/side was marked, verified correct patient position, special equipment/implants available, medications/allergies/relevent history reviewed, required imaging and test results available.  Performed  Maximum sterile technique was used including antiseptics, cap, gloves, gown, hand hygiene, mask and sheet. Skin prep: Chlorhexidine; local anesthetic administered 20 gauge catheter was inserted into right radial artery using the Seldinger technique.  Evaluation Blood flow good; BP tracing good. Complications: No apparent complications.   [name] 12/04/2015

## 2015-12-04 NOTE — Procedures (Signed)
Intubation Procedure Note Tracey [name] [id number] 1941/07/21  Procedure: Intubation Indications: Respiratory insufficiency  Procedure Details Consent: Unable to obtain consent because of emergent medical necessity. Time Out: Verified patient identification, verified procedure, site/side was marked, verified correct patient position, special equipment/implants available, medications/allergies/relevent history reviewed, required imaging and test results available.  Performed  Maximum sterile technique was used including gloves, hand hygiene and mask.  MAC    Evaluation Hemodynamic Status: BP stable throughout; O2 sats: stable throughout Patient's Current Condition: stable Complications: No apparent complications Patient did tolerate procedure well. Chest X-ray ordered to verify placement.  CXR: pending.   [name] 12/04/2015

## 2015-12-04 NOTE — Significant Event (Signed)
Rapid Response Event Note  Overview: Time Called: 1345 Arrival Time: 1350 Event Type: Neurologic, Hypotension  Initial Focused Assessment: Per Staff: Patient with acute change in status this afternoon.  She stood and was incontinent beside the bed then ambulated with staff to the sink where she became unresponsive to staff. BP 60s, able to stand and pivot but only with weak cough and no communication with RN.  Decreased mental status. Assisted to bed Bp improved to 80s.  AF 80s,  RR 24  O2 sat 95% on 2L [place].  Skin warm and pink afebrile   Interventions: Dr Algie Coffer notified of patient status, at bedside to assess patient. 1l ns bolus, no improvement in BP Patient active and occasionally restlestless but not interacting with staff. Placed on 100% nrb because O2 sats decreased to 69% Dopamine started at 5 mcg/kg for BP 53/23  bp improved to 89/67 ABG 7.0  Pco2 131  P02 37 bicarb 41. Transferred to 2h o2 via bed with heart monitor and O2.  Event Summary: Name of Physician Notified: Algie Coffer at 1350  Name of Consulting Physician Notified: Yacoub at 1530  Outcome: Transferred (Comment) [phone])  Event End Time: 1545  Tracey [name]

## 2015-12-04 NOTE — Progress Notes (Signed)
Utilization review completed.  

## 2015-12-04 NOTE — Consult Note (Signed)
PULMONARY / CRITICAL CARE MEDICINE   Name: Tracey [name] MRN: [id number] DOB: 1941-06-16    ADMISSION DATE:  12/03/2015 CONSULTATION DATE:  12/04/2015  REFERRING MD:  Dr. Algie Coffer  CHIEF COMPLAINT:  AMS  HISTORY OF PRESENT ILLNESS:   75 year old female past medical history as below, which includes congestive heart failure, coronary artery disease, type 2 diabetes, hypothyroidism. She is a patient of Dr. Algie Coffer and was directly admitted by him to [facility] 12/03/15 complaining of one-month history of weight gain, leg edema, shortness of breath. She had not been responding well to oral Lasix over that time period. Shortness of breath have been progressive and she was ruled out for MI, and treated with IV Lasix. The day after admission patient again had deteriorating mental status after eating lunch and vomiting. ABG was obtained and showed profound hypercarbic respiratory failure and respiratory acidosis. She was transferred to ICU for BiPAP but shortly after required intubation. PCCM has been consulted.  Of note, sons state that she lives by herself and when have talked with her recently she has seemed more out of breath but has made no other complaints. When they have seen her on the holidays it seemed as though she has "blown up" and filled with fluid. They were able to hear her wheezing.  PAST MEDICAL HISTORY :  She  has a past medical history of CHF (congestive heart failure) (HCC); Atrial fibrillation (HCC); Hypertension; Thyroid disease; Coronary artery disease; Hypothyroidism; Anxiety; Shortness of breath; Anemia; ACS (acute coronary syndrome) (HCC) (10/27/2013); Diverticulosis (10/22/2005); HIATAL HERNIA (10/22/2005); HYPERCHOLESTEROLEMIA (03/09/2008); Type II diabetes mellitus (HCC); History of blood transfusion; and Arthritis.  PAST SURGICAL HISTORY: She  has past surgical history that includes Abdominal hysterectomy; Cardiac catheterization (10/27/2013); Coronary angioplasty  with stent (10/27/2013); Cataract extraction w/ intraocular lens  implant, bilateral (Bilateral, 2014); left and right heart catheterization with coronary angiogram (N/A, 09/20/2013); percutaneous coronary stent intervention (pci-s) (N/A, 09/22/2013); percutaneous coronary stent intervention (pci-s) (N/A, 10/27/2013); Appendectomy; and Laparoscopic cholecystectomy.  Allergies  Allergen Reactions  . Tramadol Other (See Comments)    Severe confusion  . Codeine Nausea And Vomiting  . Doxycycline Itching and Rash    No current facility-administered medications on file prior to encounter.   Current Outpatient Prescriptions on File Prior to Encounter  Medication Sig  . ALPRAZolam (XANAX) 0.5 MG tablet TAKE 1 TABLET BY MOUTH TWICE A DAY AS NEEDED FOR ANXIETY (Patient taking differently: Take 0.5 mg by mouth daily at bedtime.)  . carvedilol (COREG) 6.25 MG tablet Take 1 tablet (6.25 mg total) by mouth 2 (two) times daily with a meal.  . clopidogrel (PLAVIX) 75 MG tablet Take 1 tablet (75 mg total) by mouth daily.  Marland Kitchen diltiazem (CARDIZEM) 30 MG tablet Take 30 mg by mouth 2 (two) times daily.   Marland Kitchen escitalopram (LEXAPRO) 20 MG tablet Take 1 tablet (20 mg total) by mouth daily.  . furosemide (LASIX) 40 MG tablet Take 1 tablet (40 mg total) by mouth 2 (two) times daily. (Patient taking differently: Take 40 mg by mouth 3 (three) times daily. )  . glipiZIDE (GLUCOTROL) 10 MG tablet TAKE 1 TABLET (10 MG TOTAL) BY MOUTH 2 (TWO) TIMES DAILY BEFORE A MEAL.  Marland Kitchen Insulin Glargine (LANTUS SOLOSTAR) 100 UNIT/ML Solostar Pen Inject 15 Units into the skin 2 (two) times daily.  Marland Kitchen levothyroxine (SYNTHROID, LEVOTHROID) 75 MCG tablet Take 1 tablet (75 mcg total) by mouth daily before breakfast.  . lovastatin (MEVACOR) 40 MG tablet Take 1  tablet (40 mg total) by mouth every other day.  . metFORMIN (GLUCOPHAGE) 500 MG tablet Take 500 mg by mouth 2 (two) times daily with a meal.  . metoprolol tartrate (LOPRESSOR) 25 MG tablet  Take 25 mg by mouth at bedtime.   . Multiple Vitamin (MULTIVITAMIN WITH MINERALS) TABS tablet Take 1 tablet by mouth daily.  Marland Kitchen oxyCODONE-acetaminophen (ROXICET) 5-325 MG tablet Take 0.5-1 tablets by mouth every 8 (eight) hours as needed for severe pain.  . ramipril (ALTACE) 10 MG capsule Take 1 capsule (10 mg total) by mouth daily.  Marland Kitchen warfarin (COUMADIN) 5 MG tablet Take 5 mg by mouth every evening.  . diclofenac sodium (VOLTAREN) 1 % GEL Apply 4 g topically 4 (four) times daily. As needed for pain (Patient not taking: Reported on 12/03/2015)  . doxycycline (VIBRA-TABS) 100 MG tablet Take 1 tablet (100 mg total) by mouth 2 (two) times daily. (Patient not taking: Reported on 12/03/2015)  . spironolactone (ALDACTONE) 25 MG tablet Take 1 tablet (25 mg total) by mouth daily. (Patient not taking: Reported on 12/03/2015)    FAMILY HISTORY:  Her has no family status information on file.   SOCIAL HISTORY: She  reports that she has quit smoking. Her smoking use included Cigarettes. She has a 36 pack-year smoking history. She has never used smokeless tobacco. She reports that she does not drink alcohol or use illicit drugs.  REVIEW OF SYSTEMS:   Unable  SUBJECTIVE:    VITAL SIGNS: BP 80/56 mmHg  Pulse 89  Temp(Src) 98 F (36.7 C) (Oral)  Resp 24  Wt 105.507 kg (232 lb 9.6 oz)  SpO2 95%  HEMODYNAMICS:    VENTILATOR SETTINGS:    INTAKE / OUTPUT: I/O last 3 completed shifts: In: 480 [P.O.:480] Out: 1400 [Urine:1400]  PHYSICAL EXAMINATION: General:  Obese female on NAD Neuro:  Somnolent, minimally responsive to painful stimuli HEENT:  [place]/AT, PERRL, Unable to assess JVD due to neck girth Cardiovascular:  RRR, no MRG. BLE edema. Lungs:  Diminished throughout Abdomen:  Soft, non-distended Musculoskeletal: No acute deformity Skin:  Grossly intact. RLE erythema.  LABS:  BMET  Recent Labs Lab 12/03/15 1416 12/04/15 0129 12/04/15 1516  NA 146* 143 143  K 3.4* 4.8 5.0  CL 100*  97* 99*  CO2 35* 32 34*  BUN 19 19 22*  CREATININE 0.91 1.08* 1.24*  GLUCOSE 93 89 115*    Electrolytes  Recent Labs Lab 12/03/15 1416 12/04/15 0129 12/04/15 1516  CALCIUM 8.4* 8.5* 7.6*    CBC  Recent Labs Lab 12/03/15 1416  WBC 8.9  HGB 10.8*  HCT 38.9  PLT 318    Coag's  Recent Labs Lab 12/03/15 1430 12/04/15 0129  INR 2.17* 2.25*    Sepsis Markers No results for input(s): LATICACIDVEN, PROCALCITON, O2SATVEN in the last 168 hours.  ABG  Recent Labs Lab 12/04/15 1520  PHART 7.085*  PCO2ART ABOVE REPORTABLE RANGE  PO2ART 131.0*    Liver Enzymes  Recent Labs Lab 12/03/15 1416  AST 13*  ALT 11*  ALKPHOS 70  BILITOT 0.6  ALBUMIN 3.2*    Cardiac Enzymes  Recent Labs Lab 12/03/15 1416 12/03/15 1829 12/04/15 0129  TROPONINI <0.03 <0.03 <0.03    Glucose  Recent Labs Lab 12/04/15 0609 12/04/15 1147 12/04/15 1223 12/04/15 1319 12/04/15 1353 12/04/15 1456  GLUCAP 114* 48* 75 95 103* 99    Imaging Dg Chest 2 View  12/04/2015  CLINICAL DATA:  The patient complains of shortness of breath EXAM: CHEST  2 VIEW COMPARISON:  December 19, 2014 FINDINGS: The heart size and mediastinal contours are stable. The heart size is enlarged. There is mild atelectasis of the left mid to lung base. There is no focal infiltrate, pulmonary edema, or pleural effusion. The visualized skeletal structures are stable. IMPRESSION: Cardiomegaly.  Mild atelectasis of left lung base. Electronically Signed   By: Sherian Rein M.D.   On: 12/04/2015 11:01     STUDIES:  Echo 1/17 - LVEF 45-50%, Grade 1 DD  CULTURES: BCx2 1/17 > Urine Cx 1/17 > Tracheal asp 1/17 >  ANTIBIOTICS:   SIGNIFICANT EVENTS: 1/16 > admit  LINES/TUBES: ETT 1/17 > CVL 1/17 >  DISCUSSION: 75 year old female admitted with dyspnea and weight gain treated with lasix. 1/17 was doing OK, but had episode of nausea/vomiting at lunch, and then had deteriorating MS. ABG showed profound  respiratory acidosis. She was transferred to ICU for further monitoring.  ASSESSMENT / PLAN:  PULMONARY A: Acute on chronic hypercarbic respiratory failure ?Aspiration PNA H/o asthma  P:   STAT intubation Full vent support Follow ABG CXR  CARDIOVASCULAR A:  Acute on chronic systolic/diastolic CHF Shock, etiology unclear . Cardiogenic most likely. Also consider septic.  Atrial fibrillation H/o CAD, HTN  P:  Telemetry MAP goal > 65 mm/Hg Levophed gtt to maintain MAP goal Dopamine wean to off Place CVL CVP [facility] (Dr. Algie Coffer) following Echo noted ACS ruled out via serial troponin Continue outpt pravastatin per tube. Holding outpatient diltiazem, coreg, lasix, ramipril, aldactone  RENAL A:   Acute renal failure, suspect secondary to diuresis Hypocalcemia  P:   Hold further diuresis for now Diamox  q 6 hours x 3 doses Follow BMP Replete Ca Check extended lytes  GASTROINTESTINAL A:   Nausea/vomiting  P:   OGT to LIS Zofran NPO except meds via tube  HEMATOLOGIC A:   Warfarin coagulopathy  P:  Hold warfarin Heparin gtt per pharmacy  INFECTIOUS A:   SIRS/Sepsis RLE cellulitis only identifiable source at this time. Possibly aspiration PNA  P:   Continue rocephin Follow cultures as above.  ENDOCRINE A:   Hypothyroid   DM P:   Continue synthroid per tube Hold metformin CBG monitoring and SSI  NEUROLOGIC A:   Acute metabolic encephalopathy secondary to hypercarbia, shock Depression  P:   RASS goal: -1 to -2 PRN fentanyl, versed holding outpt lexapro   FAMILY  - Updates: Sons updated at bedside by [facility]  - Inter-disciplinary family meet or Palliative Care meeting due by:  1/24  Joneen Roach, AGACNP-BC [place] [facility]/Critical Care Pager [phone] or [phone]  12/04/2015 5:04 PM  Attending Note:  75 year old female with extensive cardiac PMH who was admitted from the office with SOB and pulmonary  edema.  Patient was diuresed, subsequently developed hypercarbic respiratory failure and was transferred to the ICU.  In the ICU patient was very lethargic and was not protecting her airway.  PCCM was called on consultation.  Upon evaluating the patient decision was made to intubate her.  DDx is aspiration event resulting in hypercarbic failure or metabolic derangements from diureses resulting in elevation in CO2 and confusion.  Will intubate for airway protection.  F/U ABG and CXR as ordered.  Start rocephin for aspiration risk.  Pan cultures ordered.  Sedation orders in place.  Will insert OGT and start diet in AM unless able to extubate.  The patient is critically ill with multiple organ systems failure and requires high complexity decision making  for assessment and support, frequent evaluation and titration of therapies, application of advanced monitoring technologies and extensive interpretation of multiple databases.   Critical Care Time devoted to patient care services described in this note is  35  Minutes. This time reflects time of care of this signee Dr Jennet Maduro. This critical care time does not reflect procedure time, or teaching time or supervisory time of PA/NP/Med student/Med Resident etc but could involve care discussion time.  Rush Farmer, M.D. [facility] Pulmonary/Critical Care Medicine. Pager: [phone]. After hours pager: [phone].

## 2015-12-04 NOTE — Procedures (Signed)
Central Venous Catheter Insertion Procedure Note Tracey [name] [id number] [birth year]  Procedure: Insertion of Central Venous Catheter Indications: Frequent blood sampling  Procedure Details Consent: Risks of procedure as well as the alternatives and risks of each were explained to the (patient/caregiver).  Consent for procedure obtained. Time Out: Verified patient identification, verified procedure, site/side was marked, verified correct patient position, special equipment/implants available, medications/allergies/relevent history reviewed, required imaging and test results available.  Performed  Maximum sterile technique was used including antiseptics, cap, gloves, gown, hand hygiene, mask and sheet. Skin prep: Chlorhexidine; local anesthetic administered A antimicrobial bonded/coated triple lumen catheter was placed in the left internal jugular vein using the Seldinger technique. Ultrasound guidance used.Yes.   Catheter placed to 20 cm. Blood aspirated via all 3 ports and then flushed x 3. Line sutured x 2 and dressing applied.  Evaluation Blood flow good Complications: No apparent complications Patient did tolerate procedure well. Chest X-ray ordered to verify placement.  CXR: pending.  Joneen Roach, AGACNP-BC [place] [facility]/Critical Care Pager [phone] or [phone]  12/04/2015 5:15 PM  Alyson Reedy, M.D. [facility] Pulmonary/Critical Care Medicine. Pager: [phone]. After hours pager: [phone].

## 2015-12-05 DIAGNOSIS — L8990 Pressure ulcer of unspecified site, unspecified stage: Secondary | ICD-10-CM | POA: Insufficient documentation

## 2015-12-05 DIAGNOSIS — E119 Type 2 diabetes mellitus without complications: Secondary | ICD-10-CM

## 2015-12-05 DIAGNOSIS — R579 Shock, unspecified: Secondary | ICD-10-CM

## 2015-12-05 LAB — GLUCOSE, CAPILLARY
GLUCOSE-CAPILLARY: 66 mg/dL (ref 65–99)
Glucose-Capillary: 74 mg/dL (ref 65–99)
Glucose-Capillary: 70 mg/dL (ref 65–99)
Glucose-Capillary: 77 mg/dL (ref 65–99)
Glucose-Capillary: 117 mg/dL — ABNORMAL HIGH (ref 65–99)
Glucose-Capillary: 120 mg/dL — ABNORMAL HIGH (ref 65–99)
Glucose-Capillary: 144 mg/dL — ABNORMAL HIGH (ref 65–99)

## 2015-12-05 LAB — BASIC METABOLIC PANEL
Anion gap: 10 (ref 5–15)
BUN: 22 mg/dL — AB (ref 6–20)
CHLORIDE: 95 mmol/L — AB (ref 101–111)
CO2: 36 mmol/L — ABNORMAL HIGH (ref 22–32)
CREATININE: 1.11 mg/dL — AB (ref 0.44–1.00)
Calcium: 7.7 mg/dL — ABNORMAL LOW (ref 8.9–10.3)
GFR calc Af Amer: 55 mL/min — ABNORMAL LOW (ref >60)
GFR, EST NON AFRICAN AMERICAN: 47 mL/min — AB (ref >60)
GLUCOSE: 77 mg/dL (ref 65–99)
POTASSIUM: 3.7 mmol/L (ref 3.5–5.1)
SODIUM: 141 mmol/L (ref 135–145)

## 2015-12-05 LAB — PROTIME-INR
INR: 2.44 — ABNORMAL HIGH (ref 0.00–1.49)
PROTHROMBIN TIME: 26.2 s — AB (ref 11.6–15.2)

## 2015-12-05 MED ORDER — VITAL HIGH PROTEIN PO LIQD
1000.0000 mL | ORAL | Status: DC
  Administered 2015-12-05 – 2015-12-06 (×2): 1000 mL

## 2015-12-05 MED ORDER — WARFARIN - PHARMACIST DOSING INPATIENT
Freq: Every day | Status: DC
  Administered 2015-12-05: 18:00:00
  Administered 2015-12-07 – 2015-12-11 (×2): 1

## 2015-12-05 MED ORDER — WARFARIN SODIUM 2.5 MG PO TABS
2.5000 mg | ORAL_TABLET | Freq: Once | ORAL | Status: AC
  Administered 2015-12-05: 2.5 mg via ORAL
  Filled 2015-12-05: qty 1

## 2015-12-05 MED ORDER — DEXTROSE 50 % IV SOLN
INTRAVENOUS | Status: AC
  Administered 2015-12-05: 25 mL via INTRAVENOUS
  Filled 2015-12-05: qty 50

## 2015-12-05 MED ORDER — ACETAZOLAMIDE SODIUM 500 MG IJ SOLR
250.0000 mg | Freq: Four times a day (QID) | INTRAMUSCULAR | Status: AC
  Administered 2015-12-05 – 2015-12-06 (×3): 250 mg via INTRAVENOUS
  Filled 2015-12-05 (×3): qty 500

## 2015-12-05 MED ORDER — POTASSIUM CHLORIDE 20 MEQ/15ML (10%) PO SOLN
10.0000 meq | Freq: Two times a day (BID) | ORAL | Status: DC
  Administered 2015-12-05 – 2015-12-06 (×3): 10 meq
  Filled 2015-12-05 (×4): qty 15

## 2015-12-05 MED ORDER — PANTOPRAZOLE SODIUM 40 MG PO PACK
40.0000 mg | PACK | Freq: Every day | ORAL | Status: DC
  Administered 2015-12-06: 40 mg
  Filled 2015-12-05: qty 20

## 2015-12-05 MED ORDER — VITAL HIGH PROTEIN PO LIQD
1000.0000 mL | ORAL | Status: DC
  Administered 2015-12-05 (×2): 1000 mL

## 2015-12-05 MED ORDER — FUROSEMIDE 10 MG/ML IJ SOLN
40.0000 mg | Freq: Three times a day (TID) | INTRAMUSCULAR | Status: AC
  Administered 2015-12-05 (×2): 40 mg via INTRAVENOUS
  Filled 2015-12-05 (×2): qty 4

## 2015-12-05 MED ORDER — VITAL HIGH PROTEIN PO LIQD: 1000.0000 mL | ORAL | Status: DC

## 2015-12-05 MED ORDER — PRO-STAT SUGAR FREE PO LIQD
60.0000 mL | Freq: Two times a day (BID) | ORAL | Status: DC
  Administered 2015-12-05 – 2015-12-08 (×5): 60 mL
  Filled 2015-12-05 (×5): qty 60

## 2015-12-05 MED ORDER — POTASSIUM CHLORIDE 20 MEQ/15ML (10%) PO SOLN
40.0000 meq | Freq: Three times a day (TID) | ORAL | Status: AC
  Administered 2015-12-05 (×2): 40 meq
  Filled 2015-12-05 (×2): qty 30

## 2015-12-05 MED ORDER — DEXTROSE 50 % IV SOLN
25.0000 mL | Freq: Once | INTRAVENOUS | Status: AC
  Administered 2015-12-05: 25 mL via INTRAVENOUS

## 2015-12-05 NOTE — Progress Notes (Signed)
Initial Nutrition Assessment  DOCUMENTATION CODES:   Obesity unspecified  INTERVENTION:   Continue Vital High Protein @ 40 ml/hr  Add 60 ml Prostat BID  Tube feeding regimen provides 1360 kcal, 144 grams of protein, and 802 ml of H2O.   NUTRITION DIAGNOSIS:   Inadequate oral intake related to inability to eat as evidenced by NPO status.  GOAL:   Provide needs based on ASPEN/SCCM guidelines  MONITOR:   TF tolerance, Skin, Vent status, Labs, I & O's  REASON FOR ASSESSMENT:   Consult Enteral/tube feeding initiation and management  ASSESSMENT:   75 year old female admitted with dyspnea and weight gain treated with lasix. 1/17 was doing OK, but had episode of nausea/vomiting at lunch, and then had deteriorating MS. ABG showed profound respiratory acidosis.   Patient is currently intubated on ventilator support MV: 4.9 L/min Temp (24hrs), Avg:98.3 F (36.8 C), Min:97.9 F (36.6 C), Max:98.8 F (37.1 C)  OG tube with Vital High Protein infusing @ 40 ml/hr (960 kcal and 84 grams protein) Usual weight appears to be around 212 lb (96.1 kg) No signs of fat or muscle depletion noted on exam. Moderate edema.  Diet Order:  Diet NPO time specified  Skin:  Wound (see comment) (Pressure ulcer stage II lower right leg)  Last BM:  1/17  Height:   Ht Readings from Last 1 Encounters:  12/05/15  (1.6 m)   Weight:   Wt Readings from Last 1 Encounters:  12/05/15 236 lb 15.9 oz (107.5 kg)   Ideal Body Weight:  52.2 kg  BMI:  Body mass index is 41.99 kg/(m^2).  Estimated Nutritional Needs:   Kcal:  1610-9604  Protein:  >104 grams   Fluid:  > 1.5 L/day  EDUCATION NEEDS:   No education needs identified at this time  Kendell Bane RD, LDN, CNSC [phone] Pager [phone] After Hours Pager

## 2015-12-05 NOTE — Progress Notes (Signed)
eLink Physician-Brief Progress Note Patient Name: Tracey [name] DOB: September 08, 1941 MRN: [id number]   Date of Service  12/05/2015  HPI/Events of Note  Borderline glu 61 ojn d5  eICU Interventions  Add tube feeds repeat glu., may need d1-     Intervention Category Intermediate Interventions: Electrolyte abnormality - evaluation and management  Marice Angelino J. 12/05/2015, 12:55 AM

## 2015-12-05 NOTE — Progress Notes (Signed)
ANTICOAGULATION CONSULT NOTE - Initial Consult  Pharmacy Consult for warfarin Indication: atrial fibrillation  Allergies  Allergen Reactions  . Tramadol Other (See Comments)    Severe confusion  . Codeine Nausea And Vomiting  . Doxycycline Itching and Rash    Patient Measurements: Height:  (160 cm) Weight: 236 lb 15.9 oz (107.5 kg) IBW/kg (Calculated) : 52.4  Vital Signs: Temp: 98.8 F (37.1 C) (01/18 1126) Temp Source: Oral (01/18 1126) BP: 109/62 mmHg (01/18 1114) Pulse Rate: 79 (01/18 1400)  Labs:  Recent Labs  12/03/15 1416 12/03/15 1430 12/03/15 1829 12/04/15 0129 12/04/15 1516 12/05/15 0355  HGB 10.8*  --   --   --   --   --   HCT 38.9  --   --   --   --   --   PLT 318  --   --   --   --   --   LABPROT  --  24.0*  --  24.6*  --  26.2*  INR  --  2.17*  --  2.25*  --  2.44*  CREATININE 0.91  --   --  1.08* 1.24* 1.11*  TROPONINI <0.03  --  <0.03 <0.03  --   --     Estimated Creatinine Clearance: 51.4 mL/min (by C-G formula based on Cr of 1.11).   Medical History: Past Medical History  Diagnosis Date  . CHF (congestive heart failure) (HCC)   . Atrial fibrillation (HCC)   . Hypertension   . Thyroid disease   . Coronary artery disease   . Hypothyroidism   . Anxiety   . Shortness of breath     WITH MY CHF  . Anemia   . ACS (acute coronary syndrome) (HCC) 10/27/2013  . Diverticulosis 10/22/2005    Qualifier: Diagnosis of  By: Koleen Distance CMA (AAMA), Hulan Saas    . HIATAL HERNIA 10/22/2005    Qualifier: Diagnosis of  By: Koleen Distance CMA (AAMA), Hulan Saas    . HYPERCHOLESTEROLEMIA 03/09/2008    Qualifier: Diagnosis of  By: Koleen Distance CMA (AAMA), Hulan Saas    . Type II diabetes mellitus (HCC)   . History of blood transfusion     "related to hemorrhaging w/hysterectomy"  . Arthritis     "back; right knee" (12/03/2015)    Medications:  Prescriptions prior to admission  Medication Sig Dispense Refill Last Dose  . ALPRAZolam (XANAX) 0.5 MG tablet TAKE 1 TABLET BY MOUTH  TWICE A DAY AS NEEDED FOR ANXIETY (Patient taking differently: Take 0.5 mg by mouth daily at bedtime.) 60 tablet 1 12/02/2015 at Unknown time  . carvedilol (COREG) 6.25 MG tablet Take 1 tablet (6.25 mg total) by mouth 2 (two) times daily with a meal. 60 tablet 3 12/03/2015 at 830  . clopidogrel (PLAVIX) 75 MG tablet Take 1 tablet (75 mg total) by mouth daily. 30 tablet 3 12/03/2015 at 830  . diltiazem (CARDIZEM) 30 MG tablet Take 30 mg by mouth 2 (two) times daily.    12/03/2015 at Unknown time  . escitalopram (LEXAPRO) 20 MG tablet Take 1 tablet (20 mg total) by mouth daily. 90 tablet 3 12/03/2015 at Unknown time  . furosemide (LASIX) 40 MG tablet Take 1 tablet (40 mg total) by mouth 2 (two) times daily. (Patient taking differently: Take 40 mg by mouth 3 (three) times daily. ) 60 tablet 3 12/03/2015 at Unknown time  . glipiZIDE (GLUCOTROL) 10 MG tablet TAKE 1 TABLET (10 MG TOTAL) BY MOUTH 2 (TWO) TIMES DAILY BEFORE A MEAL.  60 tablet 10 12/03/2015 at Unknown time  . Insulin Glargine (LANTUS SOLOSTAR) 100 UNIT/ML Solostar Pen Inject 15 Units into the skin 2 (two) times daily. 15 mL 11 12/03/2015 at Unknown time  . levothyroxine (SYNTHROID, LEVOTHROID) 75 MCG tablet Take 1 tablet (75 mcg total) by mouth daily before breakfast. 90 tablet 3 12/03/2015 at Unknown time  . lovastatin (MEVACOR) 40 MG tablet Take 1 tablet (40 mg total) by mouth every other day. 45 tablet 3 12/01/2015 at pm  . metFORMIN (GLUCOPHAGE) 500 MG tablet Take 500 mg by mouth 2 (two) times daily with a meal.   12/03/2015 at Unknown time  . metoprolol tartrate (LOPRESSOR) 25 MG tablet Take 25 mg by mouth at bedtime.    12/02/2015 at 2300  . Multiple Vitamin (MULTIVITAMIN WITH MINERALS) TABS tablet Take 1 tablet by mouth daily.   12/03/2015 at Unknown time  . oxyCODONE-acetaminophen (ROXICET) 5-325 MG tablet Take 0.5-1 tablets by mouth every 8 (eight) hours as needed for severe pain. 5 tablet 0 12/02/2015 at pm  . ramipril (ALTACE) 10 MG capsule Take 1  capsule (10 mg total) by mouth daily. 90 capsule 3 12/03/2015 at Unknown time  . warfarin (COUMADIN) 5 MG tablet Take 5 mg by mouth every evening.   12/02/2015 at 2300  . diclofenac sodium (VOLTAREN) 1 % GEL Apply 4 g topically 4 (four) times daily. As needed for pain (Patient not taking: Reported on 12/03/2015) 400 g 5 Not Taking at Unknown time  . doxycycline (VIBRA-TABS) 100 MG tablet Take 1 tablet (100 mg total) by mouth 2 (two) times daily. (Patient not taking: Reported on 12/03/2015) 20 tablet 0 Completed Course at Unknown time  . spironolactone (ALDACTONE) 25 MG tablet Take 1 tablet (25 mg total) by mouth daily. (Patient not taking: Reported on 12/03/2015) 30 tablet 3 Not Taking at Unknown time    Assessment: 75 yo F w/ PMH of CHF, afib, DM, HTN, CAD presenting with CHF exacerbation. Pharmacy consulted to continue warfarin for afib.  PTA warfarin dose 5 mg daily  Hgb 10.8, Plt 318, INR 2.17 > 2.25 > 2.44  Goal of Therapy:  INR 2-3 Monitor platelets by anticoagulation protocol: Yes   Plan:  - will give 2.5 mg warfarin tonight given trend up in INR - daily PT/INR - CBC q72h - monitor for s/sx of bleeding - pt education when stable  Arcola Jansky, PharmD Clinical Pharmacy Resident Pager: [phone] 12/05/2015,3:26 PM

## 2015-12-05 NOTE — Progress Notes (Signed)
PULMONARY / CRITICAL CARE MEDICINE   Name: Tracey [name] MRN: [id number] DOB: [birth year]    ADMISSION DATE:  12/03/2015 CONSULTATION DATE:  12/04/2015  REFERRING MD:  Dr. Algie Coffer  CHIEF COMPLAINT:  AMS  HISTORY OF PRESENT ILLNESS:   75 year old female past medical history as below, which includes congestive heart failure, coronary artery disease, type 2 diabetes, hypothyroidism. She is a patient of Dr. Algie Coffer and was directly admitted by him to [facility] 12/03/15 complaining of one-month history of weight gain, leg edema, shortness of breath. She had not been responding well to oral Lasix over that time period. Shortness of breath have been progressive and she was ruled out for MI, and treated with IV Lasix. The day after admission patient again had deteriorating mental status after eating lunch and vomiting. ABG was obtained and showed profound hypercarbic respiratory failure and respiratory acidosis. She was transferred to ICU for BiPAP but shortly after required intubation. PCCM has been consulted.  Of note, sons state that she lives by herself and when have talked with her recently she has seemed more out of breath but has made no other complaints. When they have seen her on the holidays it seemed as though she has "blown up" and filled with fluid. They were able to hear her wheezing.  SUBJECTIVE: No events overnight, more alert and interactive today.  VITAL SIGNS: BP 111/69 mmHg  Pulse 76  Temp(Src) 98.6 F (37 C) (Oral)  Resp 16  Ht  (1.6 m)  Wt 107.5 kg (236 lb 15.9 oz)  BMI 41.99 kg/m2  SpO2 100%  HEMODYNAMICS: CVP:  [10 mmHg-20 mmHg] 20 mmHg  VENTILATOR SETTINGS: Vent Mode:  [-] PRVC FiO2 (%):  [50 %-60 %] 50 % Set Rate:  [6 bmp-14 bmp] 14 bmp Vt Set:  [500 mL] 500 mL PEEP:  [5 cmH20-6 cmH20] 5 cmH20 Plateau Pressure:  [28 cmH20-34 cmH20] 31 cmH20  INTAKE / OUTPUT: I/O last 3 completed shifts: In: 2638 [P.O.:480; I.V.:1892.9; NG/GT:105.2; IV  Piggyback:160] Out: 1325 [Urine:1325]  PHYSICAL EXAMINATION: General:  Obese female on NAD Neuro:  Somnolent, responsive to painful stimuli HEENT:  [place]/AT, PERRL, Unable to assess JVD due to neck girth Cardiovascular:  RRR, no MRG. BLE edema. Lungs:  Diminished throughout Abdomen:  Soft, non-distended Musculoskeletal: No acute deformity Skin:  Grossly intact. RLE erythema.  LABS:  BMET  Recent Labs Lab 12/04/15 0129 12/04/15 1516 12/05/15 0355  NA 143 143 141  K 4.8 5.0 3.7  CL 97* 99* 95*  CO2 32 34* 36*  BUN 19 22* 22*  CREATININE 1.08* 1.24* 1.11*  GLUCOSE 89 115* 77    Electrolytes  Recent Labs Lab 12/04/15 0129 12/04/15 1516 12/04/15 1857 12/05/15 0355  CALCIUM 8.5* 7.6*  --  7.7*  MG  --   --  1.4*  --   PHOS  --   --  4.3  --    CBC  Recent Labs Lab 12/03/15 1416  WBC 8.9  HGB 10.8*  HCT 38.9  PLT 318   Coag's  Recent Labs Lab 12/03/15 1430 12/04/15 0129 12/05/15 0355  INR 2.17* 2.25* 2.44*   Sepsis Markers  Recent Labs Lab 12/04/15 1516 12/04/15 1856  LATICACIDVEN 1.1 2.3*   ABG  Recent Labs Lab 12/04/15 1520 12/04/15 1701  PHART 7.085* 7.334*  PCO2ART ABOVE REPORTABLE RANGE 70.1*  PO2ART 131.0* 116.0*   Liver Enzymes  Recent Labs Lab 12/03/15 1416  AST 13*  ALT 11*  ALKPHOS 70  BILITOT 0.6  ALBUMIN 3.2*   Cardiac Enzymes  Recent Labs Lab 12/03/15 1416 12/03/15 1829 12/04/15 0129  TROPONINI <0.03 <0.03 <0.03   Glucose  Recent Labs Lab 12/04/15 1726 12/04/15 2024 12/05/15 0045 12/05/15 0229 12/05/15 0356 12/05/15 0754  GLUCAP 153* 149* 66 74 70 77   Imaging Portable Chest Xray  12/04/2015  CLINICAL DATA:  Status post intubation and central line placement today. Initial encounter. EXAM: PORTABLE CHEST 1 VIEW COMPARISON:  PA and lateral chest earlier today. FINDINGS: Endotracheal tube is in place with the tip in good position, 4.9 cm above the carina. Left IJ central venous catheter tip projects over  the mid superior vena cava. There is no pneumothorax. Cardiomegaly without edema is identified. No pleural effusion. IMPRESSION: ETT and right IJ catheter projecting good position. No pneumothorax or other new abnormality. Cardiomegaly. Electronically Signed   By: Drusilla Kanner M.D.   On: 12/04/2015 17:07   Dg Abd Portable 1v  12/04/2015  CLINICAL DATA:  Orogastric tube placement EXAM: PORTABLE ABDOMEN - 1 VIEW COMPARISON:  None. FINDINGS: Nasogastric tube with the tip projecting over the fundus of the stomach. There is no bowel dilatation to suggest obstruction. There is no evidence of pneumoperitoneum, portal venous gas or pneumatosis. There are no pathologic calcifications along the expected course of the ureters. The osseous structures are unremarkable. IMPRESSION: Nasogastric tube with the tip projecting over the fundus of the stomach. Electronically Signed   By: Elige Ko   On: 12/04/2015 17:05     STUDIES:  Echo 1/17 - LVEF 45-50%, Grade 1 DD  CULTURES: BCx2 1/17 > Urine Cx 1/17 > Tracheal asp 1/17 >  ANTIBIOTICS:   SIGNIFICANT EVENTS: 1/16 > admit  LINES/TUBES: ETT 1/17 > CVL 1/17 >  DISCUSSION: 75 year old female admitted with dyspnea and weight gain treated with lasix. 1/17 was doing OK, but had episode of nausea/vomiting at lunch, and then had deteriorating MS. ABG showed profound respiratory acidosis. She was transferred to ICU for further monitoring.  ASSESSMENT / PLAN:  PULMONARY A: Acute on chronic hypercarbic respiratory failure ?Aspiration PNA H/o asthma  P:   Begin PS trials, no extubation. Diureses as ordered. ABG in AM CXR in AM Titrate O2 for sat of 88-92%.  CARDIOVASCULAR A:  Acute on chronic systolic/diastolic CHF Shock, etiology unclear . Cardiogenic most likely. Also consider septic.  Atrial fibrillation H/o CAD, HTN  P:  Telemetry MAP goal > 65 mm/Hg Levophed gtt to maintain MAP goal D/C Dopamine. CVP 20. Co-ox noted. Cardiology  (Dr. Algie Coffer) following Echo noted ACS ruled out via serial troponin Continue outpt pravastatin per tube. Holding outpatient diltiazem, coreg, lasix, ramipril, aldactone Coumadin per pharmacy for a-fib.  RENAL A:   Acute renal failure, suspect secondary to diuresis Hypocalcemia  P:   Diamox  q 6 hours x 3 doses Lasix 40 mg IV q8 x2 doses. Follow BMP KVO IVF. Replace electrolytes as indicated.  GASTROINTESTINAL A:   Nausea/vomiting  P:   OGT to LIS Zofran TF at goal per nutrition.  HEMATOLOGIC A:   Warfarin coagulopathy  P:  Warfarin per pharmacy. SCD's. CBC in AM.  INFECTIOUS A:   SIRS/Sepsis RLE cellulitis only identifiable source at this time. Possibly aspiration PNA  P:   Continue rocephin Follow cultures as above.  ENDOCRINE A:   Hypothyroid   DM P:   Continue synthroid per tube Hold metformin CBG monitoring and SSI  NEUROLOGIC A:   Acute metabolic encephalopathy secondary to hypercarbia, shock  Depression  P:   RASS goal: -1 to -2 PRN fentanyl, versed Holding outpt lexapro  FAMILY  - Updates: No family bedside.  - Inter-disciplinary family meet or Palliative Care meeting due by:  1/24  The patient is critically ill with multiple organ systems failure and requires high complexity decision making for assessment and support, frequent evaluation and titration of therapies, application of advanced monitoring technologies and extensive interpretation of multiple databases.   Critical Care Time devoted to patient care services described in this note is  35  Minutes. This time reflects time of care of this signee Dr [name]. This critical care time does not reflect procedure time, or teaching time or supervisory time of PA/NP/Med student/Med Resident etc but could involve care discussion time.  Alyson Reedy, M.D. [facility] Pulmonary/Critical Care Medicine. Pager: [phone]. After hours pager: [phone].

## 2015-12-06 ENCOUNTER — Inpatient Hospital Stay (HOSPITAL_COMMUNITY): Payer: Commercial Managed Care - HMO

## 2015-12-06 LAB — GLUCOSE, CAPILLARY
GLUCOSE-CAPILLARY: 104 mg/dL — AB (ref 65–99)
GLUCOSE-CAPILLARY: 112 mg/dL — AB (ref 65–99)
Glucose-Capillary: 133 mg/dL — ABNORMAL HIGH (ref 65–99)
Glucose-Capillary: 145 mg/dL — ABNORMAL HIGH (ref 65–99)
Glucose-Capillary: 150 mg/dL — ABNORMAL HIGH (ref 65–99)
Glucose-Capillary: 104 mg/dL — ABNORMAL HIGH (ref 65–99)

## 2015-12-06 LAB — BLOOD GAS, ARTERIAL
ACID-BASE EXCESS: 7.1 mmol/L — AB (ref 0.0–2.0)
BICARBONATE: 32.8 meq/L — AB (ref 20.0–24.0)
DRAWN BY: 398981
FIO2: 0.5
LHR: 14 {breaths}/min
O2 SAT: 99.5 %
PCO2 ART: 62.6 mmHg — AB (ref 35.0–45.0)
PEEP: 5 cmH2O
PH ART: 7.339 — AB (ref 7.350–7.450)
Patient temperature: 98.6
TCO2: 34.7 mmol/L (ref 0–100)
VT: 500 mL
pO2, Arterial: 165 mmHg — ABNORMAL HIGH (ref 80.0–100.0)

## 2015-12-06 LAB — BASIC METABOLIC PANEL
ANION GAP: 10 (ref 5–15)
BUN: 33 mg/dL — ABNORMAL HIGH (ref 6–20)
CO2: 33 mmol/L — AB (ref 22–32)
Calcium: 7.6 mg/dL — ABNORMAL LOW (ref 8.9–10.3)
Chloride: 97 mmol/L — ABNORMAL LOW (ref 101–111)
Creatinine, Ser: 1.15 mg/dL — ABNORMAL HIGH (ref 0.44–1.00)
GFR calc non Af Amer: 45 mL/min — ABNORMAL LOW (ref >60)
GFR, EST AFRICAN AMERICAN: 53 mL/min — AB (ref >60)
GLUCOSE: 116 mg/dL — AB (ref 65–99)
POTASSIUM: 4.3 mmol/L (ref 3.5–5.1)
Sodium: 140 mmol/L (ref 135–145)

## 2015-12-06 LAB — MAGNESIUM: Magnesium: 1.9 mg/dL (ref 1.7–2.4)

## 2015-12-06 LAB — URINE CULTURE: CULTURE: NO GROWTH

## 2015-12-06 LAB — CBC
HEMATOCRIT: 33.8 % — AB (ref 36.0–46.0)
HEMOGLOBIN: 9.5 g/dL — AB (ref 12.0–15.0)
MCH: 23.8 pg — ABNORMAL LOW (ref 26.0–34.0)
MCHC: 28.1 g/dL — ABNORMAL LOW (ref 30.0–36.0)
MCV: 84.7 fL (ref 78.0–100.0)
Platelets: 269 10*3/uL (ref 150–400)
RBC: 3.99 MIL/uL (ref 3.87–5.11)
RDW: 16.6 % — ABNORMAL HIGH (ref 11.5–15.5)
WBC: 10.5 10*3/uL (ref 4.0–10.5)

## 2015-12-06 LAB — PHOSPHORUS: PHOSPHORUS: 3.5 mg/dL (ref 2.5–4.6)

## 2015-12-06 LAB — PROTIME-INR
INR: 2.79 — AB (ref 0.00–1.49)
Prothrombin Time: 29 seconds — ABNORMAL HIGH (ref 11.6–15.2)

## 2015-12-06 MED ORDER — PANTOPRAZOLE SODIUM 40 MG PO TBEC
40.0000 mg | DELAYED_RELEASE_TABLET | Freq: Every day | ORAL | Status: DC
  Administered 2015-12-07 – 2015-12-12 (×6): 40 mg via ORAL
  Filled 2015-12-06 (×6): qty 1

## 2015-12-06 MED ORDER — FUROSEMIDE 10 MG/ML IJ SOLN
40.0000 mg | Freq: Three times a day (TID) | INTRAMUSCULAR | Status: AC
  Administered 2015-12-06 (×2): 40 mg via INTRAVENOUS
  Filled 2015-12-06 (×2): qty 4

## 2015-12-06 MED ORDER — LEVOTHYROXINE SODIUM 100 MCG PO TABS
100.0000 ug | ORAL_TABLET | Freq: Every day | ORAL | Status: DC
  Administered 2015-12-07 – 2015-12-12 (×6): 100 ug via ORAL
  Filled 2015-12-06 (×6): qty 1

## 2015-12-06 MED ORDER — WARFARIN SODIUM 2 MG PO TABS
1.0000 mg | ORAL_TABLET | Freq: Once | ORAL | Status: AC
  Administered 2015-12-06: 1 mg via ORAL
  Filled 2015-12-06: qty 0.5

## 2015-12-06 MED ORDER — CETYLPYRIDINIUM CHLORIDE 0.05 % MT LIQD
7.0000 mL | Freq: Two times a day (BID) | OROMUCOSAL | Status: DC
  Administered 2015-12-06 – 2015-12-12 (×13): 7 mL via OROMUCOSAL

## 2015-12-06 MED ORDER — ACETAZOLAMIDE SODIUM 500 MG IJ SOLR
250.0000 mg | Freq: Four times a day (QID) | INTRAMUSCULAR | Status: AC
  Administered 2015-12-06 (×3): 250 mg via INTRAVENOUS
  Filled 2015-12-06 (×3): qty 500

## 2015-12-06 MED ORDER — INSULIN ASPART 100 UNIT/ML ~~LOC~~ SOLN
1.0000 [IU] | SUBCUTANEOUS | Status: DC
  Administered 2015-12-06 – 2015-12-07 (×4): 1 [IU] via SUBCUTANEOUS
  Administered 2015-12-07: 2 [IU] via SUBCUTANEOUS
  Administered 2015-12-07: 1 [IU] via SUBCUTANEOUS

## 2015-12-06 MED ORDER — POTASSIUM CHLORIDE CRYS ER 10 MEQ PO TBCR
10.0000 meq | EXTENDED_RELEASE_TABLET | Freq: Two times a day (BID) | ORAL | Status: DC
  Filled 2015-12-06: qty 1

## 2015-12-06 MED ORDER — MAGNESIUM SULFATE 2 GM/50ML IV SOLN
2.0000 g | Freq: Once | INTRAVENOUS | Status: AC
  Administered 2015-12-06: 2 g via INTRAVENOUS
  Filled 2015-12-06: qty 50

## 2015-12-06 NOTE — Procedures (Signed)
Extubation Procedure Note  Patient Details:   Name: EMMELIA HOLDSWORTH DOB: [birth year] MRN: [id number]   Airway Documentation:     Evaluation  O2 sats: stable throughout Complications: No apparent complications Patient did tolerate procedure well. Bilateral Breath Sounds: Clear, Diminished Suctioning: Oral, Airway Yes   Positive cuff leak noted prior to extubation. Pt placed on [place] 3 Lpm with humidity. Sat 98%.  Forest Becker Mylen Mangan 12/06/2015, 10:45 AM

## 2015-12-06 NOTE — Plan of Care (Signed)
Problem: Phase II Progression Outcomes Goal: Time pt extubated/weaned off vent Outcome: Completed/Met Date Met:  12/06/15 1035am

## 2015-12-06 NOTE — Consult Note (Signed)
WOC wound consult note Reason for Consult: RLE wound Patient reports some LE edema over the last few weeks. She confirms my suspicion of a blister on this leg and when she had it she did see her primary care MD who prescribed oral antibiotics.  She did take all of the PO antibiotics Wound type: ruptured bulla, non healing wound present x 2 weeks Pressure Ulcer POA: No Measurement:2.0cm x 1.50mc x 0.2cm  Wound bed: pink, clean, moist Drainage (amount, consistency, odor) minimal, serous Periwound: intact, edema Dressing procedure/placement/frequency: Continue soft silicone foam that has been implemented per the skin care order set. No other topical care needed at this time.  Follow up with primary care MD if s/s of infection occur after DC to home.   Discussed POC with patient and bedside nurse.  Re consult if needed, will not follow at this time. Thanks  Mishon Blubaugh [employer], CWOCN [phone])

## 2015-12-06 NOTE — Progress Notes (Signed)
ANTICOAGULATION CONSULT NOTE - Follow-Up Consult  Pharmacy Consult for warfarin Indication: atrial fibrillation  Allergies  Allergen Reactions  . Tramadol Other (See Comments)    Severe confusion  . Codeine Nausea And Vomiting  . Doxycycline Itching and Rash    Patient Measurements: Height:  (160 cm) Weight: 239 lb 10.2 oz (108.7 kg) IBW/kg (Calculated) : 52.4  Vital Signs: Temp: 98.8 F (37.1 C) (01/19 0400) Temp Source: Oral (01/19 0400) BP: 105/50 mmHg (01/19 0745) Pulse Rate: 97 (01/19 0745)  Labs:  Recent Labs  12/03/15 1416  12/03/15 1829 12/04/15 0129 12/04/15 1516 12/05/15 0355 12/06/15 0430  HGB 10.8*  --   --   --   --   --  9.5*  HCT 38.9  --   --   --   --   --  33.8*  PLT 318  --   --   --   --   --  269  LABPROT  --   < >  --  24.6*  --  26.2* 29.0*  INR  --   < >  --  2.25*  --  2.44* 2.79*  CREATININE 0.91  --   --  1.08* 1.24* 1.11* 1.15*  TROPONINI <0.03  --  <0.03 <0.03  --   --   --   < > = values in this interval not displayed.  Estimated Creatinine Clearance: 50 mL/min (by C-G formula based on Cr of 1.15).   Medical History: Past Medical History  Diagnosis Date  . CHF (congestive heart failure) (HCC)   . Atrial fibrillation (HCC)   . Hypertension   . Thyroid disease   . Coronary artery disease   . Hypothyroidism   . Anxiety   . Shortness of breath     WITH MY CHF  . Anemia   . ACS (acute coronary syndrome) (HCC) 10/27/2013  . Diverticulosis 10/22/2005    Qualifier: Diagnosis of  By: Koleen Distance CMA (AAMA), Hulan Saas    . HIATAL HERNIA 10/22/2005    Qualifier: Diagnosis of  By: Koleen Distance CMA (AAMA), Hulan Saas    . HYPERCHOLESTEROLEMIA 03/09/2008    Qualifier: Diagnosis of  By: Koleen Distance CMA (AAMA), Hulan Saas    . Type II diabetes mellitus (HCC)   . History of blood transfusion     "related to hemorrhaging w/hysterectomy"  . Arthritis     "back; right knee" (12/03/2015)    Medications:  Prescriptions prior to admission  Medication Sig  Dispense Refill Last Dose  . ALPRAZolam (XANAX) 0.5 MG tablet TAKE 1 TABLET BY MOUTH TWICE A DAY AS NEEDED FOR ANXIETY (Patient taking differently: Take 0.5 mg by mouth daily at bedtime.) 60 tablet 1 12/02/2015 at Unknown time  . carvedilol (COREG) 6.25 MG tablet Take 1 tablet (6.25 mg total) by mouth 2 (two) times daily with a meal. 60 tablet 3 12/03/2015 at 830  . clopidogrel (PLAVIX) 75 MG tablet Take 1 tablet (75 mg total) by mouth daily. 30 tablet 3 12/03/2015 at 830  . diltiazem (CARDIZEM) 30 MG tablet Take 30 mg by mouth 2 (two) times daily.    12/03/2015 at Unknown time  . escitalopram (LEXAPRO) 20 MG tablet Take 1 tablet (20 mg total) by mouth daily. 90 tablet 3 12/03/2015 at Unknown time  . furosemide (LASIX) 40 MG tablet Take 1 tablet (40 mg total) by mouth 2 (two) times daily. (Patient taking differently: Take 40 mg by mouth 3 (three) times daily. ) 60 tablet 3 12/03/2015 at Unknown time  .  glipiZIDE (GLUCOTROL) 10 MG tablet TAKE 1 TABLET (10 MG TOTAL) BY MOUTH 2 (TWO) TIMES DAILY BEFORE A MEAL. 60 tablet 10 12/03/2015 at Unknown time  . Insulin Glargine (LANTUS SOLOSTAR) 100 UNIT/ML Solostar Pen Inject 15 Units into the skin 2 (two) times daily. 15 mL 11 12/03/2015 at Unknown time  . levothyroxine (SYNTHROID, LEVOTHROID) 75 MCG tablet Take 1 tablet (75 mcg total) by mouth daily before breakfast. 90 tablet 3 12/03/2015 at Unknown time  . lovastatin (MEVACOR) 40 MG tablet Take 1 tablet (40 mg total) by mouth every other day. 45 tablet 3 12/01/2015 at pm  . metFORMIN (GLUCOPHAGE) 500 MG tablet Take 500 mg by mouth 2 (two) times daily with a meal.   12/03/2015 at Unknown time  . metoprolol tartrate (LOPRESSOR) 25 MG tablet Take 25 mg by mouth at bedtime.    12/02/2015 at 2300  . Multiple Vitamin (MULTIVITAMIN WITH MINERALS) TABS tablet Take 1 tablet by mouth daily.   12/03/2015 at Unknown time  . oxyCODONE-acetaminophen (ROXICET) 5-325 MG tablet Take 0.5-1 tablets by mouth every 8 (eight) hours as needed  for severe pain. 5 tablet 0 12/02/2015 at pm  . ramipril (ALTACE) 10 MG capsule Take 1 capsule (10 mg total) by mouth daily. 90 capsule 3 12/03/2015 at Unknown time  . warfarin (COUMADIN) 5 MG tablet Take 5 mg by mouth every evening.   12/02/2015 at 2300  . diclofenac sodium (VOLTAREN) 1 % GEL Apply 4 g topically 4 (four) times daily. As needed for pain (Patient not taking: Reported on 12/03/2015) 400 g 5 Not Taking at Unknown time  . doxycycline (VIBRA-TABS) 100 MG tablet Take 1 tablet (100 mg total) by mouth 2 (two) times daily. (Patient not taking: Reported on 12/03/2015) 20 tablet 0 Completed Course at Unknown time  . spironolactone (ALDACTONE) 25 MG tablet Take 1 tablet (25 mg total) by mouth daily. (Patient not taking: Reported on 12/03/2015) 30 tablet 3 Not Taking at Unknown time    Assessment: 75 yo F w/ PMH of CHF, afib, DM, HTN, CAD presenting with CHF exacerbation. Pharmacy consulted to continue warfarin for afib.  PTA warfarin dose 5 mg daily  Hgb 10.8, Plt 318, INR 2.17 > 2.25 > 2.44>2.79  Goal of Therapy:  INR 2-3 Monitor platelets by anticoagulation protocol: Yes   Plan:  - Will decrease to 1 mg warfarin tonight given trend up in INR - daily PT/INR - CBC q72h - monitor for s/sx of bleeding  Remi Haggard, PharmD Clinical Pharmacist- Resident Pager: [phone]   12/06/2015,10:27 AM

## 2015-12-06 NOTE — Progress Notes (Signed)
Ref: Oliver Barre, MD   Subjective:  Awake and extubated. T max 100.2 degree F.  Objective:  Vital Signs in the last 24 hours: Temp:  [98.4 F (36.9 C)-100.2 F (37.9 C)] 98.9 F (37.2 C) (01/19 1200) Pulse Rate:  [66-103] 102 (01/19 1400) Cardiac Rhythm:  [-] Atrial fibrillation (01/19 1200) Resp:  [12-24] 18 (01/19 1400) BP: (82-154)/(45-132) 89/45 mmHg (01/19 1200) SpO2:  [93 %-100 %] 99 % (01/19 1400) FiO2 (%):  [2 %-50 %] 2 % (01/19 1050) Weight:  [108.7 kg (239 lb 10.2 oz)] 108.7 kg (239 lb 10.2 oz) (01/19 0500)  Physical Exam: BP Readings from Last 1 Encounters:  12/06/15 89/45    Wt Readings from Last 1 Encounters:  12/06/15 108.7 kg (239 lb 10.2 oz)    Weight change: 1.2 kg (2 lb 10.3 oz)  HEENT: [place]/AT, Eyes-Blue, PERL, EOMI, Conjunctiva-Pink, Sclera-Non-icteric Neck: No JVD, No bruit, Trachea midline. Lungs:  Clear, Bilateral. Cardiac:  Regular rhythm, normal S1 and S2, no S3. II/VI systolic murmur. Abdomen:  Soft, non-tender. Extremities:  1 + edema present. No cyanosis. No clubbing. 2 cm x 1.5 cm healing wound RLE. CNS: AxOx2, Cranial nerves grossly intact, moves all 4 extremities. Right handed. Skin: Warm and dry.   Intake/Output from previous day: 01/18 0701 - 01/19 0700 In: 2211.7 [I.V.:637.2; ZO/XW:9604.5; IV Piggyback:50] Out: 2360 [Urine:2360]    Lab Results: BMET    Component Value Date/Time   NA 140 12/06/2015 0430   NA 141 12/05/2015 0355   NA 143 12/04/2015 1516   K 4.3 12/06/2015 0430   K 3.7 12/05/2015 0355   K 5.0 12/04/2015 1516   CL 97* 12/06/2015 0430   CL 95* 12/05/2015 0355   CL 99* 12/04/2015 1516   CO2 33* 12/06/2015 0430   CO2 36* 12/05/2015 0355   CO2 34* 12/04/2015 1516   GLUCOSE 116* 12/06/2015 0430   GLUCOSE 77 12/05/2015 0355   GLUCOSE 115* 12/04/2015 1516   BUN 33* 12/06/2015 0430   BUN 22* 12/05/2015 0355   BUN 22* 12/04/2015 1516   CREATININE 1.15* 12/06/2015 0430   CREATININE 1.11* 12/05/2015 0355   CREATININE  1.24* 12/04/2015 1516   CALCIUM 7.6* 12/06/2015 0430   CALCIUM 7.7* 12/05/2015 0355   CALCIUM 7.6* 12/04/2015 1516   GFRNONAA 45* 12/06/2015 0430   GFRNONAA 47* 12/05/2015 0355   GFRNONAA 41* 12/04/2015 1516   GFRAA 53* 12/06/2015 0430   GFRAA 55* 12/05/2015 0355   GFRAA 48* 12/04/2015 1516   CBC    Component Value Date/Time   WBC 10.5 12/06/2015 0430   RBC 3.99 12/06/2015 0430   HGB 9.5* 12/06/2015 0430   HCT 33.8* 12/06/2015 0430   PLT 269 12/06/2015 0430   MCV 84.7 12/06/2015 0430   MCH 23.8* 12/06/2015 0430   MCHC 28.1* 12/06/2015 0430   RDW 16.6* 12/06/2015 0430   LYMPHSABS 1.3 12/03/2015 1416   MONOABS 1.2* 12/03/2015 1416   EOSABS 0.3 12/03/2015 1416   BASOSABS 0.1 12/03/2015 1416   HEPATIC Function Panel  Recent Labs  03/30/15 1130 09/28/15 1108 12/03/15 1416  PROT 7.8 7.6 7.0   HEMOGLOBIN A1C No components found for: HGA1C,  MPG CARDIAC ENZYMES Lab Results  Component Value Date   TROPONINI <0.03 12/04/2015   TROPONINI <0.03 12/03/2015   TROPONINI <0.03 12/03/2015   BNP No results for input(s): PROBNP in the last 8760 hours. TSH  Recent Labs  03/30/15 1130 12/04/15 1107  TSH 1.51 3.298   CHOLESTEROL  Recent Labs  03/30/15 1130 09/28/15 1108  CHOL 237* 186    Scheduled Meds: . acetaZOLAMIDE  250 mg Intravenous Q6H  . antiseptic oral rinse  7 mL Mouth Rinse BID  . cefTRIAXone (ROCEPHIN)  IV  2 g Intravenous Q24H  . clopidogrel  75 mg Oral Daily  . feeding supplement (PRO-STAT SUGAR FREE 64)  60 mL Per Tube BID  . furosemide  40 mg Intravenous Q8H  . insulin aspart  1-3 Units Subcutaneous 6 times per day  . [START ON 12/07/2015] levothyroxine  100 mcg Oral QAC breakfast  . pantoprazole sodium  40 mg Per Tube Daily  . potassium chloride  10 mEq Per Tube BID  . pravastatin  40 mg Oral q1800  . warfarin  1 mg Oral ONCE-1800  . Warfarin - Pharmacist Dosing Inpatient   Does not apply q1800   Continuous Infusions: . feeding supplement  (VITAL HIGH PROTEIN) Stopped (12/06/15 1000)  . fentaNYL infusion INTRAVENOUS Stopped (12/05/15 0745)  . norepinephrine (LEVOPHED) Adult infusion Stopped (12/05/15 1000)   PRN Meds:.acetaminophen, fentaNYL, midazolam, ondansetron (ZOFRAN) IV, sodium chloride, sodium chloride  Assessment/Plan: Right lung pneumonia-possible aspiration Acute on chronic hypercarbic respiratory failure Acute Hypoglycemic attack R/O sepsis Acute on chronic left heart systolic failure Acute metabolic encephalopathy secondary to hypercarbia and shock  Acute renal failure Hypertension  DM, II  Obesity  Hyperlipidemia  Atrial fibrillation  Asthma, chronic Hypothyroidism CAD S/P stent  Follow with CCM.     LOS: 3 days    Orpah Cobb  MD  12/06/2015, 2:41 PM

## 2015-12-06 NOTE — Progress Notes (Signed)
PULMONARY / CRITICAL CARE MEDICINE   Name: Tracey [name] MRN: [id number] DOB: 1940-12-27    ADMISSION DATE:  12/03/2015 CONSULTATION DATE:  12/04/2015  REFERRING MD:  Dr. Algie Coffer  CHIEF COMPLAINT:  AMS  HISTORY OF PRESENT ILLNESS:   75 year old female past medical history as below, which includes congestive heart failure, coronary artery disease, type 2 diabetes, hypothyroidism. She is a patient of Dr. Algie Coffer and was directly admitted by him to [facility] 12/03/15 complaining of one-month history of weight gain, leg edema, shortness of breath. She had not been responding well to oral Lasix over that time period. Shortness of breath have been progressive and she was ruled out for MI, and treated with IV Lasix. The day after admission patient again had deteriorating mental status after eating lunch and vomiting. ABG was obtained and showed profound hypercarbic respiratory failure and respiratory acidosis. She was transferred to ICU for BiPAP but shortly after required intubation. PCCM has been consulted.  Of note, sons state that she lives by herself and when have talked with her recently she has seemed more out of breath but has made no other complaints. When they have seen her on the holidays it seemed as though she has "blown up" and filled with fluid. They were able to hear her wheezing.  SUBJECTIVE: No events overnight, more alert and interactive today.  VITAL SIGNS: BP 105/50 mmHg  Pulse 97  Temp(Src) 98.8 F (37.1 C) (Oral)  Resp 14  Ht  (1.6 m)  Wt 108.7 kg (239 lb 10.2 oz)  BMI 42.46 kg/m2  SpO2 100%  HEMODYNAMICS: CVP:  [12 mmHg-21 mmHg] 12 mmHg  VENTILATOR SETTINGS: Vent Mode:  [-] PSV;CPAP FiO2 (%):  [40 %-50 %] 40 % Set Rate:  [14 bmp] 14 bmp Vt Set:  [500 mL] 500 mL PEEP:  [5 cmH20] 5 cmH20 Pressure Support:  [8 cmH20-10 cmH20] 8 cmH20 Plateau Pressure:  [20 cmH20-23 cmH20] 22 cmH20  INTAKE / OUTPUT: I/O last 3 completed shifts: In: 3889.1  [I.V.:2049.5; Other:60; WU/JW:1191.4; IV Piggyback:210] Out: 3085 [Urine:3085]  PHYSICAL EXAMINATION: General:  Obese female on NAD Neuro:  Somnolent, responsive to painful stimuli HEENT:  [place]/AT, PERRL, Unable to assess JVD due to neck girth Cardiovascular:  RRR, no MRG. BLE edema. Lungs:  Diminished throughout Abdomen:  Soft, non-distended Musculoskeletal: No acute deformity Skin:  Grossly intact. RLE erythema.  LABS:  BMET  Recent Labs Lab 12/04/15 1516 12/05/15 0355 12/06/15 0430  NA 143 141 140  K 5.0 3.7 4.3  CL 99* 95* 97*  CO2 34* 36* 33*  BUN 22* 22* 33*  CREATININE 1.24* 1.11* 1.15*  GLUCOSE 115* 77 116*    Electrolytes  Recent Labs Lab 12/04/15 1516 12/04/15 1857 12/05/15 0355 12/06/15 0430  CALCIUM 7.6*  --  7.7* 7.6*  MG  --  1.4*  --  1.9  PHOS  --  4.3  --  3.5   CBC  Recent Labs Lab 12/03/15 1416 12/06/15 0430  WBC 8.9 10.5  HGB 10.8* 9.5*  HCT 38.9 33.8*  PLT 318 269   Coag's  Recent Labs Lab 12/04/15 0129 12/05/15 0355 12/06/15 0430  INR 2.25* 2.44* 2.79*   Sepsis Markers  Recent Labs Lab 12/04/15 1516 12/04/15 1856  LATICACIDVEN 1.1 2.3*   ABG  Recent Labs Lab 12/04/15 1520 12/04/15 1701 12/06/15 0335  PHART 7.085* 7.334* 7.339*  PCO2ART ABOVE REPORTABLE RANGE 70.1* 62.6*  PO2ART 131.0* 116.0* 165*   Liver Enzymes  Recent Labs  Lab 12/03/15 1416  AST 13*  ALT 11*  ALKPHOS 70  BILITOT 0.6  ALBUMIN 3.2*   Cardiac Enzymes  Recent Labs Lab 12/03/15 1416 12/03/15 1829 12/04/15 0129  TROPONINI <0.03 <0.03 <0.03   Glucose  Recent Labs Lab 12/05/15 0754 12/05/15 1129 12/05/15 1614 12/05/15 2030 12/06/15 0003 12/06/15 0428  GLUCAP 77 117* 120* 144* 112* 104*   Imaging Dg Chest Port 1 View  12/06/2015  CLINICAL DATA:  Hypoxia EXAM: PORTABLE CHEST 1 VIEW COMPARISON:  December 04, 2015 FINDINGS: Endotracheal tube tip is 3.6 cm above the carina. Central catheter tip is in the superior vena cava.  Nasogastric tube tip and side port below the diaphragm. No pneumothorax. There is subtle patchy infiltrate in the right base, new. Lungs elsewhere clear. Heart is enlarged with pulmonary vascularity within normal limits, stable. No adenopathy. IMPRESSION: Subtle infiltrate right base. Lungs elsewhere clear. Tube and catheter positions as described without pneumothorax. Stable cardiomegaly. Electronically Signed   By: Bretta Bang III M.D.   On: 12/06/2015 07:26     STUDIES:  Echo 1/17 - LVEF 45-50%, Grade 1 DD  CULTURES: BCx2 1/17 > Urine Cx 1/17 > Tracheal asp 1/17 >  ANTIBIOTICS:   SIGNIFICANT EVENTS: 1/16 > admit  LINES/TUBES: ETT 1/17 > CVL 1/17 >  DISCUSSION: 75 year old female admitted with dyspnea and weight gain treated with lasix. 1/17 was doing OK, but had episode of nausea/vomiting at lunch, and then had deteriorating MS. ABG showed profound respiratory acidosis. She was transferred to ICU for further monitoring.  ASSESSMENT / PLAN:  PULMONARY A: Acute on chronic hypercarbic respiratory failure ?Aspiration PNA H/o asthma  P:   SBT to extubate today. Titrate O2 for sat of 88-92%. Diureses as ordered. IS per RT protocol. Ambulate.  CARDIOVASCULAR A:  Acute on chronic systolic/diastolic CHF Shock, etiology unclear . Cardiogenic most likely. Also consider septic.  Atrial fibrillation H/o CAD, HTN  P:  Telemetry MAP goal > 65 mm/Hg D/C levophed. D/C Dopamine. Cardiology (Dr. Algie Coffer) following Echo noted ACS ruled out via serial troponin Continue outpt pravastatin per tube. Holding outpatient diltiazem, coreg, lasix, ramipril, aldactone, will defer to cards as to when to start. Coumadin per pharmacy for a-fib.  RENAL A:   Acute renal failure, suspect secondary to diuresis Hypocalcemia  P:   Diamox  q 6 hours x 3 doses Lasix 40 mg IV q8 x2 doses. Follow BMP KVO IVF. Replace electrolytes as indicated.  GASTROINTESTINAL A:    Nausea/vomiting  P:   Swallow evaluation.  HEMATOLOGIC A:   Warfarin coagulopathy  P:  Warfarin per pharmacy. SCD's. CBC in AM.  INFECTIOUS A:   SIRS/Sepsis RLE cellulitis only identifiable source at this time. Possibly aspiration PNA  P:   Continue rocephin Follow cultures as above.  ENDOCRINE A:   Hypothyroid   DM P:   Continue synthroid per tube Hold metformin CBG monitoring and SSI  NEUROLOGIC A:   Acute metabolic encephalopathy secondary to hypercarbia, shock Depression  P:   RASS goal: -1 to -2 PRN fentanyl, versed Holding outpt lexapro  FAMILY  - Updates: No family bedside.  - Inter-disciplinary family meet or Palliative Care meeting due by:  1/24  The patient is critically ill with multiple organ systems failure and requires high complexity decision making for assessment and support, frequent evaluation and titration of therapies, application of advanced monitoring technologies and extensive interpretation of multiple databases.   Critical Care Time devoted to patient care services described in this  note is  35  Minutes. This time reflects time of care of this signee Dr Jennet Maduro. This critical care time does not reflect procedure time, or teaching time or supervisory time of PA/NP/Med student/Med Resident etc but could involve care discussion time.  Rush Farmer, M.D. [facility] Pulmonary/Critical Care Medicine. Pager: [phone]. After hours pager: [phone].

## 2015-12-06 NOTE — Progress Notes (Signed)
RT instructed pt on proper use of Incentive spirometer.  Pt was able to demonstrate back getting 400 VT with coaching. RN notified.

## 2015-12-06 NOTE — Progress Notes (Signed)
Found vent set at 50%. Titrated to 40%

## 2015-12-06 NOTE — Progress Notes (Signed)
eLink Physician-Brief Progress Note Patient Name: Tracey [name] DOB: June 26, 1941 MRN: [id number]   Date of Service  12/06/2015  HPI/Events of Note  Patient extubated today and is waiting for swallow study. Has had ice chips and sips of Apple juice and has done well with those.   eICU Interventions  Will modify NPO to NPO except sips and chips.      Intervention Category Minor Interventions: Routine modifications to care plan (e.g. PRN medications for pain, fever);Communication with other healthcare providers and/or family  Lenell Antu 12/06/2015, 4:22 PM

## 2015-12-07 LAB — GLUCOSE, CAPILLARY
GLUCOSE-CAPILLARY: 153 mg/dL — AB (ref 65–99)
GLUCOSE-CAPILLARY: 142 mg/dL — AB (ref 65–99)
GLUCOSE-CAPILLARY: 169 mg/dL — AB (ref 65–99)
Glucose-Capillary: 106 mg/dL — ABNORMAL HIGH (ref 65–99)
Glucose-Capillary: 122 mg/dL — ABNORMAL HIGH (ref 65–99)
Glucose-Capillary: 108 mg/dL — ABNORMAL HIGH (ref 65–99)

## 2015-12-07 LAB — CBC
HCT: 36.2 % (ref 36.0–46.0)
HEMOGLOBIN: 9.7 g/dL — AB (ref 12.0–15.0)
MCH: 23.4 pg — ABNORMAL LOW (ref 26.0–34.0)
MCHC: 26.8 g/dL — ABNORMAL LOW (ref 30.0–36.0)
MCV: 87.2 fL (ref 78.0–100.0)
Platelets: 269 10*3/uL (ref 150–400)
RBC: 4.15 MIL/uL (ref 3.87–5.11)
RDW: 16.5 % — ABNORMAL HIGH (ref 11.5–15.5)
WBC: 10.3 10*3/uL (ref 4.0–10.5)

## 2015-12-07 LAB — BASIC METABOLIC PANEL
Anion gap: 11 (ref 5–15)
BUN: 30 mg/dL — ABNORMAL HIGH (ref 6–20)
CHLORIDE: 97 mmol/L — AB (ref 101–111)
CO2: 33 mmol/L — ABNORMAL HIGH (ref 22–32)
Calcium: 8.1 mg/dL — ABNORMAL LOW (ref 8.9–10.3)
Creatinine, Ser: 0.94 mg/dL (ref 0.44–1.00)
GFR calc non Af Amer: 58 mL/min — ABNORMAL LOW (ref >60)
Glucose, Bld: 112 mg/dL — ABNORMAL HIGH (ref 65–99)
POTASSIUM: 3.6 mmol/L (ref 3.5–5.1)
SODIUM: 141 mmol/L (ref 135–145)

## 2015-12-07 LAB — PHOSPHORUS: PHOSPHORUS: 3.6 mg/dL (ref 2.5–4.6)

## 2015-12-07 LAB — CULTURE, RESPIRATORY W GRAM STAIN: Culture: NORMAL

## 2015-12-07 LAB — MAGNESIUM: MAGNESIUM: 2 mg/dL (ref 1.7–2.4)

## 2015-12-07 LAB — CULTURE, RESPIRATORY

## 2015-12-07 LAB — PROTIME-INR
INR: 2.02 — AB (ref 0.00–1.49)
PROTHROMBIN TIME: 22.8 s — AB (ref 11.6–15.2)

## 2015-12-07 MED ORDER — ESCITALOPRAM OXALATE 10 MG PO TABS
20.0000 mg | ORAL_TABLET | Freq: Every day | ORAL | Status: DC
  Administered 2015-12-07 – 2015-12-11 (×5): 20 mg via ORAL
  Filled 2015-12-07 (×5): qty 2

## 2015-12-07 MED ORDER — FUROSEMIDE 10 MG/ML IJ SOLN
40.0000 mg | Freq: Three times a day (TID) | INTRAMUSCULAR | Status: AC
  Administered 2015-12-07 (×2): 40 mg via INTRAVENOUS
  Filled 2015-12-07 (×2): qty 4

## 2015-12-07 MED ORDER — WARFARIN SODIUM 5 MG PO TABS
5.0000 mg | ORAL_TABLET | Freq: Once | ORAL | Status: AC
  Administered 2015-12-07: 5 mg via ORAL
  Filled 2015-12-07: qty 1

## 2015-12-07 MED ORDER — RAMIPRIL 5 MG PO CAPS
5.0000 mg | ORAL_CAPSULE | Freq: Every day | ORAL | Status: DC
  Administered 2015-12-07 – 2015-12-12 (×6): 5 mg via ORAL
  Filled 2015-12-07 (×6): qty 1

## 2015-12-07 MED ORDER — INSULIN ASPART 100 UNIT/ML ~~LOC~~ SOLN
1.0000 [IU] | Freq: Three times a day (TID) | SUBCUTANEOUS | Status: DC
  Administered 2015-12-07 – 2015-12-08 (×2): 2 [IU] via SUBCUTANEOUS
  Administered 2015-12-08 – 2015-12-10 (×5): 1 [IU] via SUBCUTANEOUS
  Administered 2015-12-10 – 2015-12-11 (×2): 2 [IU] via SUBCUTANEOUS
  Administered 2015-12-11 – 2015-12-12 (×2): 1 [IU] via SUBCUTANEOUS

## 2015-12-07 MED ORDER — ALPRAZOLAM 0.25 MG PO TABS
0.2500 mg | ORAL_TABLET | Freq: Two times a day (BID) | ORAL | Status: DC | PRN
  Administered 2015-12-07 – 2015-12-11 (×4): 0.25 mg via ORAL
  Filled 2015-12-07 (×5): qty 1

## 2015-12-07 MED ORDER — CARVEDILOL 6.25 MG PO TABS
6.2500 mg | ORAL_TABLET | Freq: Two times a day (BID) | ORAL | Status: DC
  Administered 2015-12-07 – 2015-12-12 (×11): 6.25 mg via ORAL
  Filled 2015-12-07 (×11): qty 1

## 2015-12-07 MED ORDER — POTASSIUM CHLORIDE CRYS ER 20 MEQ PO TBCR: 20.0000 meq | EXTENDED_RELEASE_TABLET | Freq: Two times a day (BID) | ORAL | Status: DC

## 2015-12-07 MED ORDER — SPIRONOLACTONE 12.5 MG HALF TABLET
12.5000 mg | ORAL_TABLET | Freq: Every day | ORAL | Status: DC
  Administered 2015-12-07 – 2015-12-12 (×6): 12.5 mg via ORAL
  Filled 2015-12-07 (×6): qty 1

## 2015-12-07 MED ORDER — POTASSIUM CHLORIDE CRYS ER 20 MEQ PO TBCR
40.0000 meq | EXTENDED_RELEASE_TABLET | Freq: Three times a day (TID) | ORAL | Status: AC
  Administered 2015-12-07 (×2): 40 meq via ORAL
  Filled 2015-12-07 (×2): qty 2

## 2015-12-07 NOTE — Progress Notes (Signed)
PULMONARY / CRITICAL CARE MEDICINE   Name: Tracey [name] MRN: [id number] DOB: 1941-07-19    ADMISSION DATE:  12/03/2015 CONSULTATION DATE:  12/04/2015  REFERRING MD:  Dr. Algie Coffer  CHIEF COMPLAINT:  AMS  HISTORY OF PRESENT ILLNESS:   75 year old female past medical history as below, which includes congestive heart failure, coronary artery disease, type 2 diabetes, hypothyroidism. She is a patient of Dr. Algie Coffer and was directly admitted by him to [facility] 12/03/15 complaining of one-month history of weight gain, leg edema, shortness of breath. She had not been responding well to oral Lasix over that time period. Shortness of breath have been progressive and she was ruled out for MI, and treated with IV Lasix. The day after admission patient again had deteriorating mental status after eating lunch and vomiting. ABG was obtained and showed profound hypercarbic respiratory failure and respiratory acidosis. She was transferred to ICU for BiPAP but shortly after required intubation. PCCM has been consulted.  Of note, sons state that she lives by herself and when have talked with her recently she has seemed more out of breath but has made no other complaints. When they have seen her on the holidays it seemed as though she has "blown up" and filled with fluid. They were able to hear her wheezing.  SUBJECTIVE: No events overnight, feels much better this AM.  VITAL SIGNS: BP 109/65 mmHg  Pulse 106  Temp(Src) 98.6 F (37 C) (Core (Comment))  Resp 17  Ht  (1.6 m)  Wt 104.9 kg (231 lb 4.2 oz)  BMI 40.98 kg/m2  SpO2 99%  HEMODYNAMICS: CVP:  [9 mmHg-18 mmHg] 18 mmHg  VENTILATOR SETTINGS: Vent Mode:  [-]  FiO2 (%):  [2 %-3 %] 2 %  INTAKE / OUTPUT: I/O last 3 completed shifts: In: 1155 [P.O.:225; I.V.:10; NG/GT:770; IV Piggyback:150] Out: 5690 [Urine:5690]  PHYSICAL EXAMINATION: General:  Obese female on NAD Neuro:  Alert and interactive, moving all ext to  command. HEENT:  [place]/AT, PERRL, Unable to assess JVD due to neck girth Cardiovascular:  RRR, no MRG. BLE edema. Lungs:  Diminished throughout Abdomen:  Soft, non-distended Musculoskeletal: No acute deformity Skin:  Grossly intact. RLE erythema.  LABS:  BMET  Recent Labs Lab 12/05/15 0355 12/06/15 0430 12/07/15 0446  NA 141 140 141  K 3.7 4.3 3.6  CL 95* 97* 97*  CO2 36* 33* 33*  BUN 22* 33* 30*  CREATININE 1.11* 1.15* 0.94  GLUCOSE 77 116* 112*   Electrolytes  Recent Labs Lab 12/04/15 1857 12/05/15 0355 12/06/15 0430 12/07/15 0446  CALCIUM  --  7.7* 7.6* 8.1*  MG 1.4*  --  1.9 2.0  PHOS 4.3  --  3.5 3.6   CBC  Recent Labs Lab 12/03/15 1416 12/06/15 0430 12/07/15 0446  WBC 8.9 10.5 10.3  HGB 10.8* 9.5* 9.7*  HCT 38.9 33.8* 36.2  PLT 318 269 269   Coag's  Recent Labs Lab 12/05/15 0355 12/06/15 0430 12/07/15 0446  INR 2.44* 2.79* 2.02*   Sepsis Markers  Recent Labs Lab 12/04/15 1516 12/04/15 1856  LATICACIDVEN 1.1 2.3*   ABG  Recent Labs Lab 12/04/15 1520 12/04/15 1701 12/06/15 0335  PHART 7.085* 7.334* 7.339*  PCO2ART ABOVE REPORTABLE RANGE 70.1* 62.6*  PO2ART 131.0* 116.0* 165*   Liver Enzymes  Recent Labs Lab 12/03/15 1416  AST 13*  ALT 11*  ALKPHOS 70  BILITOT 0.6  ALBUMIN 3.2*   Cardiac Enzymes  Recent Labs Lab 12/03/15 1416 12/03/15 1829 12/04/15  0129  TROPONINI <0.03 <0.03 <0.03   Glucose  Recent Labs Lab 12/06/15 0742 12/06/15 1205 12/06/15 1611 12/06/15 2241 12/07/15 0040 12/07/15 0839  GLUCAP 133* 145* 150* 104* 106* 122*   Imaging I reviewed CXR myself, evidence of mild pulmonary edema persists but improving.  STUDIES:  Echo 1/17 - LVEF 45-50%, Grade 1 DD  CULTURES: BCx2 1/17 >NTD Urine Cx 1/17 >NTD Tracheal asp 1/17 >NTD  ANTIBIOTICS: Rocephin 1/17>>>1/21  SIGNIFICANT EVENTS: 1/16 > admit  LINES/TUBES: ETT 1/17 >1/19 CVL L IJ 1/17 >1/20 R radial a-line  1/17>>>1/20  DISCUSSION: 75 year old female admitted with dyspnea and weight gain treated with lasix. 1/17 was doing OK, but had episode of nausea/vomiting at lunch, and then had deteriorating MS. ABG showed profound respiratory acidosis. She was transferred to ICU for further monitoring.  ASSESSMENT / PLAN:  PULMONARY A: Acute on chronic hypercarbic respiratory failure ?Aspiration PNA H/o asthma  P:   Titrate O2 for sat of 88-92%. Diureses as ordered. IS per RT protocol. Ambulate. PT evaluation.  CARDIOVASCULAR A:  Acute on chronic systolic/diastolic CHF Shock, etiology unclear . Cardiogenic most likely. Also consider septic.  Atrial fibrillation H/o CAD, HTN  P:  Telemetry MAP goal > 65 mm/Hg D/C levophed. D/C Dopamine. Cardiology (Dr. Algie Coffer) following Echo noted ACS ruled out via serial troponin Continue outpt pravastatin per tube. Restart outpatient diltiazem, coreg, lasix, ramipril, aldactone. Coumadin per pharmacy for a-fib.  RENAL A:   Acute renal failure, suspect secondary to diuresis Hypocalcemia  P:   Lasix 40 mg IV q8 x2 doses. Follow BMP KVO IVF. Replace electrolytes as indicated.  GASTROINTESTINAL A:   Nausea/vomiting  P:   Heart healthy diet.  HEMATOLOGIC A:   Warfarin coagulopathy  P:  Warfarin per pharmacy. SCD's. CBC in AM.  INFECTIOUS A:   SIRS/Sepsis RLE cellulitis only identifiable source at this time. Possibly aspiration PNA  P:   Continue rocephin, stop date in place 1/21. Follow cultures as above.  ENDOCRINE A:   Hypothyroid   DM P:   Continue synthroid PO Hold metformin CBG monitoring and SSI  NEUROLOGIC A:   Acute metabolic encephalopathy secondary to hypercarbia, shock Depression  P:   RASS goal: -1 to -2 Restart outpt lexapro  FAMILY  - Updates: Patient updated bedside.  - Inter-disciplinary family meet or Palliative Care meeting due by:  1/24  Discussed with Dr. Algie Coffer.  Will transfer to  SDU and PCCM will sign off, please call back if needed.  Alyson Reedy, M.D. [facility] Pulmonary/Critical Care Medicine. Pager: [phone]. After hours pager: [phone].

## 2015-12-07 NOTE — Evaluation (Signed)
Clinical/Bedside Swallow Evaluation Patient Details  Name: Tracey [name] MRN: [id number] Date of Birth: May 04, 1941  Today's Date: 12/07/2015 Time: SLP Start Time (ACUTE ONLY): 0840 SLP Stop Time (ACUTE ONLY): 0856 SLP Time Calculation (min) (ACUTE ONLY): 16 min  Past Medical History:  Past Medical History  Diagnosis Date  . CHF (congestive heart failure) (HCC)   . Atrial fibrillation (HCC)   . Hypertension   . Thyroid disease   . Coronary artery disease   . Hypothyroidism   . Anxiety   . Shortness of breath     WITH MY CHF  . Anemia   . ACS (acute coronary syndrome) (HCC) 10/27/2013  . Diverticulosis 10/22/2005    Qualifier: Diagnosis of  By: Koleen Distance CMA (AAMA), Hulan Saas    . HIATAL HERNIA 10/22/2005    Qualifier: Diagnosis of  By: Koleen Distance CMA (AAMA), Hulan Saas    . HYPERCHOLESTEROLEMIA 03/09/2008    Qualifier: Diagnosis of  By: Koleen Distance CMA (AAMA), Hulan Saas    . Type II diabetes mellitus (HCC)   . History of blood transfusion     "related to hemorrhaging w/hysterectomy"  . Arthritis     "back; right knee" (12/03/2015)   Past Surgical History:  Past Surgical History  Procedure Laterality Date  . Abdominal hysterectomy    . Cardiac catheterization  10/27/2013  . Coronary angioplasty with stent placement  10/27/2013    DES   TO RCA       DR HARWANI  . Cataract extraction w/ intraocular lens  implant, bilateral Bilateral 2014  . Left and right heart catheterization with coronary angiogram N/A 09/20/2013    Procedure: LEFT AND RIGHT HEART CATHETERIZATION WITH CORONARY ANGIOGRAM;  Surgeon: Ricki Rodriguez, MD;  Location: [facility] CATH LAB;  Service: Cardiovascular;  Laterality: N/A;  . Percutaneous coronary stent intervention (pci-s) N/A 09/22/2013    Procedure: PERCUTANEOUS CORONARY STENT INTERVENTION (PCI-S);  Surgeon: Robynn Pane, MD;  Location: [facility] CATH LAB;  Service: Cardiovascular;  Laterality: N/A;  . Percutaneous coronary stent intervention (pci-s) N/A 10/27/2013    Procedure:  PERCUTANEOUS CORONARY STENT INTERVENTION (PCI-S);  Surgeon: Robynn Pane, MD;  Location: [facility] C[facility] LAB;  Service: Cardiovascular;  Laterality: N/A;  . Appendectomy    . Laparoscopic cholecystectomy     HPI:  75 year old female past medical history CHF, A-fib, HTN, CAD, DM with direct admit with complaints of one-month history of weight gain, leg edema, shortness of breath. Per MD note the day after admission patient again had deteriorating mental status after eating lunch and vomiting. ABG was obtained and showed profound hypercarbic respiratory failure and respiratory acidosis and transferred to ICU intubated 1/16-1/19. CXR 1/19 subtle infiltrate right base.   Assessment / Plan / Recommendation Clinical Impression  Mild indications of decreased airway protection only when utilizing straw with thin water likely due to increase in volume and velocity. Cup sips consistently consumed without difficulty. Mastication and transit swift. Recommend regluar texture with thin liquids, no straws, sit upright and pills with applesauce. No further ST needed.     Aspiration Risk       Diet Recommendation Regular;Thin liquid   Liquid Administration via: Cup;No straw Medication Administration: Whole meds with puree Supervision: Patient able to self feed Compensations: Slow rate;Small sips/bites Postural Changes: Seated upright at 90 degrees    Other  Recommendations Oral Care Recommendations: Oral care BID   Follow up Recommendations  None    Frequency and Duration            Prognosis  Swallow Study   General HPI: 75 year old female past medical history CHF, A-fib, HTN, CAD, DM with direct admit with complaints of one-month history of weight gain, leg edema, shortness of breath. Per MD note the day after admission patient again had deteriorating mental status after eating lunch and vomiting. ABG was obtained and showed profound hypercarbic respiratory failure and respiratory acidosis and  transferred to ICU intubated 1/16-1/19. CXR 1/19 subtle infiltrate right base. Type of Study: Bedside Swallow Evaluation Previous Swallow Assessment: none Diet Prior to this Study: NPO Temperature Spikes Noted: Yes (99.4) Respiratory Status: Nasal cannula History of Recent Intubation: No Behavior/Cognition: Alert;Cooperative;Pleasant mood Oral Cavity Assessment: Within Functional Limits Oral Care Completed by SLP: Yes Oral Cavity - Dentition: Dentures, top;Dentures, bottom Vision: Functional for self-feeding Self-Feeding Abilities: Able to feed self Patient Positioning: Upright in bed Baseline Vocal Quality: Normal Volitional Cough: Strong Volitional Swallow: Able to elicit    Oral/Motor/Sensory Function Overall Oral Motor/Sensory Function: Within functional limits   Ice Chips Ice chips: Not tested   Thin Liquid Thin Liquid: Impaired Presentation: Cup;Straw Oral Phase Impairments:  (none) Oral Phase Functional Implications:  (none) Pharyngeal  Phase Impairments: Cough - Immediate (with straw)    Nectar Thick Nectar Thick Liquid: Not tested   Honey Thick Honey Thick Liquid: Not tested   Puree Puree: Within functional limits   Solid   GO   Solid: Within functional limits        Royce Macadamia 12/07/2015,11:06 AM   Breck Coons Lonell Face.Ed [employer] [phone]

## 2015-12-07 NOTE — Evaluation (Signed)
Physical Therapy Evaluation Patient Details Name: Tracey [name] MRN: [id number] DOB: [birth year] Today's Date: 12/07/2015   History of Present Illness  75 year old female past medical history CHF, A-fib, HTN, CAD, DM with direct admit with complaints of one-month history of weight gain, leg edema, shortness of breath. Per MD note the day after admission patient again had deteriorating mental status after eating lunch and vomiting. ABG was obtained and showed profound hypercarbic respiratory failure and respiratory acidosis and transferred to ICU intubated 1/16-1/19.  Clinical Impression  Patient presents with decreased mobility due to deficits listed in PT problem list.  She will benefit from skilled PT in the acute setting to allow return home following SNF level rehab stay.    Follow Up Recommendations SNF;Supervision/Assistance - 24 hour    Equipment Recommendations  None recommended by PT    Recommendations for Other Services       Precautions / Restrictions Precautions Precautions: Fall      Mobility  Bed Mobility               General bed mobility comments: in recliner  Transfers Overall transfer level: Needs assistance Equipment used: Rolling walker (2 wheeled) Transfers: Sit to/from Stand Sit to Stand: Mod assist         General transfer comment: with cues and UE support  Ambulation/Gait Ambulation/Gait assistance: Min assist;Mod assist Ambulation Distance (Feet): 60 Feet (x 2) Assistive device: Rolling walker (2 wheeled) Gait Pattern/deviations: Step-through pattern;Decreased stride length     General Gait Details: pt related fatigue so turned to go back to room with assist for walker, then her knees began to buckle so assist given and allowed to rest seated in chair prior to walk back to room  Stairs            Wheelchair Mobility    Modified Rankin (Stroke Patients Only)       Balance Overall balance assessment: Needs assistance         Standing balance support: Bilateral upper extremity supported Standing balance-Leahy Scale: Poor Standing balance comment: UE support needed for balance                             Pertinent Vitals/Pain Pain Assessment: No/denies pain    Home Living Family/patient expects to be discharged to:: Skilled nursing facility Living Arrangements: Alone Available Help at Discharge: Friend(s);Available PRN/intermittently Type of Home: House Home Access: Stairs to enter Entrance Stairs-Rails: Right Entrance Stairs-Number of Steps: 4 Home Layout: One level Home Equipment: Walker - 2 wheels      Prior Function Level of Independence: Independent               Hand Dominance        Extremity/Trunk Assessment               Lower Extremity Assessment: RLE deficits/detail;LLE deficits/detail RLE Deficits / Details: AROM WFL, strength grossly 4-/5; noted at times myoclonic jerking when holding legs up antigravity LLE Deficits / Details: AROM WFL, strength grossly 4-/5; noted at times myoclonic jerking when holding legs up antigravity     Communication   Communication: No difficulties  Cognition Arousal/Alertness: Lethargic Behavior During Therapy: WFL for tasks assessed/performed Overall Cognitive Status: Within Functional Limits for tasks assessed                      General Comments General comments (skin integrity, edema, etc.): noted blood stain on  pad when pt stood and RN reports several areas of open skin on her back; has redness and swelling R LE with bandage on anterior lower leg pt relates initiated tx for cellulitis and it only gave her a rash    Exercises        Assessment/Plan    PT Assessment Patient needs continued PT services  PT Diagnosis Generalized weakness;Abnormality of gait   PT Problem List Decreased strength;Decreased knowledge of use of DME;Decreased activity tolerance;Decreased balance;Decreased  mobility;Cardiopulmonary status limiting activity;Decreased safety awareness  PT Treatment Interventions DME instruction;Gait training;Functional mobility training;Patient/family education;Balance training;Therapeutic activities;Therapeutic exercise   PT Goals (Current goals can be found in the Care Plan section) Acute Rehab PT Goals Patient Stated Goal: To get strong and return home PT Goal Formulation: With patient Time For Goal Achievement: 12/14/15 Potential to Achieve Goals: Good    Frequency Min 3X/week   Barriers to discharge Decreased caregiver support      Co-evaluation               End of Session Equipment Utilized During Treatment: Gait belt Activity Tolerance: Patient limited by fatigue Patient left: with call bell/phone within reach;in chair           Time: 1500-1530 PT Time Calculation (min) (ACUTE ONLY): 30 min   Charges:   PT Evaluation $PT Eval High Complexity: 1 Procedure PT Treatments $Gait Training: 8-22 mins   PT G CodesElray Mcgregor Dec 28, 2015, 5:26 PM Sheran Lawless, PT [phone] 2015/12/28

## 2015-12-07 NOTE — Discharge Instructions (Signed)

## 2015-12-07 NOTE — Progress Notes (Signed)
Per Dr. Molli Knock, leave central line in place for CVP monitoring and leave urinary catheter in for aggressive IV diuresis today.

## 2015-12-07 NOTE — Progress Notes (Signed)
ANTICOAGULATION CONSULT NOTE - Follow-Up Consult  Pharmacy Consult for warfarin Indication: atrial fibrillation  Allergies  Allergen Reactions  . Tramadol Other (See Comments)    Severe confusion  . Codeine Nausea And Vomiting  . Doxycycline Itching and Rash    Patient Measurements: Height:  (160 cm) Weight: 231 lb 4.2 oz (104.9 kg) IBW/kg (Calculated) : 52.4  Vital Signs: Temp: 99.4 F (37.4 C) (01/20 0400) Temp Source: Oral (01/20 0400) BP: 108/70 mmHg (01/20 0700) Pulse Rate: 99 (01/20 0700)  Labs:  Recent Labs  12/05/15 0355 12/06/15 0430 12/07/15 0446  HGB  --  9.5* 9.7*  HCT  --  33.8* 36.2  PLT  --  269 269  LABPROT 26.2* 29.0* 22.8*  INR 2.44* 2.79* 2.02*  CREATININE 1.11* 1.15* 0.94    Estimated Creatinine Clearance: 59.9 mL/min (by C-G formula based on Cr of 0.94).   Medical History: Past Medical History  Diagnosis Date  . CHF (congestive heart failure) (HCC)   . Atrial fibrillation (HCC)   . Hypertension   . Thyroid disease   . Coronary artery disease   . Hypothyroidism   . Anxiety   . Shortness of breath     WITH MY CHF  . Anemia   . ACS (acute coronary syndrome) (HCC) 10/27/2013  . Diverticulosis 10/22/2005    Qualifier: Diagnosis of  By: Koleen Distance CMA (AAMA), Hulan Saas    . HIATAL HERNIA 10/22/2005    Qualifier: Diagnosis of  By: Koleen Distance CMA (AAMA), Hulan Saas    . HYPERCHOLESTEROLEMIA 03/09/2008    Qualifier: Diagnosis of  By: Koleen Distance CMA (AAMA), Hulan Saas    . Type II diabetes mellitus (HCC)   . History of blood transfusion     "related to hemorrhaging w/hysterectomy"  . Arthritis     "back; right knee" (12/03/2015)    Medications:  Prescriptions prior to admission  Medication Sig Dispense Refill Last Dose  . ALPRAZolam (XANAX) 0.5 MG tablet TAKE 1 TABLET BY MOUTH TWICE A DAY AS NEEDED FOR ANXIETY (Patient taking differently: Take 0.5 mg by mouth daily at bedtime.) 60 tablet 1 12/02/2015 at Unknown time  . carvedilol (COREG) 6.25 MG tablet  Take 1 tablet (6.25 mg total) by mouth 2 (two) times daily with a meal. 60 tablet 3 12/03/2015 at 830  . clopidogrel (PLAVIX) 75 MG tablet Take 1 tablet (75 mg total) by mouth daily. 30 tablet 3 12/03/2015 at 830  . diltiazem (CARDIZEM) 30 MG tablet Take 30 mg by mouth 2 (two) times daily.    12/03/2015 at Unknown time  . escitalopram (LEXAPRO) 20 MG tablet Take 1 tablet (20 mg total) by mouth daily. 90 tablet 3 12/03/2015 at Unknown time  . furosemide (LASIX) 40 MG tablet Take 1 tablet (40 mg total) by mouth 2 (two) times daily. (Patient taking differently: Take 40 mg by mouth 3 (three) times daily. ) 60 tablet 3 12/03/2015 at Unknown time  . glipiZIDE (GLUCOTROL) 10 MG tablet TAKE 1 TABLET (10 MG TOTAL) BY MOUTH 2 (TWO) TIMES DAILY BEFORE A MEAL. 60 tablet 10 12/03/2015 at Unknown time  . Insulin Glargine (LANTUS SOLOSTAR) 100 UNIT/ML Solostar Pen Inject 15 Units into the skin 2 (two) times daily. 15 mL 11 12/03/2015 at Unknown time  . levothyroxine (SYNTHROID, LEVOTHROID) 75 MCG tablet Take 1 tablet (75 mcg total) by mouth daily before breakfast. 90 tablet 3 12/03/2015 at Unknown time  . lovastatin (MEVACOR) 40 MG tablet Take 1 tablet (40 mg total) by mouth every other day.  45 tablet 3 12/01/2015 at pm  . metFORMIN (GLUCOPHAGE) 500 MG tablet Take 500 mg by mouth 2 (two) times daily with a meal.   12/03/2015 at Unknown time  . metoprolol tartrate (LOPRESSOR) 25 MG tablet Take 25 mg by mouth at bedtime.    12/02/2015 at 2300  . Multiple Vitamin (MULTIVITAMIN WITH MINERALS) TABS tablet Take 1 tablet by mouth daily.   12/03/2015 at Unknown time  . oxyCODONE-acetaminophen (ROXICET) 5-325 MG tablet Take 0.5-1 tablets by mouth every 8 (eight) hours as needed for severe pain. 5 tablet 0 12/02/2015 at pm  . ramipril (ALTACE) 10 MG capsule Take 1 capsule (10 mg total) by mouth daily. 90 capsule 3 12/03/2015 at Unknown time  . warfarin (COUMADIN) 5 MG tablet Take 5 mg by mouth every evening.   12/02/2015 at 2300  .  diclofenac sodium (VOLTAREN) 1 % GEL Apply 4 g topically 4 (four) times daily. As needed for pain (Patient not taking: Reported on 12/03/2015) 400 g 5 Not Taking at Unknown time  . doxycycline (VIBRA-TABS) 100 MG tablet Take 1 tablet (100 mg total) by mouth 2 (two) times daily. (Patient not taking: Reported on 12/03/2015) 20 tablet 0 Completed Course at Unknown time  . spironolactone (ALDACTONE) 25 MG tablet Take 1 tablet (25 mg total) by mouth daily. (Patient not taking: Reported on 12/03/2015) 30 tablet 3 Not Taking at Unknown time    Assessment: 75 yo F w/ PMH of CHF, afib, DM, HTN, CAD presenting with CHF exacerbation. Pharmacy consulted to continue warfarin for afib.  PTA warfarin dose 5 mg daily  Hgb 9.7, Plt wnl, INR 2.02  Goal of Therapy:  INR 2-3 Monitor platelets by anticoagulation protocol: Yes   Plan:  - Warfarin  tonight  - daily PT/INR - CBC q72h - monitor for s/sx of bleeding  Remi Haggard, PharmD Clinical Pharmacist- Resident Pager: [phone]   12/07/2015,7:47 AM

## 2015-12-07 NOTE — Progress Notes (Addendum)
Pt received from [facility] per chair  Oriented to room. Given call light no c/o pain

## 2015-12-07 NOTE — Progress Notes (Signed)
Right radial arterial line removed per MD order. Pressure held for 5 minutes. Site level 0, with no complications.

## 2015-12-07 NOTE — Care Management Important Message (Signed)
Important Message  Patient Details  Name: Tracey [name] MRN: [id number] Date of Birth: 01-Sep-1941   Medicare Important Message Given:  Yes    Seymour Pavlak P Corrion Stirewalt 12/07/2015, 11:25 AM

## 2015-12-07 NOTE — Progress Notes (Signed)
Patient states she feels "jittery and anxious". Dr. Algie Coffer aware, orders for Xanax PRN. Dr. Algie Coffer also made aware of small amount of blood found on bed pad. Orders given for occult stool.

## 2015-12-08 LAB — BASIC METABOLIC PANEL
Anion gap: 7 (ref 5–15)
BUN: 29 mg/dL — ABNORMAL HIGH (ref 6–20)
CALCIUM: 8.3 mg/dL — AB (ref 8.9–10.3)
CO2: 34 mmol/L — ABNORMAL HIGH (ref 22–32)
CREATININE: 0.91 mg/dL (ref 0.44–1.00)
Chloride: 99 mmol/L — ABNORMAL LOW (ref 101–111)
Glucose, Bld: 127 mg/dL — ABNORMAL HIGH (ref 65–99)
Potassium: 4.3 mmol/L (ref 3.5–5.1)
SODIUM: 140 mmol/L (ref 135–145)

## 2015-12-08 LAB — GLUCOSE, CAPILLARY
GLUCOSE-CAPILLARY: 125 mg/dL — AB (ref 65–99)
GLUCOSE-CAPILLARY: 114 mg/dL — AB (ref 65–99)
Glucose-Capillary: 139 mg/dL — ABNORMAL HIGH (ref 65–99)
Glucose-Capillary: 174 mg/dL — ABNORMAL HIGH (ref 65–99)

## 2015-12-08 LAB — CBC
HCT: 35.7 % — ABNORMAL LOW (ref 36.0–46.0)
Hemoglobin: 9.9 g/dL — ABNORMAL LOW (ref 12.0–15.0)
MCH: 23.9 pg — ABNORMAL LOW (ref 26.0–34.0)
MCHC: 27.7 g/dL — AB (ref 30.0–36.0)
MCV: 86 fL (ref 78.0–100.0)
Platelets: 253 10*3/uL (ref 150–400)
RBC: 4.15 MIL/uL (ref 3.87–5.11)
RDW: 16.3 % — AB (ref 11.5–15.5)
WBC: 11.9 10*3/uL — AB (ref 4.0–10.5)

## 2015-12-08 LAB — PROTIME-INR
INR: 2.02 — AB (ref 0.00–1.49)
PROTHROMBIN TIME: 22.8 s — AB (ref 11.6–15.2)

## 2015-12-08 LAB — PHOSPHORUS: Phosphorus: 2.9 mg/dL (ref 2.5–4.6)

## 2015-12-08 LAB — MAGNESIUM: MAGNESIUM: 2 mg/dL (ref 1.7–2.4)

## 2015-12-08 MED ORDER — PRO-STAT SUGAR FREE PO LIQD
60.0000 mL | Freq: Two times a day (BID) | ORAL | Status: DC
  Administered 2015-12-09 – 2015-12-12 (×7): 60 mL via ORAL
  Filled 2015-12-08 (×5): qty 60

## 2015-12-08 MED ORDER — WARFARIN SODIUM 5 MG PO TABS
5.0000 mg | ORAL_TABLET | Freq: Once | ORAL | Status: AC
  Administered 2015-12-08: 5 mg via ORAL
  Filled 2015-12-08: qty 1

## 2015-12-08 NOTE — Progress Notes (Signed)
Subjective:  Patient denies any chest pain states her breathing is improved.  Objective:  Vital Signs in the last 24 hours: Temp:  [97.5 F (36.4 C)-99.3 F (37.4 C)] 98 F (36.7 C) (01/21 1150) Pulse Rate:  [83-100] 83 (01/21 1150) Resp:  [14-24] 14 (01/21 1150) BP: (91-111)/(40-76) 104/63 mmHg (01/21 1150) SpO2:  [93 %-100 %] 97 % (01/21 1150) Weight:  [106.006 kg (233 lb 11.2 oz)] 106.006 kg (233 lb 11.2 oz) (01/21 0500)  Intake/Output from previous day: 01/20 0701 - 01/21 0700 In: 530 [P.O.:480; IV Piggyback:50] Out: 1545 [Urine:1545] Intake/Output from this shift: Total I/O In: 60 [P.O.:60] Out: 450 [Urine:450]  Physical Exam: Neck: no adenopathy, no carotid bruit, no JVD and supple, symmetrical, trachea midline Lungs: Decreased breath sound at bases with occasional right rhonchi Heart: irregularly irregular rhythm, S1, S2 normal and 2/6 systolic murmur noted Abdomen: soft, non-tender; bowel sounds normal; no masses,  no organomegaly Extremities: No clubbing cyanosis 1+ edema more in the right than left leg  Lab Results:  Recent Labs  12/07/15 0446 12/08/15 0510  WBC 10.3 11.9*  HGB 9.7* 9.9*  PLT 269 253    Recent Labs  12/07/15 0446 12/08/15 0510  NA 141 140  K 3.6 4.3  CL 97* 99*  CO2 33* 34*  GLUCOSE 112* 127*  BUN 30* 29*  CREATININE 0.94 0.91   No results for input(s): TROPONINI in the last 72 hours.  Invalid input(s): CK, MB Hepatic Function Panel No results for input(s): PROT, ALBUMIN, AST, ALT, ALKPHOS, BILITOT, BILIDIR, IBILI in the last 72 hours. No results for input(s): CHOL in the last 72 hours. No results for input(s): PROTIME in the last 72 hours.  Imaging: Imaging results have been reviewed and No results found.  Cardiac Studies:  Assessment/Plan:  Resolving right lung possible aspiration pneumonia Status post acute on chronic hypercarbic respiratory failure Resolving acute on chronic left heart systolic failure Coronary  artery disease history of PCI to RCA in the past Hypertension Diabetes mellitus Hyperlipidemia Chronic atrial fibrillation Morbid obesity Hypothyroidism History of bronchial asthma Plan Continue present management and OT PT consult May need ischemic workup  LOS: 5 days    Rinaldo Cloud 12/08/2015, 12:06 PM

## 2015-12-08 NOTE — Progress Notes (Signed)
ANTICOAGULATION CONSULT NOTE - Follow-Up Consult  Pharmacy Consult for warfarin Indication: atrial fibrillation  Allergies  Allergen Reactions  . Tramadol Other (See Comments)    Severe confusion  . Codeine Nausea And Vomiting  . Latex     Rash  . Doxycycline Itching and Rash    Patient Measurements: Height:  (160 cm) Weight: 233 lb 11.2 oz (106.006 kg) IBW/kg (Calculated) : 52.4  Vital Signs: Temp: 97.5 F (36.4 C) (01/21 0726) Temp Source: Oral (01/21 0726) BP: 105/52 mmHg (01/21 0810) Pulse Rate: 93 (01/21 0810)  Labs:  Recent Labs  12/06/15 0430 12/07/15 0446 12/08/15 0510  HGB 9.5* 9.7* 9.9*  HCT 33.8* 36.2 35.7*  PLT 269 269 253  LABPROT 29.0* 22.8* 22.8*  INR 2.79* 2.02* 2.02*  CREATININE 1.15* 0.94 0.91    Estimated Creatinine Clearance: 62.2 mL/min (by C-G formula based on Cr of 0.91).   Medical History: Past Medical History  Diagnosis Date  . CHF (congestive heart failure) (HCC)   . Atrial fibrillation (HCC)   . Hypertension   . Thyroid disease   . Coronary artery disease   . Hypothyroidism   . Anxiety   . Shortness of breath     WITH MY CHF  . Anemia   . ACS (acute coronary syndrome) (HCC) 10/27/2013  . Diverticulosis 10/22/2005    Qualifier: Diagnosis of  By: Koleen Distance CMA (AAMA), Hulan Saas    . HIATAL HERNIA 10/22/2005    Qualifier: Diagnosis of  By: Koleen Distance CMA (AAMA), Hulan Saas    . HYPERCHOLESTEROLEMIA 03/09/2008    Qualifier: Diagnosis of  By: Koleen Distance CMA (AAMA), Hulan Saas    . Type II diabetes mellitus (HCC)   . History of blood transfusion     "related to hemorrhaging w/hysterectomy"  . Arthritis     "back; right knee" (12/03/2015)    Medications:  Prescriptions prior to admission  Medication Sig Dispense Refill Last Dose  . ALPRAZolam (XANAX) 0.5 MG tablet TAKE 1 TABLET BY MOUTH TWICE A DAY AS NEEDED FOR ANXIETY (Patient taking differently: Take 0.5 mg by mouth daily at bedtime.) 60 tablet 1 12/02/2015 at Unknown time  . carvedilol  (COREG) 6.25 MG tablet Take 1 tablet (6.25 mg total) by mouth 2 (two) times daily with a meal. 60 tablet 3 12/03/2015 at 830  . clopidogrel (PLAVIX) 75 MG tablet Take 1 tablet (75 mg total) by mouth daily. 30 tablet 3 12/03/2015 at 830  . diltiazem (CARDIZEM) 30 MG tablet Take 30 mg by mouth 2 (two) times daily.    12/03/2015 at Unknown time  . escitalopram (LEXAPRO) 20 MG tablet Take 1 tablet (20 mg total) by mouth daily. 90 tablet 3 12/03/2015 at Unknown time  . furosemide (LASIX) 40 MG tablet Take 1 tablet (40 mg total) by mouth 2 (two) times daily. (Patient taking differently: Take 40 mg by mouth 3 (three) times daily. ) 60 tablet 3 12/03/2015 at Unknown time  . glipiZIDE (GLUCOTROL) 10 MG tablet TAKE 1 TABLET (10 MG TOTAL) BY MOUTH 2 (TWO) TIMES DAILY BEFORE A MEAL. 60 tablet 10 12/03/2015 at Unknown time  . Insulin Glargine (LANTUS SOLOSTAR) 100 UNIT/ML Solostar Pen Inject 15 Units into the skin 2 (two) times daily. 15 mL 11 12/03/2015 at Unknown time  . levothyroxine (SYNTHROID, LEVOTHROID) 75 MCG tablet Take 1 tablet (75 mcg total) by mouth daily before breakfast. 90 tablet 3 12/03/2015 at Unknown time  . lovastatin (MEVACOR) 40 MG tablet Take 1 tablet (40 mg total) by mouth every  other day. 45 tablet 3 12/01/2015 at pm  . metFORMIN (GLUCOPHAGE) 500 MG tablet Take 500 mg by mouth 2 (two) times daily with a meal.   12/03/2015 at Unknown time  . metoprolol tartrate (LOPRESSOR) 25 MG tablet Take 25 mg by mouth at bedtime.    12/02/2015 at 2300  . Multiple Vitamin (MULTIVITAMIN WITH MINERALS) TABS tablet Take 1 tablet by mouth daily.   12/03/2015 at Unknown time  . oxyCODONE-acetaminophen (ROXICET) 5-325 MG tablet Take 0.5-1 tablets by mouth every 8 (eight) hours as needed for severe pain. 5 tablet 0 12/02/2015 at pm  . ramipril (ALTACE) 10 MG capsule Take 1 capsule (10 mg total) by mouth daily. 90 capsule 3 12/03/2015 at Unknown time  . warfarin (COUMADIN) 5 MG tablet Take 5 mg by mouth every evening.    12/02/2015 at 2300  . diclofenac sodium (VOLTAREN) 1 % GEL Apply 4 g topically 4 (four) times daily. As needed for pain (Patient not taking: Reported on 12/03/2015) 400 g 5 Not Taking at Unknown time  . doxycycline (VIBRA-TABS) 100 MG tablet Take 1 tablet (100 mg total) by mouth 2 (two) times daily. (Patient not taking: Reported on 12/03/2015) 20 tablet 0 Completed Course at Unknown time  . spironolactone (ALDACTONE) 25 MG tablet Take 1 tablet (25 mg total) by mouth daily. (Patient not taking: Reported on 12/03/2015) 30 tablet 3 Not Taking at Unknown time    Assessment: 75 yo F w/ PMH of CHF, afib, DM, HTN, CAD presenting with CHF exacerbation. Afib warf PTA INR admit 2.17, On home warf  qd, last home dose reported 1/15, INR remains at 2.02 (note previously we were decreasing home dose d/t rapid INR increases) Hgb low but stable at 9.9, plts wnl  Goal of Therapy:  INR 2-3 Monitor platelets by anticoagulation protocol: Yes   Plan:  Give coumadin  PO x 1 tonight Monitor daily INR, CBC, s/s of bleed  Enzo Bi, PharmD, [facility] Clinical Pharmacist Pager [phone] 12/08/2015 8:59 AM

## 2015-12-09 LAB — CBC
HCT: 34.6 % — ABNORMAL LOW (ref 36.0–46.0)
Hemoglobin: 10.5 g/dL — ABNORMAL LOW (ref 12.0–15.0)
MCH: 25.1 pg — AB (ref 26.0–34.0)
MCHC: 30.3 g/dL (ref 30.0–36.0)
MCV: 82.6 fL (ref 78.0–100.0)
PLATELETS: 578 10*3/uL — AB (ref 150–400)
RBC: 4.19 MIL/uL (ref 3.87–5.11)
RDW: 18.5 % — ABNORMAL HIGH (ref 11.5–15.5)
WBC: 10.5 10*3/uL (ref 4.0–10.5)

## 2015-12-09 LAB — CULTURE, BLOOD (ROUTINE X 2): CULTURE: NO GROWTH

## 2015-12-09 LAB — GLUCOSE, CAPILLARY
GLUCOSE-CAPILLARY: 110 mg/dL — AB (ref 65–99)
Glucose-Capillary: 122 mg/dL — ABNORMAL HIGH (ref 65–99)
Glucose-Capillary: 139 mg/dL — ABNORMAL HIGH (ref 65–99)
Glucose-Capillary: 144 mg/dL — ABNORMAL HIGH (ref 65–99)

## 2015-12-09 LAB — PROTIME-INR
INR: 2.02 — ABNORMAL HIGH (ref 0.00–1.49)
PROTHROMBIN TIME: 22.7 s — AB (ref 11.6–15.2)

## 2015-12-09 MED ORDER — WARFARIN SODIUM 5 MG PO TABS
5.0000 mg | ORAL_TABLET | Freq: Once | ORAL | Status: AC
  Administered 2015-12-09: 5 mg via ORAL
  Filled 2015-12-09: qty 1

## 2015-12-09 NOTE — Progress Notes (Signed)
ANTICOAGULATION CONSULT NOTE - Follow-Up Consult  Pharmacy Consult for warfarin Indication: atrial fibrillation  Allergies  Allergen Reactions  . Tramadol Other (See Comments)    Severe confusion  . Codeine Nausea And Vomiting  . Latex     Rash  . Doxycycline Itching and Rash    Patient Measurements: Height:  (160 cm) Weight: 233 lb 14.5 oz (106.1 kg) IBW/kg (Calculated) : 52.4  Vital Signs: Temp: 97.7 F (36.5 C) (01/22 0821) Temp Source: Oral (01/22 0821) BP: 123/85 mmHg (01/22 0821) Pulse Rate: 85 (01/22 0821)  Labs:  Recent Labs  12/07/15 0446 12/08/15 0510 12/09/15 0550  HGB 9.7* 9.9* 10.5*  HCT 36.2 35.7* 34.6*  PLT 269 253 578*  LABPROT 22.8* 22.8* 22.7*  INR 2.02* 2.02* 2.02*  CREATININE 0.94 0.91  --     Estimated Creatinine Clearance: 62.3 mL/min (by C-G formula based on Cr of 0.91).   Medical History: Past Medical History  Diagnosis Date  . CHF (congestive heart failure) (HCC)   . Atrial fibrillation (HCC)   . Hypertension   . Thyroid disease   . Coronary artery disease   . Hypothyroidism   . Anxiety   . Shortness of breath     WITH MY CHF  . Anemia   . ACS (acute coronary syndrome) (HCC) 10/27/2013  . Diverticulosis 10/22/2005    Qualifier: Diagnosis of  By: Koleen Distance CMA (AAMA), Hulan Saas    . HIATAL HERNIA 10/22/2005    Qualifier: Diagnosis of  By: Koleen Distance CMA (AAMA), Hulan Saas    . HYPERCHOLESTEROLEMIA 03/09/2008    Qualifier: Diagnosis of  By: Koleen Distance CMA (AAMA), Hulan Saas    . Type II diabetes mellitus (HCC)   . History of blood transfusion     "related to hemorrhaging w/hysterectomy"  . Arthritis     "back; right knee" (12/03/2015)    Medications:  Prescriptions prior to admission  Medication Sig Dispense Refill Last Dose  . ALPRAZolam (XANAX) 0.5 MG tablet TAKE 1 TABLET BY MOUTH TWICE A DAY AS NEEDED FOR ANXIETY (Patient taking differently: Take 0.5 mg by mouth daily at bedtime.) 60 tablet 1 12/02/2015 at Unknown time  . carvedilol  (COREG) 6.25 MG tablet Take 1 tablet (6.25 mg total) by mouth 2 (two) times daily with a meal. 60 tablet 3 12/03/2015 at 830  . clopidogrel (PLAVIX) 75 MG tablet Take 1 tablet (75 mg total) by mouth daily. 30 tablet 3 12/03/2015 at 830  . diltiazem (CARDIZEM) 30 MG tablet Take 30 mg by mouth 2 (two) times daily.    12/03/2015 at Unknown time  . escitalopram (LEXAPRO) 20 MG tablet Take 1 tablet (20 mg total) by mouth daily. 90 tablet 3 12/03/2015 at Unknown time  . furosemide (LASIX) 40 MG tablet Take 1 tablet (40 mg total) by mouth 2 (two) times daily. (Patient taking differently: Take 40 mg by mouth 3 (three) times daily. ) 60 tablet 3 12/03/2015 at Unknown time  . glipiZIDE (GLUCOTROL) 10 MG tablet TAKE 1 TABLET (10 MG TOTAL) BY MOUTH 2 (TWO) TIMES DAILY BEFORE A MEAL. 60 tablet 10 12/03/2015 at Unknown time  . Insulin Glargine (LANTUS SOLOSTAR) 100 UNIT/ML Solostar Pen Inject 15 Units into the skin 2 (two) times daily. 15 mL 11 12/03/2015 at Unknown time  . levothyroxine (SYNTHROID, LEVOTHROID) 75 MCG tablet Take 1 tablet (75 mcg total) by mouth daily before breakfast. 90 tablet 3 12/03/2015 at Unknown time  . lovastatin (MEVACOR) 40 MG tablet Take 1 tablet (40 mg total) by  mouth every other day. 45 tablet 3 12/01/2015 at pm  . metFORMIN (GLUCOPHAGE) 500 MG tablet Take 500 mg by mouth 2 (two) times daily with a meal.   12/03/2015 at Unknown time  . metoprolol tartrate (LOPRESSOR) 25 MG tablet Take 25 mg by mouth at bedtime.    12/02/2015 at 2300  . Multiple Vitamin (MULTIVITAMIN WITH MINERALS) TABS tablet Take 1 tablet by mouth daily.   12/03/2015 at Unknown time  . oxyCODONE-acetaminophen (ROXICET) 5-325 MG tablet Take 0.5-1 tablets by mouth every 8 (eight) hours as needed for severe pain. 5 tablet 0 12/02/2015 at pm  . ramipril (ALTACE) 10 MG capsule Take 1 capsule (10 mg total) by mouth daily. 90 capsule 3 12/03/2015 at Unknown time  . warfarin (COUMADIN) 5 MG tablet Take 5 mg by mouth every evening.    12/02/2015 at 2300  . diclofenac sodium (VOLTAREN) 1 % GEL Apply 4 g topically 4 (four) times daily. As needed for pain (Patient not taking: Reported on 12/03/2015) 400 g 5 Not Taking at Unknown time  . doxycycline (VIBRA-TABS) 100 MG tablet Take 1 tablet (100 mg total) by mouth 2 (two) times daily. (Patient not taking: Reported on 12/03/2015) 20 tablet 0 Completed Course at Unknown time  . spironolactone (ALDACTONE) 25 MG tablet Take 1 tablet (25 mg total) by mouth daily. (Patient not taking: Reported on 12/03/2015) 30 tablet 3 Not Taking at Unknown time    Assessment: 75 yo F w/ PMH of CHF, afib, DM, HTN, CAD presenting with CHF exacerbation. Afib warf PTA INR admit 2.17, On home warf  qd, last home dose reported 1/15, INR remains at 2.02 (note previously we were decreasing home dose d/t rapid INR increases) Hgb low but stable at 9.9, plts wnl. No s/s of bleed.  Goal of Therapy:  INR 2-3 Monitor platelets by anticoagulation protocol: Yes   Plan:  Give coumadin  PO x 1 tonight Monitor daily INR, CBC, s/s of bleed  Enzo Bi, PharmD, [facility] Clinical Pharmacist Pager [phone] 12/09/2015 8:34 AM

## 2015-12-09 NOTE — Progress Notes (Signed)
Subjective:   complains of cough with yellowish mucus. States breathing is gradually improving  Objective:  Vital Signs in the last 24 hours: Temp:  [97.7 F (36.5 C)-98.1 F (36.7 C)] 97.7 F (36.5 C) (01/22 0821) Pulse Rate:  [83-99] 91 (01/22 0846) Resp:  [14-23] 18 (01/22 0821) BP: (92-123)/(59-85) 123/85 mmHg (01/22 0846) SpO2:  [97 %-99 %] 98 % (01/22 0821) Weight:  [106.1 kg (233 lb 14.5 oz)] 106.1 kg (233 lb 14.5 oz) (01/22 0400)  Intake/Output from previous day: 01/21 0701 - 01/22 0700 In: 480 [P.O.:480] Out: 1100 [Urine:1100] Intake/Output from this shift:    Physical Exam: Neck: no adenopathy, no carotid bruit, no JVD and supple, symmetrical, trachea midline Lungs: Decreased breath sound at bases Heart: irregularly irregular rhythm, S1, S2 normal and 2/6 systolic murmur noted Abdomen: soft, non-tender; bowel sounds normal; no masses,  no organomegaly Extremities: No clubbing cyanosis trace edema noted  Lab Results:  Recent Labs  12/08/15 0510 12/09/15 0550  WBC 11.9* 10.5  HGB 9.9* 10.5*  PLT 253 578*    Recent Labs  12/07/15 0446 12/08/15 0510  NA 141 140  K 3.6 4.3  CL 97* 99*  CO2 33* 34*  GLUCOSE 112* 127*  BUN 30* 29*  CREATININE 0.94 0.91   No results for input(s): TROPONINI in the last 72 hours.  Invalid input(s): CK, MB Hepatic Function Panel No results for input(s): PROT, ALBUMIN, AST, ALT, ALKPHOS, BILITOT, BILIDIR, IBILI in the last 72 hours. No results for input(s): CHOL in the last 72 hours. No results for input(s): PROTIME in the last 72 hours.  Imaging: Imaging results have been reviewed and No results found.  Cardiac Studies:  Assessment/Plan:  Resolving right lung possible aspiration pneumonia Status post acute on chronic hypercarbic respiratory failure Resolving acute on chronic left heart systolic failure Coronary artery disease history of PCI to RCA in the past Hypertension Diabetes mellitus Hyperlipidemia Chronic  atrial fibrillation Morbid obesity Hypothyroidism History of bronchial asthma Plan Continue present management Increase ambulation as tolerated  LOS: 6 days    Tracey [name] 12/09/2015, 10:55 AM

## 2015-12-10 LAB — GLUCOSE, CAPILLARY
GLUCOSE-CAPILLARY: 133 mg/dL — AB (ref 65–99)
GLUCOSE-CAPILLARY: 153 mg/dL — AB (ref 65–99)
GLUCOSE-CAPILLARY: 119 mg/dL — AB (ref 65–99)
Glucose-Capillary: 115 mg/dL — ABNORMAL HIGH (ref 65–99)

## 2015-12-10 LAB — PROTIME-INR
INR: 2.16 — ABNORMAL HIGH (ref 0.00–1.49)
PROTHROMBIN TIME: 23.9 s — AB (ref 11.6–15.2)

## 2015-12-10 MED ORDER — WARFARIN SODIUM 5 MG PO TABS
5.0000 mg | ORAL_TABLET | Freq: Once | ORAL | Status: AC
  Administered 2015-12-10: 5 mg via ORAL
  Filled 2015-12-10: qty 1

## 2015-12-10 NOTE — Progress Notes (Signed)
Ref: Oliver Barre, MD   Subjective:  Productive cough yesterday. Less cough today. Afebrile.  Objective:  Vital Signs in the last 24 hours: Temp:  [97.6 F (36.4 C)-97.9 F (36.6 C)] 97.7 F (36.5 C) (01/23 1155) Pulse Rate:  [74-107] 86 (01/23 1155) Cardiac Rhythm:  [-] Atrial fibrillation (01/23 0820) Resp:  [15-27] 21 (01/23 1155) BP: (94-115)/(48-77) 94/48 mmHg (01/23 1155) SpO2:  [88 %-100 %] 100 % (01/23 1155) Weight:  [104.599 kg (230 lb 9.6 oz)] 104.599 kg (230 lb 9.6 oz) (01/23 0238)  Physical Exam: BP Readings from Last 1 Encounters:  12/10/15 94/48    Wt Readings from Last 1 Encounters:  12/10/15 104.599 kg (230 lb 9.6 oz)    Weight change: -1.501 kg (-3 lb 4.9 oz)  HEENT: [place]/AT, Eyes-Blue, PERL, EOMI, Conjunctiva-Pale pink, Sclera-Non-icteric Neck: No JVD, No bruit, Trachea midline. Lungs:  Clearing, Bilateral. Cardiac:  Irregular rhythm, normal S1 and S2, no S3. II/VI systolic murmur Abdomen:  Soft, non-tender. Extremities:  Trace edema present. No cyanosis. No clubbing. Healing RLE wound. CNS: AxOx3, Cranial nerves grossly intact, moves all 4 extremities. Right handed. Skin: Warm and dry.   Intake/Output from previous day: 01/22 0701 - 01/23 0700 In: 580 [P.O.:580] Out: 1326 [Urine:1325; Stool:1]    Lab Results: BMET    Component Value Date/Time   NA 140 12/08/2015 0510   NA 141 12/07/2015 0446   NA 140 12/06/2015 0430   K 4.3 12/08/2015 0510   K 3.6 12/07/2015 0446   K 4.3 12/06/2015 0430   CL 99* 12/08/2015 0510   CL 97* 12/07/2015 0446   CL 97* 12/06/2015 0430   CO2 34* 12/08/2015 0510   CO2 33* 12/07/2015 0446   CO2 33* 12/06/2015 0430   GLUCOSE 127* 12/08/2015 0510   GLUCOSE 112* 12/07/2015 0446   GLUCOSE 116* 12/06/2015 0430   BUN 29* 12/08/2015 0510   BUN 30* 12/07/2015 0446   BUN 33* 12/06/2015 0430   CREATININE 0.91 12/08/2015 0510   CREATININE 0.94 12/07/2015 0446   CREATININE 1.15* 12/06/2015 0430   CALCIUM 8.3* 12/08/2015 0510    CALCIUM 8.1* 12/07/2015 0446   CALCIUM 7.6* 12/06/2015 0430   GFRNONAA >60 12/08/2015 0510   GFRNONAA 58* 12/07/2015 0446   GFRNONAA 45* 12/06/2015 0430   GFRAA >60 12/08/2015 0510   GFRAA >60 12/07/2015 0446   GFRAA 53* 12/06/2015 0430   CBC    Component Value Date/Time   WBC 10.5 12/09/2015 0550   RBC 4.19 12/09/2015 0550   HGB 10.5* 12/09/2015 0550   HCT 34.6* 12/09/2015 0550   PLT 578* 12/09/2015 0550   MCV 82.6 12/09/2015 0550   MCH 25.1* 12/09/2015 0550   MCHC 30.3 12/09/2015 0550   RDW 18.5* 12/09/2015 0550   LYMPHSABS 1.3 12/03/2015 1416   MONOABS 1.2* 12/03/2015 1416   EOSABS 0.3 12/03/2015 1416   BASOSABS 0.1 12/03/2015 1416   HEPATIC Function Panel  Recent Labs  03/30/15 1130 09/28/15 1108 12/03/15 1416  PROT 7.8 7.6 7.0   HEMOGLOBIN A1C No components found for: HGA1C,  MPG CARDIAC ENZYMES Lab Results  Component Value Date   TROPONINI <0.03 12/04/2015   TROPONINI <0.03 12/03/2015   TROPONINI <0.03 12/03/2015   BNP No results for input(s): PROBNP in the last 8760 hours. TSH  Recent Labs  03/30/15 1130 12/04/15 1107  TSH 1.51 3.298   CHOLESTEROL  Recent Labs  03/30/15 1130 09/28/15 1108  CHOL 237* 186    Scheduled Meds: . antiseptic oral rinse  7 mL Mouth Rinse BID  . carvedilol  6.25 mg Oral BID WC  . clopidogrel  75 mg Oral Daily  . escitalopram  20 mg Oral QHS  . feeding supplement (PRO-STAT SUGAR FREE 64)  60 mL Oral BID  . insulin aspart  1-3 Units Subcutaneous TID AC & HS  . levothyroxine  100 mcg Oral QAC breakfast  . pantoprazole  40 mg Oral Daily  . pravastatin  40 mg Oral q1800  . ramipril  5 mg Oral Daily  . spironolactone  12.5 mg Oral Daily  . warfarin  5 mg Oral ONCE-1800  . Warfarin - Pharmacist Dosing Inpatient   Does not apply q1800   Continuous Infusions:  PRN Meds:.acetaminophen, ALPRAZolam, ondansetron (ZOFRAN) IV, sodium chloride, sodium chloride  Assessment/Plan: Right lung aspiration pneumonia Acute  on chronic hypercarbic respiratory failure Acute Hypoglycemic attack Acute on chronic left heart systolic failure Acute metabolic encephalopathy secondary to hypercarbia and shock  Acute renal failure Hypertension  DM, II  Obesity  Hyperlipidemia  Atrial fibrillation  Asthma, chronic Hypothyroidism CAD S/P stent in RCA  Increase activity.     LOS: 7 days    Orpah Cobb  MD  12/10/2015, 3:14 PM

## 2015-12-10 NOTE — Progress Notes (Signed)
ANTICOAGULATION CONSULT NOTE - Follow-Up Consult  Pharmacy Consult for warfarin Indication: atrial fibrillation  Allergies  Allergen Reactions  . Tramadol Other (See Comments)    Severe confusion  . Codeine Nausea And Vomiting  . Latex     Rash  . Doxycycline Itching and Rash    Patient Measurements: Height:  (160 cm) Weight: 230 lb 9.6 oz (104.599 kg) IBW/kg (Calculated) : 52.4  Vital Signs: Temp: 97.9 F (36.6 C) (01/23 0726) Temp Source: Axillary (01/23 0726) BP: 107/64 mmHg (01/23 0943) Pulse Rate: 83 (01/23 0851)  Labs:  Recent Labs  12/08/15 0510 12/09/15 0550 12/10/15 0501  HGB 9.9* 10.5*  --   HCT 35.7* 34.6*  --   PLT 253 578*  --   LABPROT 22.8* 22.7* 23.9*  INR 2.02* 2.02* 2.16*  CREATININE 0.91  --   --     Estimated Creatinine Clearance: 61.8 mL/min (by C-G formula based on Cr of 0.91).   Medical History: Past Medical History  Diagnosis Date  . CHF (congestive heart failure) (HCC)   . Atrial fibrillation (HCC)   . Hypertension   . Thyroid disease   . Coronary artery disease   . Hypothyroidism   . Anxiety   . Shortness of breath     WITH MY CHF  . Anemia   . ACS (acute coronary syndrome) (HCC) 10/27/2013  . Diverticulosis 10/22/2005    Qualifier: Diagnosis of  By: Koleen Distance CMA (AAMA), Hulan Saas    . HIATAL HERNIA 10/22/2005    Qualifier: Diagnosis of  By: Koleen Distance CMA (AAMA), Hulan Saas    . HYPERCHOLESTEROLEMIA 03/09/2008    Qualifier: Diagnosis of  By: Koleen Distance CMA (AAMA), Hulan Saas    . Type II diabetes mellitus (HCC)   . History of blood transfusion     "related to hemorrhaging w/hysterectomy"  . Arthritis     "back; right knee" (12/03/2015)    Medications:  Prescriptions prior to admission  Medication Sig Dispense Refill Last Dose  . ALPRAZolam (XANAX) 0.5 MG tablet TAKE 1 TABLET BY MOUTH TWICE A DAY AS NEEDED FOR ANXIETY (Patient taking differently: Take 0.5 mg by mouth daily at bedtime.) 60 tablet 1 12/02/2015 at Unknown time  .  carvedilol (COREG) 6.25 MG tablet Take 1 tablet (6.25 mg total) by mouth 2 (two) times daily with a meal. 60 tablet 3 12/03/2015 at 830  . clopidogrel (PLAVIX) 75 MG tablet Take 1 tablet (75 mg total) by mouth daily. 30 tablet 3 12/03/2015 at 830  . diltiazem (CARDIZEM) 30 MG tablet Take 30 mg by mouth 2 (two) times daily.    12/03/2015 at Unknown time  . escitalopram (LEXAPRO) 20 MG tablet Take 1 tablet (20 mg total) by mouth daily. 90 tablet 3 12/03/2015 at Unknown time  . furosemide (LASIX) 40 MG tablet Take 1 tablet (40 mg total) by mouth 2 (two) times daily. (Patient taking differently: Take 40 mg by mouth 3 (three) times daily. ) 60 tablet 3 12/03/2015 at Unknown time  . glipiZIDE (GLUCOTROL) 10 MG tablet TAKE 1 TABLET (10 MG TOTAL) BY MOUTH 2 (TWO) TIMES DAILY BEFORE A MEAL. 60 tablet 10 12/03/2015 at Unknown time  . Insulin Glargine (LANTUS SOLOSTAR) 100 UNIT/ML Solostar Pen Inject 15 Units into the skin 2 (two) times daily. 15 mL 11 12/03/2015 at Unknown time  . levothyroxine (SYNTHROID, LEVOTHROID) 75 MCG tablet Take 1 tablet (75 mcg total) by mouth daily before breakfast. 90 tablet 3 12/03/2015 at Unknown time  . lovastatin (MEVACOR) 40 MG  tablet Take 1 tablet (40 mg total) by mouth every other day. 45 tablet 3 12/01/2015 at pm  . metFORMIN (GLUCOPHAGE) 500 MG tablet Take 500 mg by mouth 2 (two) times daily with a meal.   12/03/2015 at Unknown time  . metoprolol tartrate (LOPRESSOR) 25 MG tablet Take 25 mg by mouth at bedtime.    12/02/2015 at 2300  . Multiple Vitamin (MULTIVITAMIN WITH MINERALS) TABS tablet Take 1 tablet by mouth daily.   12/03/2015 at Unknown time  . oxyCODONE-acetaminophen (ROXICET) 5-325 MG tablet Take 0.5-1 tablets by mouth every 8 (eight) hours as needed for severe pain. 5 tablet 0 12/02/2015 at pm  . ramipril (ALTACE) 10 MG capsule Take 1 capsule (10 mg total) by mouth daily. 90 capsule 3 12/03/2015 at Unknown time  . warfarin (COUMADIN) 5 MG tablet Take 5 mg by mouth every  evening.   12/02/2015 at 2300  . diclofenac sodium (VOLTAREN) 1 % GEL Apply 4 g topically 4 (four) times daily. As needed for pain (Patient not taking: Reported on 12/03/2015) 400 g 5 Not Taking at Unknown time  . doxycycline (VIBRA-TABS) 100 MG tablet Take 1 tablet (100 mg total) by mouth 2 (two) times daily. (Patient not taking: Reported on 12/03/2015) 20 tablet 0 Completed Course at Unknown time  . spironolactone (ALDACTONE) 25 MG tablet Take 1 tablet (25 mg total) by mouth daily. (Patient not taking: Reported on 12/03/2015) 30 tablet 3 Not Taking at Unknown time    Assessment: 75 yo F w/ PMH of CHF, afib, DM, HTN, CAD presenting with CHF exacerbation. Afib warf PTA INR admit 2.17, On home warf  qd, last home dose reported 1/15, INR remains therapeutic at 2.16 (note previously we were decreasing home dose d/t rapid INR increases)   Goal of Therapy:  INR 2-3 Monitor platelets by anticoagulation protocol: Yes   Plan:  -Give coumadin  PO x 1 tonight. If INR remains stable tomorrow, likely could decrease frequency of INR monitoring  -Monitor daily INR, CBC, s/s of bleed  [name], PharmD., BCPS Clinical Pharmacist Pager [phone]

## 2015-12-10 NOTE — Care Management Important Message (Signed)
Important Message  Patient Details  Name: Tracey [name] MRN: [id number] Date of Birth: 1941-09-01   Medicare Important Message Given:  Yes    Story Vanvranken P Bogdan Vivona 12/10/2015, 1:57 PM

## 2015-12-10 NOTE — Progress Notes (Signed)
Physical Therapy Treatment Patient Details Name: Tracey [name] MRN: [id number] DOB: 1940/11/21 Today's Date: 12/10/2015    History of Present Illness 75 year old female past medical history CHF, A-fib, HTN, CAD, DM with direct admit with complaints of one-month history of weight gain, leg edema, shortness of breath. Per MD note the day after admission patient again had deteriorating mental status after eating lunch and vomiting. ABG was obtained and showed profound hypercarbic respiratory failure and respiratory acidosis and transferred to ICU intubated 1/16-1/19.    PT Comments    Pt admitted with above diagnosis. Pt currently with functional limitations due to balance and endurance deficits.  Pt was able to ambulate with one sitting rest break today with overall good stability with RW.  Will continue PT as pt tolerates.   Pt will benefit from skilled PT to increase their independence and safety with mobility to allow discharge to the venue listed below.    Follow Up Recommendations  SNF;Supervision/Assistance - 24 hour     Equipment Recommendations  None recommended by PT    Recommendations for Other Services       Precautions / Restrictions Precautions Precautions: Fall Restrictions Weight Bearing Restrictions: No    Mobility  Bed Mobility Overal bed mobility: Needs Assistance Bed Mobility: Supine to Sit     Supine to sit: Min assist     General bed mobility comments: Assist for elevation of trunk  Transfers Overall transfer level: Needs assistance Equipment used: Rolling walker (2 wheeled) Transfers: Sit to/from Stand Sit to Stand: Mod assist;Min assist;+2 safety/equipment         General transfer comment: with cues and UE support  Ambulation/Gait Ambulation/Gait assistance: Min guard Ambulation Distance (Feet): 130 Feet (80 feet then 50 feet) Assistive device: Rolling walker (2 wheeled) Gait Pattern/deviations: Step-through pattern;Decreased stride length    Gait velocity interpretation: Below normal speed for age/gender General Gait Details: Pt ambulated with RW with cues to stay close to RW.  Pt needed cues with turns and steadying assist throughout.  Pt had to have one sitting rest break during her walk.  Rested for 3 minuites and then ambulated back to room.    Stairs            Wheelchair Mobility    Modified Rankin (Stroke Patients Only)       Balance Overall balance assessment: Needs assistance;History of Falls Sitting-balance support: No upper extremity supported;Feet supported Sitting balance-Leahy Scale: Fair     Standing balance support: Bilateral upper extremity supported;During functional activity Standing balance-Leahy Scale: Poor Standing balance comment: Relies on UE support for balance.                    Cognition Arousal/Alertness: Awake/alert Behavior During Therapy: WFL for tasks assessed/performed Overall Cognitive Status: Within Functional Limits for tasks assessed                      Exercises General Exercises - Lower Extremity Long Arc Quad: AROM;Both;10 reps;Seated    General Comments        Pertinent Vitals/Pain Pain Assessment: No/denies pain  Sats 90-93% on 1L on arrival.  Sats 87-92% on RA with ambulation. Replaced O2 at end of session with sats 90-93%.    Home Living                      Prior Function            PT Goals (current goals can  now be found in the care plan section) Progress towards PT goals: Progressing toward goals    Frequency  Min 3X/week    PT Plan Current plan remains appropriate    Co-evaluation             End of Session Equipment Utilized During Treatment: Gait belt;Oxygen Activity Tolerance: Patient limited by fatigue Patient left: in bed;with call bell/phone within reach;with bed alarm set     Time: 1125-1144 PT Time Calculation (min) (ACUTE ONLY): 19 min  Charges:  $Gait Training: 8-22 mins                     G CodesBerline Lopes Jan 07, 2016, 3:23 PM  [facility] [phone] (pager)

## 2015-12-11 LAB — GLUCOSE, CAPILLARY
GLUCOSE-CAPILLARY: 115 mg/dL — AB (ref 65–99)
GLUCOSE-CAPILLARY: 158 mg/dL — AB (ref 65–99)
Glucose-Capillary: 120 mg/dL — ABNORMAL HIGH (ref 65–99)

## 2015-12-11 LAB — PROTIME-INR
INR: 2.17 — AB (ref 0.00–1.49)
Prothrombin Time: 24 seconds — ABNORMAL HIGH (ref 11.6–15.2)

## 2015-12-11 MED ORDER — FUROSEMIDE 40 MG PO TABS
40.0000 mg | ORAL_TABLET | Freq: Every day | ORAL | Status: DC
  Administered 2015-12-11 – 2015-12-12 (×2): 40 mg via ORAL
  Filled 2015-12-11 (×2): qty 1

## 2015-12-11 MED ORDER — WARFARIN SODIUM 5 MG PO TABS
5.0000 mg | ORAL_TABLET | Freq: Every day | ORAL | Status: AC
  Administered 2015-12-11: 5 mg via ORAL
  Filled 2015-12-11: qty 1

## 2015-12-11 NOTE — Progress Notes (Signed)
Ref: Oliver Barre, MD   Subjective:  Oxygen level 86 to 88 % on room air. Afebrile with no antibiotics x 48 hours.  Objective:  Vital Signs in the last 24 hours: Temp:  [97.6 F (36.4 C)-98.7 F (37.1 C)] 97.8 F (36.6 C) (01/24 1800) Pulse Rate:  [70-96] 84 (01/24 1800) Cardiac Rhythm:  [-] Atrial fibrillation;Bundle branch block (01/24 1924) Resp:  [16-23] 22 (01/24 0900) BP: (119-134)/(65-96) 119/80 mmHg (01/24 1111) SpO2:  [97 %-100 %] 100 % (01/24 1800) Weight:  [105.1 kg (231 lb 11.3 oz)] 105.1 kg (231 lb 11.3 oz) (01/24 0500)  Physical Exam: BP Readings from Last 1 Encounters:  12/11/15 119/80    Wt Readings from Last 1 Encounters:  12/11/15 105.1 kg (231 lb 11.3 oz)    Weight change: 0.501 kg (1 lb 1.7 oz)  HEENT: [place]/AT, Eyes-Blue, PERL, EOMI, Conjunctiva-Pale pink, Sclera-Non-icteric Neck: No JVD, No bruit, Trachea midline. Lungs:  Clearing, Bilateral. Cardiac:  Regular rhythm, normal S1 and S2, no S3. II/VI systolic murmur Abdomen:  Soft, non-tender. Extremities:  Trace edema present. No cyanosis. No clubbing. Healing right lower leg wound. CNS: AxOx3, Cranial nerves grossly intact, moves all 4 extremities. Right handed. Skin: Warm and dry.   Intake/Output from previous day: 01/23 0701 - 01/24 0700 In: 720 [P.O.:720] Out: 475 [Urine:475]    Lab Results: BMET    Component Value Date/Time   NA 140 12/08/2015 0510   NA 141 12/07/2015 0446   NA 140 12/06/2015 0430   K 4.3 12/08/2015 0510   K 3.6 12/07/2015 0446   K 4.3 12/06/2015 0430   CL 99* 12/08/2015 0510   CL 97* 12/07/2015 0446   CL 97* 12/06/2015 0430   CO2 34* 12/08/2015 0510   CO2 33* 12/07/2015 0446   CO2 33* 12/06/2015 0430   GLUCOSE 127* 12/08/2015 0510   GLUCOSE 112* 12/07/2015 0446   GLUCOSE 116* 12/06/2015 0430   BUN 29* 12/08/2015 0510   BUN 30* 12/07/2015 0446   BUN 33* 12/06/2015 0430   CREATININE 0.91 12/08/2015 0510   CREATININE 0.94 12/07/2015 0446   CREATININE 1.15*  12/06/2015 0430   CALCIUM 8.3* 12/08/2015 0510   CALCIUM 8.1* 12/07/2015 0446   CALCIUM 7.6* 12/06/2015 0430   GFRNONAA >60 12/08/2015 0510   GFRNONAA 58* 12/07/2015 0446   GFRNONAA 45* 12/06/2015 0430   GFRAA >60 12/08/2015 0510   GFRAA >60 12/07/2015 0446   GFRAA 53* 12/06/2015 0430   CBC    Component Value Date/Time   WBC 10.5 12/09/2015 0550   RBC 4.19 12/09/2015 0550   HGB 10.5* 12/09/2015 0550   HCT 34.6* 12/09/2015 0550   PLT 578* 12/09/2015 0550   MCV 82.6 12/09/2015 0550   MCH 25.1* 12/09/2015 0550   MCHC 30.3 12/09/2015 0550   RDW 18.5* 12/09/2015 0550   LYMPHSABS 1.3 12/03/2015 1416   MONOABS 1.2* 12/03/2015 1416   EOSABS 0.3 12/03/2015 1416   BASOSABS 0.1 12/03/2015 1416   HEPATIC Function Panel  Recent Labs  03/30/15 1130 09/28/15 1108 12/03/15 1416  PROT 7.8 7.6 7.0   HEMOGLOBIN A1C No components found for: HGA1C,  MPG CARDIAC ENZYMES Lab Results  Component Value Date   TROPONINI <0.03 12/04/2015   TROPONINI <0.03 12/03/2015   TROPONINI <0.03 12/03/2015   BNP No results for input(s): PROBNP in the last 8760 hours. TSH  Recent Labs  03/30/15 1130 12/04/15 1107  TSH 1.51 3.298   CHOLESTEROL  Recent Labs  03/30/15 1130 09/28/15 1108  CHOL  237* 186    Scheduled Meds: . antiseptic oral rinse  7 mL Mouth Rinse BID  . carvedilol  6.25 mg Oral BID WC  . clopidogrel  75 mg Oral Daily  . escitalopram  20 mg Oral QHS  . feeding supplement (PRO-STAT SUGAR FREE 64)  60 mL Oral BID  . furosemide  40 mg Oral Daily  . insulin aspart  1-3 Units Subcutaneous TID AC & HS  . levothyroxine  100 mcg Oral QAC breakfast  . pantoprazole  40 mg Oral Daily  . pravastatin  40 mg Oral q1800  . ramipril  5 mg Oral Daily  . spironolactone  12.5 mg Oral Daily  . Warfarin - Pharmacist Dosing Inpatient   Does not apply q1800   Continuous Infusions:  PRN Meds:.acetaminophen, ALPRAZolam, ondansetron (ZOFRAN) IV, sodium chloride, sodium  chloride  Assessment/Plan: Right lung aspiration pneumonia Acute on chronic hypercarbic respiratory failure Acute Hypoglycemic attack Acute on chronic left heart systolic failure Acute metabolic encephalopathy secondary to hypercarbia and shock  Acute renal failure Hypertension  DM, II  Obesity  Hyperlipidemia  Atrial fibrillation  Asthma, chronic Hypothyroidism CAD S/P stent in RCA Chronic right lower leg wound  Resume lasix. Increase activity. SNF tomorrow if stable.     LOS: 8 days    Orpah Cobb  MD  12/11/2015, 7:44 PM

## 2015-12-11 NOTE — Progress Notes (Signed)
ANTICOAGULATION CONSULT NOTE - Follow-Up Consult  Pharmacy Consult for warfarin Indication: atrial fibrillation  Allergies  Allergen Reactions  . Tramadol Other (See Comments)    Severe confusion  . Codeine Nausea And Vomiting  . Latex     Rash  . Doxycycline Itching and Rash    Patient Measurements: Height:  (160 cm) Weight: 231 lb 11.3 oz (105.1 kg) IBW/kg (Calculated) : 52.4  Vital Signs: Temp: 97.9 F (36.6 C) (01/24 0809) Temp Source: Oral (01/24 0809) BP: 119/80 mmHg (01/24 1111) Pulse Rate: 90 (01/24 0848)  Labs:  Recent Labs  12/09/15 0550 12/10/15 0501 12/11/15 0445  HGB 10.5*  --   --   HCT 34.6*  --   --   PLT 578*  --   --   LABPROT 22.7* 23.9* 24.0*  INR 2.02* 2.16* 2.17*    Estimated Creatinine Clearance: 62 mL/min (by C-G formula based on Cr of 0.91).   Medical History: Past Medical History  Diagnosis Date  . CHF (congestive heart failure) (HCC)   . Atrial fibrillation (HCC)   . Hypertension   . Thyroid disease   . Coronary artery disease   . Hypothyroidism   . Anxiety   . Shortness of breath     WITH MY CHF  . Anemia   . ACS (acute coronary syndrome) (HCC) 10/27/2013  . Diverticulosis 10/22/2005    Qualifier: Diagnosis of  By: Koleen Distance CMA (AAMA), Hulan Saas    . HIATAL HERNIA 10/22/2005    Qualifier: Diagnosis of  By: Koleen Distance CMA (AAMA), Hulan Saas    . HYPERCHOLESTEROLEMIA 03/09/2008    Qualifier: Diagnosis of  By: Koleen Distance CMA (AAMA), Hulan Saas    . Type II diabetes mellitus (HCC)   . History of blood transfusion     "related to hemorrhaging w/hysterectomy"  . Arthritis     "back; right knee" (12/03/2015)    Medications:  Prescriptions prior to admission  Medication Sig Dispense Refill Last Dose  . ALPRAZolam (XANAX) 0.5 MG tablet TAKE 1 TABLET BY MOUTH TWICE A DAY AS NEEDED FOR ANXIETY (Patient taking differently: Take 0.5 mg by mouth daily at bedtime.) 60 tablet 1 12/02/2015 at Unknown time  . carvedilol (COREG) 6.25 MG tablet Take 1  tablet (6.25 mg total) by mouth 2 (two) times daily with a meal. 60 tablet 3 12/03/2015 at 830  . clopidogrel (PLAVIX) 75 MG tablet Take 1 tablet (75 mg total) by mouth daily. 30 tablet 3 12/03/2015 at 830  . diltiazem (CARDIZEM) 30 MG tablet Take 30 mg by mouth 2 (two) times daily.    12/03/2015 at Unknown time  . escitalopram (LEXAPRO) 20 MG tablet Take 1 tablet (20 mg total) by mouth daily. 90 tablet 3 12/03/2015 at Unknown time  . furosemide (LASIX) 40 MG tablet Take 1 tablet (40 mg total) by mouth 2 (two) times daily. (Patient taking differently: Take 40 mg by mouth 3 (three) times daily. ) 60 tablet 3 12/03/2015 at Unknown time  . glipiZIDE (GLUCOTROL) 10 MG tablet TAKE 1 TABLET (10 MG TOTAL) BY MOUTH 2 (TWO) TIMES DAILY BEFORE A MEAL. 60 tablet 10 12/03/2015 at Unknown time  . Insulin Glargine (LANTUS SOLOSTAR) 100 UNIT/ML Solostar Pen Inject 15 Units into the skin 2 (two) times daily. 15 mL 11 12/03/2015 at Unknown time  . levothyroxine (SYNTHROID, LEVOTHROID) 75 MCG tablet Take 1 tablet (75 mcg total) by mouth daily before breakfast. 90 tablet 3 12/03/2015 at Unknown time  . lovastatin (MEVACOR) 40 MG tablet Take 1  tablet (40 mg total) by mouth every other day. 45 tablet 3 12/01/2015 at pm  . metFORMIN (GLUCOPHAGE) 500 MG tablet Take 500 mg by mouth 2 (two) times daily with a meal.   12/03/2015 at Unknown time  . metoprolol tartrate (LOPRESSOR) 25 MG tablet Take 25 mg by mouth at bedtime.    12/02/2015 at 2300  . Multiple Vitamin (MULTIVITAMIN WITH MINERALS) TABS tablet Take 1 tablet by mouth daily.   12/03/2015 at Unknown time  . oxyCODONE-acetaminophen (ROXICET) 5-325 MG tablet Take 0.5-1 tablets by mouth every 8 (eight) hours as needed for severe pain. 5 tablet 0 12/02/2015 at pm  . ramipril (ALTACE) 10 MG capsule Take 1 capsule (10 mg total) by mouth daily. 90 capsule 3 12/03/2015 at Unknown time  . warfarin (COUMADIN) 5 MG tablet Take 5 mg by mouth every evening.   12/02/2015 at 2300  . diclofenac  sodium (VOLTAREN) 1 % GEL Apply 4 g topically 4 (four) times daily. As needed for pain (Patient not taking: Reported on 12/03/2015) 400 g 5 Not Taking at Unknown time  . doxycycline (VIBRA-TABS) 100 MG tablet Take 1 tablet (100 mg total) by mouth 2 (two) times daily. (Patient not taking: Reported on 12/03/2015) 20 tablet 0 Completed Course at Unknown time  . spironolactone (ALDACTONE) 25 MG tablet Take 1 tablet (25 mg total) by mouth daily. (Patient not taking: Reported on 12/03/2015) 30 tablet 3 Not Taking at Unknown time    Assessment: 75 yo F w/ PMH of CHF, afib, DM, HTN, CAD presenting with CHF exacerbation. Afib warf PTA INR admit 2.17, On home warf  qd, last home dose reported 1/15, INR remains stable and therapeutic at 2.17   Goal of Therapy:  INR 2-3 Monitor platelets by anticoagulation protocol: Yes   Plan:  -Coumadin 5 mg daily  -Monitor Q 48 hr INR, CBC, s/s of bleed  [name], PharmD., BCPS Clinical Pharmacist Pager [phone]

## 2015-12-11 NOTE — Progress Notes (Signed)
SATURATION QUALIFICATIONS: (This note is used to comply with regulatory documentation for home oxygen)  Patient Saturations on Room Air at Rest = 86%  Patient Saturations on 2 Liters of oxygen   Please briefly explain why patient needs home oxygen:  hypoxia

## 2015-12-11 NOTE — Clinical Social Work Placement (Signed)
   CLINICAL SOCIAL WORK PLACEMENT  NOTE  Date:  12/11/2015  Patient Details  Name: Tracey [name] MRN: [id number] Date of Birth: 11-19-1940  Clinical Social Work is seeking post-discharge placement for this patient at the Skilled  Nursing Facility level of care (*CSW will initial, date and re-position this form in  chart as items are completed):  Yes   Patient/family provided with [facility] Clinical Social Work Department's list of facilities offering this level of care within the geographic area requested by the patient (or if unable, by the patient's family).  Yes   Patient/family informed of their freedom to choose among providers that offer the needed level of care, that participate in Medicare, Medicaid or managed care program needed by the patient, have an available bed and are willing to accept the patient.  Yes   Patient/family informed of [facility]'s ownership interest in [facility] and [facility], as well as of the fact that they are under no obligation to receive care at these facilities.  PASRR submitted to EDS on 12/11/15     PASRR number received on 12/11/15     Existing PASRR number confirmed on       FL2 transmitted to all facilities in geographic area requested by pt/family on 12/11/15     FL2 transmitted to all facilities within larger geographic area on       Patient informed that his/her managed care company has contracts with or will negotiate with certain facilities, including the following:            Patient/family informed of bed offers received.  Patient chooses bed at       Physician recommends and patient chooses bed at      Patient to be transferred to   on  .  Patient to be transferred to facility by       Patient family notified on   of transfer.  Name of family member notified:        PHYSICIAN Please sign FL2     Additional Comment:    _______________________________________________ [name], LCSW 12/11/2015,  2:32 PM

## 2015-12-11 NOTE — Progress Notes (Addendum)
Nutrition Follow-up  DOCUMENTATION CODES:   Obesity unspecified  INTERVENTION:    Continue Prostat liquid protein po 60 ml BID with meals, each supplement provides 100 kcal, 15 grams protein  NEW NUTRITION DIAGNOSIS:   Increased nutrient needs related to wound healing as evidenced by estimated needs, ongoing  GOAL:   Patient will meet greater than or equal to 90% of their needs, progressing  MONITOR:   PO intake, Supplement acceptance, Labs, Weight trends, Skin, I & O's  ASSESSMENT:   75 year old female admitted with dyspnea and weight gain treated with lasix. 1/17 was doing OK, but had episode of nausea/vomiting at lunch, and then had deteriorating MS. ABG showed profound respiratory acidosis.   Patient extubated 1/19. Advanced to Regular diet.   Patient reports she's eating well, however, PO intake variable at 10-50% per flowsheet records. Has Prostat liquid protein ordered 60 ml po BID and she is taking. RD encouraged pt to continue.  Diet Order:  Diet regular Room service appropriate?: Yes; Fluid consistency:: Thin  Skin:  Wound (see comment) (Pressure ulcer stage II lower right leg)  Last BM:  1/16  Height:   Ht Readings from Last 1 Encounters:  12/05/15  (1.6 m)    Weight:   Wt Readings from Last 1 Encounters:  12/11/15 231 lb 11.3 oz (105.1 kg)    Ideal Body Weight:  52.2 kg  BMI:  Body mass index is 41.05 kg/(m^2).  Estimated Nutritional Needs:   Kcal:  1610-9604  Protein:  >104 grams   Fluid:  > 1.5 L/day  EDUCATION NEEDS:   No education needs identified at this time  Maureen Chatters, RD, LDN Pager #: [phone] After-Hours Pager #: [phone]

## 2015-12-11 NOTE — NC FL2 (Signed)
[place] MEDICAID FL2 LEVEL OF CARE SCREENING TOOL     IDENTIFICATION  Patient Name: Tracey [name] Birthdate: [birth year] Sex: female Admission Date (Current Location): 12/03/2015  [facility] and [place] Number:  Producer, television/film/video and Address:  [facility] [place], [address] [place], [place] [address]      Provider Number: [id number]  Attending Physician Name and Address:  Orpah Cobb, MD  Relative Name and Phone Number:       Current Level of Care: Hospital Recommended Level of Care: Skilled Nursing Facility Prior Approval Number:    Date Approved/Denied:   PASRR Number: [id number]  Discharge Plan: SNF    Current Diagnoses: Patient Active Problem List   Diagnosis Date Noted  . Pressure ulcer 12/05/2015  . Acute respiratory failure with hypoxia and hypercarbia (HCC) 12/04/2015  . Acute encephalopathy 12/04/2015  . Shock (HCC)   . Cellulitis 11/16/2015  . Right knee pain 04/03/2015  . Lower back pain 04/03/2015  . Injury of foot, left 08/21/2014  . Acute on chronic left systolic heart failure (HCC) 07/05/2014  . Preventative health care 04/07/2014  . CHF (congestive heart failure) (HCC)   . Atrial fibrillation (HCC)   . Diabetes mellitus without complication (HCC)   . Hypertension   . Coronary artery disease   . Hypothyroidism   . Arthritis   . Anemia   . ACS (acute coronary syndrome) (HCC) 10/27/2013  . THYROID DISORDER 03/09/2008  . HYPERCHOLESTEROLEMIA 03/09/2008  . Anxiety state 03/09/2008  . HIATAL HERNIA 10/22/2005  . DIVERTICULOSIS, COLON 10/22/2005    Orientation RESPIRATION BLADDER Height & Weight    Self, Time, Situation, Place  O2 (2L [place]) Incontinent, Indwelling catheter  (160 cm) 231 lbs.  BEHAVIORAL SYMPTOMS/MOOD NEUROLOGICAL BOWEL NUTRITION STATUS      Incontinent Diet (cardiac)  AMBULATORY STATUS COMMUNICATION OF NEEDS Skin   Limited Assist Verbally PU Stage and Appropriate Care   PU Stage 2 Dressing:  Daily                   Personal Care Assistance Level of Assistance  Bathing, Dressing Bathing Assistance: Limited assistance   Dressing Assistance: Limited assistance     Functional Limitations Info             SPECIAL CARE FACTORS FREQUENCY  PT (By licensed PT)     PT Frequency: 5/wk              Contractures      Additional Factors Info  Code Status, Allergies, Insulin Sliding Scale Code Status Info: FULL Allergies Info: Tramadol, Codeine, Latex, Doxycycline   Insulin Sliding Scale Info: 3/day       Current Medications (12/11/2015):  This is the current hospital active medication list Current Facility-Administered Medications  Medication Dose Route Frequency Provider Last Rate Last Dose  . acetaminophen (TYLENOL) tablet 650 mg  650 mg Oral Q4H PRN Orpah Cobb, MD   650 mg at 12/06/15 1453  . ALPRAZolam Prudy Feeler) tablet 0.25 mg  0.25 mg Oral BID PRN Orpah Cobb, MD   0.25 mg at 12/10/15 2146  . antiseptic oral rinse (CPC / CETYLPYRIDINIUM CHLORIDE 0.05%) solution 7 mL  7 mL Mouth Rinse BID Alyson Reedy, MD   7 mL at 12/11/15 1113  . carvedilol (COREG) tablet 6.25 mg  6.25 mg Oral BID WC Orpah Cobb, MD   6.25 mg at 12/11/15 0848  . clopidogrel (PLAVIX) tablet 75 mg  75 mg Oral Daily Ajay  Algie Coffer, MD   75 mg at 12/11/15 1112  . escitalopram (LEXAPRO) tablet 20 mg  20 mg Oral QHS Alyson Reedy, MD   20 mg at 12/10/15 2146  . feeding supplement (PRO-STAT SUGAR FREE 64) liquid 60 mL  60 mL Oral BID Orpah Cobb, MD   60 mL at 12/11/15 1110  . insulin aspart (novoLOG) injection 1-3 Units  1-3 Units Subcutaneous TID AC & HS Orpah Cobb, MD   2 Units at 12/11/15 1255  . levothyroxine (SYNTHROID, LEVOTHROID) tablet 100 mcg  100 mcg Oral QAC breakfast Orpah Cobb, MD   100 mcg at 12/11/15 0848  . ondansetron (ZOFRAN) injection 4 mg  4 mg Intravenous Q6H PRN Orpah Cobb, MD   4 mg at 12/04/15 1314  . pantoprazole (PROTONIX) EC tablet 40 mg  40 mg Oral Daily Orpah Cobb, MD   40 mg at 12/11/15 1111  . pravastatin (PRAVACHOL) tablet 40 mg  40 mg Oral q1800 Orpah Cobb, MD   40 mg at 12/10/15 1718  . ramipril (ALTACE) capsule 5 mg  5 mg Oral Daily Orpah Cobb, MD   5 mg at 12/11/15 1111  . sodium chloride 0.9 % injection 10-40 mL  10-40 mL Intracatheter PRN Alyson Reedy, MD      . sodium chloride 0.9 % injection 3 mL  3 mL Intravenous PRN Orpah Cobb, MD      . spironolactone (ALDACTONE) tablet 12.5 mg  12.5 mg Oral Daily Orpah Cobb, MD   12.5 mg at 12/11/15 1110  . warfarin (COUMADIN) tablet 5 mg  5 mg Oral q1800 Candis Schatz Mancheril, RPH      . Warfarin - Pharmacist Dosing Inpatient   Does not apply q1800 Johney Maine, [facility]   1 each at 12/07/15 1800     Discharge Medications: Please see discharge summary for a list of discharge medications.  Relevant Imaging Results:  Relevant Lab Results:   Additional Information SS#: [id number]  [name], [place]

## 2015-12-11 NOTE — Clinical Social Work Note (Signed)
Clinical Social Work Assessment  Patient Details  Name: Tracey [name] MRN: [id number] Date of Birth: January 26, 1941  Date of referral:  12/11/15               Reason for consult:  Facility Placement                Permission sought to share information with:  Family Supports Permission granted to share information::  Yes, Verbal Permission Granted  Name::        Agency::  [name]  Relationship::     Contact Information:     Housing/Transportation Living arrangements for the past 2 months:  Single Family Home Source of Information:  Patient Patient Interpreter Needed:  None Criminal Activity/Legal Involvement Pertinent to Current Situation/Hospitalization:  No - Comment as needed Significant Relationships:  Adult Children Lives with:  Self Do you feel safe going back to the place where you live?  No Need for family participation in patient care:  No (Coment)  Care giving concerns:  Pt lives at home alone and her son who lives locally would not be able to be with her 24 hours since he works during the day   Office manager / plan:  CSW spoke with pt concerning PT recommendation for SNF  Employment status:  Retired Database administrator PT Recommendations:  Skilled Nursing Facility Information / Referral to community resources:  Skilled Nursing Facility  Patient/Family's Response to care:  Pt is agreeable to SNF placement and prefers [name]- expresses understanding of concerns with her returning home and benefits of short term rehab  Patient/Family's Understanding of and Emotional Response to Diagnosis, Current Treatment, and Prognosis:  No questions or concerns  Emotional Assessment Appearance:  Appears stated age Attitude/Demeanor/Rapport:    Affect (typically observed):  Appropriate Orientation:  Oriented to Self, Oriented to Place, Oriented to  Time, Oriented to Situation Alcohol / Substance use:  Not Applicable Psych involvement  (Current and /or in the community):     Discharge Needs  Concerns to be addressed:  Care Coordination Readmission within the last 30 days:  No Current discharge risk:  Physical Impairment Barriers to Discharge:  Continued Medical Work up   [employer], [place], LCSW 12/11/2015, 2:25 PM

## 2015-12-11 NOTE — Progress Notes (Signed)
Pt chooses [name] first choice- they can accept pt tomorrow  Pt son to complete paperwork at 1:30pm with [facility]  CSW will continue to follow  Merlyn Lot, [facility] Clinical Social Worker [phone]

## 2015-12-12 LAB — CBC WITH DIFFERENTIAL/PLATELET
BASOS ABS: 0 10*3/uL (ref 0.0–0.1)
BASOS PCT: 0 %
Eosinophils Absolute: 0.3 10*3/uL (ref 0.0–0.7)
Eosinophils Relative: 4 %
HEMATOCRIT: 35.6 % — AB (ref 36.0–46.0)
HEMOGLOBIN: 9.7 g/dL — AB (ref 12.0–15.0)
LYMPHS PCT: 15 %
Lymphs Abs: 1.4 10*3/uL (ref 0.7–4.0)
MCH: 23.7 pg — ABNORMAL LOW (ref 26.0–34.0)
MCHC: 27.2 g/dL — ABNORMAL LOW (ref 30.0–36.0)
MCV: 86.8 fL (ref 78.0–100.0)
MONO ABS: 1.1 10*3/uL — AB (ref 0.1–1.0)
Monocytes Relative: 12 %
NEUTROS ABS: 6.4 10*3/uL (ref 1.7–7.7)
NEUTROS PCT: 69 %
Platelets: 250 10*3/uL (ref 150–400)
RBC: 4.1 MIL/uL (ref 3.87–5.11)
RDW: 16.8 % — ABNORMAL HIGH (ref 11.5–15.5)
WBC: 9.3 10*3/uL (ref 4.0–10.5)

## 2015-12-12 LAB — COMPREHENSIVE METABOLIC PANEL
ALBUMIN: 2.7 g/dL — AB (ref 3.5–5.0)
ALK PHOS: 52 U/L (ref 38–126)
ALT: 11 U/L — AB (ref 14–54)
AST: 10 U/L — AB (ref 15–41)
Anion gap: 13 (ref 5–15)
BILIRUBIN TOTAL: 0.7 mg/dL (ref 0.3–1.2)
BUN: 25 mg/dL — AB (ref 6–20)
CALCIUM: 8.1 mg/dL — AB (ref 8.9–10.3)
CO2: 34 mmol/L — ABNORMAL HIGH (ref 22–32)
CREATININE: 0.84 mg/dL (ref 0.44–1.00)
Chloride: 95 mmol/L — ABNORMAL LOW (ref 101–111)
GFR calc Af Amer: >60 mL/min (ref >60)
GLUCOSE: 137 mg/dL — AB (ref 65–99)
POTASSIUM: 4.3 mmol/L (ref 3.5–5.1)
Sodium: 142 mmol/L (ref 135–145)
TOTAL PROTEIN: 6.4 g/dL — AB (ref 6.5–8.1)

## 2015-12-12 LAB — GLUCOSE, CAPILLARY
GLUCOSE-CAPILLARY: 133 mg/dL — AB (ref 65–99)
Glucose-Capillary: 126 mg/dL — ABNORMAL HIGH (ref 65–99)
Glucose-Capillary: 148 mg/dL — ABNORMAL HIGH (ref 65–99)

## 2015-12-12 MED ORDER — SPIRONOLACTONE 25 MG PO TABS: 12.5000 mg | ORAL_TABLET | Freq: Every day | ORAL | Status: AC

## 2015-12-12 MED ORDER — FUROSEMIDE 40 MG PO TABS: 40.0000 mg | ORAL_TABLET | Freq: Every day | ORAL | Status: AC

## 2015-12-12 MED ORDER — PANTOPRAZOLE SODIUM 40 MG PO TBEC: 40.0000 mg | DELAYED_RELEASE_TABLET | Freq: Every day | ORAL | Status: AC

## 2015-12-12 MED ORDER — LEVOTHYROXINE SODIUM 100 MCG PO TABS: 100.0000 ug | ORAL_TABLET | Freq: Every day | ORAL | Status: AC

## 2015-12-12 MED ORDER — WARFARIN SODIUM 5 MG PO TABS: 5.0000 mg | ORAL_TABLET | Freq: Every day | ORAL | Status: DC

## 2015-12-12 MED ORDER — DILTIAZEM HCL 30 MG PO TABS: 30.0000 mg | ORAL_TABLET | Freq: Every day | ORAL | Status: AC

## 2015-12-12 MED ORDER — PRAVASTATIN SODIUM 40 MG PO TABS: 40.0000 mg | ORAL_TABLET | Freq: Every day | ORAL | Status: AC

## 2015-12-12 MED ORDER — INSULIN GLARGINE 100 UNIT/ML SOLOSTAR PEN: 10.0000 [IU] | PEN_INJECTOR | Freq: Every day | SUBCUTANEOUS | Status: AC

## 2015-12-12 MED ORDER — ALPRAZOLAM 0.5 MG PO TABS: ORAL_TABLET | ORAL | Status: AC

## 2015-12-12 NOTE — Progress Notes (Signed)
PT transferred to [facility] SNF via [facility]. Called report to Jonny Ruiz, Charity fundraiser at facility. Belongings sent with pt and transporters. See vital signs flowsheet.

## 2015-12-12 NOTE — Discharge Summary (Signed)
Physician Discharge Summary  Patient ID: Tracey [name] MRN: [id number] DOB/AGE: 12-22-1940 75 y.o.  Admit date: 12/03/2015 Discharge date: 12/12/2015   Admitting Diagnosis: Acute on chronic left heart systolic failure Hypertension  DM, II  Obesity  Hyperlipidemia  Atrial fibrillation  Asthma, chronic Hypothyroidism CAD S/P stent in RCA Chronic right lower leg wound  Discharge Diagnoses:  Principal Problem:   Acute on chronic left systolic heart failure (HCC)-Stable Active Problems: Right lung aspiration pneumonia-resolving Acute on chronic hypercarbic respiratory failure-resolved Acute Hypoglycemic attack-resolved Acute metabolic encephalopathy secondary to hypercarbia and cardiogenic shock-resolved  Acute renal failure-improved Hypertension-Stable  DM, II  Obesity  Hyperlipidemia  Atrial fibrillation  Asthma, chronic Hypothyroidism CAD S/P stent in RCA Chronic right lower leg wound-Improving  Discharged Condition: fair  Hospital Course: 75 year old female with past medical history of CHF, CAD, DM, II, Obesity, Dyslipidemia, Hypothyroidism, Arthritis and anemia, has 1 month history of weight gain, leg edema and shortness of breath, minimally responding to oral lasix. She was improving in hospital and on Day 2 had acute respiratory failure with hypoglycemia and hypotension with possible aspiration pneumonia. ICU admission, intubation, IV antibiotics and care by CCM helped patient get extubated in 3 days and and transferred to step down unit. After 1 week Ceftriaxone was discontinued, local wound care of right lower leg was continued and lasix was resumed with continued improvement but significant weakness. SNF will be required to build strength back before patient can return to home. Her insulin dose was decreased buy 50 % and glucotrol was discontinued. She will be followed by me in 1 week and primary care in 1 month.  Consults: cardiology and pulmonary/intensive  care  Significant Diagnostic Studies: labs: Low hemoglobin of 10.8. Normal WBC and platelets count. Near normal electrolytes with mildly elevated blood sugars and low albumin level of 2.7 g.  EKG-atrial fibrillation with RBBB, septal infarct and lateral ischemia.  Echocardiogram: Left ventricle: The cavity size was normal. There was mild concentric hypertrophy. Systolic function was mildly reduced. Theestimated ejection fraction was in the range of 45% to 50%. Thereis moderate hypokinesis of the entireanterolateral myocardium. Doppler parameters are consistent with abnormal left ventricularrelaxation (grade 1 diastolic dysfunction). - Aortic valve: There was mild regurgitation. - Mitral valve: There was severe regurgitation. - Left atrium: The atrium was severely dilated. - Right ventricle: The cavity size was mildly dilated. Wallthickness was normal. Systolic function was moderately reduced. - Right atrium: The atrium was severely dilated. - Tricuspid valve: There was moderate regurgitation. - Pulmonary arteries: Systolic pressure was moderately increased. - Pericardium, extracardiac: A trivial pericardial effusion wasidentified.  Chest x-ray: Subtle infiltrate right base. Lungs elsewhere clear. Tube and catheter positions as described without pneumothorax. Stable cardiomegaly.  Treatments: cardiac meds: ramipril (Altace), carvedilol, diltiazem, aspirin, clopidogrel, pravastatin, spironolactone and furosemide.  Discharge Exam: Blood pressure 126/77, pulse 84, temperature 97.8 F (36.6 C), temperature source Oral, resp. rate 22, height  (1.6 m), weight 103.602 kg (228 lb 6.4 oz), SpO2 95 %. HEENT: [place]/AT, Eyes-Blue, PERL, EOMI, Conjunctiva-Pale pink, Sclera-Non-icteric Neck: No JVD, No bruit, Trachea midline. Lungs: Clearing, Bilateral. Cardiac: Regular rhythm, normal S1 and S2, no S3. II/VI systolic murmur Abdomen: Soft, non-tender. Extremities: Trace edema present. No  cyanosis. No clubbing. Healing right lower leg wound. CNS: AxOx3, Cranial nerves grossly intact, moves all 4 extremities. Right handed. Skin: Warm and dry.  Disposition: 01-Home or Self Care     Medication List    STOP taking these medications  diclofenac sodium 1 % Gel  Commonly known as:  VOLTAREN     doxycycline 100 MG tablet  Commonly known as:  VIBRA-TABS     glipiZIDE 10 MG tablet  Commonly known as:  GLUCOTROL     lovastatin 40 MG tablet  Commonly known as:  MEVACOR  Replaced by:  pravastatin 40 MG tablet     metoprolol tartrate 25 MG tablet  Commonly known as:  LOPRESSOR     oxyCODONE-acetaminophen 5-325 MG tablet  Commonly known as:  ROXICET      TAKE these medications        ALPRAZolam 0.5 MG tablet  Commonly known as:  XANAX  Take 0.5 mg by mouth daily at bedtime.     carvedilol 6.25 MG tablet  Commonly known as:  COREG  Take 1 tablet (6.25 mg total) by mouth 2 (two) times daily with a meal.     clopidogrel 75 MG tablet  Commonly known as:  PLAVIX  Take 1 tablet (75 mg total) by mouth daily.     diltiazem 30 MG tablet  Commonly known as:  CARDIZEM  Take 1 tablet (30 mg total) by mouth daily with breakfast.     escitalopram 20 MG tablet  Commonly known as:  LEXAPRO  Take 1 tablet (20 mg total) by mouth daily.     furosemide 40 MG tablet  Commonly known as:  LASIX  Take 1 tablet (40 mg total) by mouth daily with breakfast.     Insulin Glargine 100 UNIT/ML Solostar Pen  Commonly known as:  LANTUS SOLOSTAR  Inject 10 Units into the skin at bedtime.     levothyroxine 100 MCG tablet  Commonly known as:  SYNTHROID, LEVOTHROID  Take 1 tablet (100 mcg total) by mouth daily before breakfast.     metFORMIN 500 MG tablet  Commonly known as:  GLUCOPHAGE  Take 500 mg by mouth 2 (two) times daily with a meal.     multivitamin with minerals Tabs tablet  Take 1 tablet by mouth daily.     pantoprazole 40 MG tablet  Commonly known as:  PROTONIX   Take 1 tablet (40 mg total) by mouth daily.     pravastatin 40 MG tablet  Commonly known as:  PRAVACHOL  Take 1 tablet (40 mg total) by mouth daily at 6 PM.     ramipril 10 MG capsule  Commonly known as:  ALTACE  Take 1 capsule (10 mg total) by mouth daily.     spironolactone 25 MG tablet  Commonly known as:  ALDACTONE  Take 0.5 tablets (12.5 mg total) by mouth daily.     warfarin 5 MG tablet  Commonly known as:  COUMADIN  Take 5 mg by mouth every evening.           Follow-up Information    Follow up with Oliver Barre, MD. Schedule an appointment as soon as possible for a visit in 1 month.   Specialties:  Internal Medicine, Radiology   Contact information:   [address] Maggie Schwalbe [facility] [place] [address] [phone]       Follow up with [facility] S, MD. Schedule an appointment as soon as possible for a visit in 1 week.   Specialty:  Cardiology   Contact information:   [address] [place] [address] [phone]       Signed: Ricki Rodriguez 12/12/2015, 1:01 PM

## 2015-12-13 ENCOUNTER — Telehealth: Payer: Self-pay | Admitting: *Deleted

## 2015-12-13 NOTE — Telephone Encounter (Signed)
Pt was on tcm list was D/C 12/12/15, but sent to SNF...Raechel Chute

## 2015-12-21 ENCOUNTER — Telehealth: Payer: Self-pay | Admitting: Internal Medicine

## 2015-12-21 NOTE — Telephone Encounter (Signed)
Patient is being discharged from [facility].  Would like to know if Dr. Jonny Ruiz will be overseeing patient care?

## 2015-12-31 ENCOUNTER — Emergency Department (HOSPITAL_COMMUNITY): Payer: Commercial Managed Care - HMO

## 2015-12-31 ENCOUNTER — Encounter (HOSPITAL_COMMUNITY): Payer: Self-pay

## 2015-12-31 ENCOUNTER — Inpatient Hospital Stay (HOSPITAL_COMMUNITY)
Admission: EM | Admit: 2015-12-31 | Discharge: 2016-01-16 | DRG: 291 | Disposition: E | Payer: Commercial Managed Care - HMO | Attending: Cardiovascular Disease | Admitting: Cardiovascular Disease

## 2015-12-31 DIAGNOSIS — K449 Diaphragmatic hernia without obstruction or gangrene: Secondary | ICD-10-CM | POA: Diagnosis present

## 2015-12-31 DIAGNOSIS — Z91040 Latex allergy status: Secondary | ICD-10-CM | POA: Diagnosis not present

## 2015-12-31 DIAGNOSIS — R0602 Shortness of breath: Secondary | ICD-10-CM | POA: Diagnosis present

## 2015-12-31 DIAGNOSIS — Z66 Do not resuscitate: Secondary | ICD-10-CM | POA: Diagnosis not present

## 2015-12-31 DIAGNOSIS — J9622 Acute and chronic respiratory failure with hypercapnia: Secondary | ICD-10-CM | POA: Diagnosis present

## 2015-12-31 DIAGNOSIS — Z87891 Personal history of nicotine dependence: Secondary | ICD-10-CM

## 2015-12-31 DIAGNOSIS — I110 Hypertensive heart disease with heart failure: Secondary | ICD-10-CM | POA: Diagnosis present

## 2015-12-31 DIAGNOSIS — E039 Hypothyroidism, unspecified: Secondary | ICD-10-CM | POA: Diagnosis present

## 2015-12-31 DIAGNOSIS — Z515 Encounter for palliative care: Secondary | ICD-10-CM | POA: Diagnosis present

## 2015-12-31 DIAGNOSIS — I4891 Unspecified atrial fibrillation: Secondary | ICD-10-CM | POA: Diagnosis present

## 2015-12-31 DIAGNOSIS — I5023 Acute on chronic systolic (congestive) heart failure: Secondary | ICD-10-CM | POA: Diagnosis present

## 2015-12-31 DIAGNOSIS — Z79899 Other long term (current) drug therapy: Secondary | ICD-10-CM

## 2015-12-31 DIAGNOSIS — Z888 Allergy status to other drugs, medicaments and biological substances status: Secondary | ICD-10-CM | POA: Diagnosis not present

## 2015-12-31 DIAGNOSIS — Z7901 Long term (current) use of anticoagulants: Secondary | ICD-10-CM

## 2015-12-31 DIAGNOSIS — Z7902 Long term (current) use of antithrombotics/antiplatelets: Secondary | ICD-10-CM

## 2015-12-31 DIAGNOSIS — Z881 Allergy status to other antibiotic agents status: Secondary | ICD-10-CM | POA: Diagnosis not present

## 2015-12-31 DIAGNOSIS — E7800 Pure hypercholesterolemia, unspecified: Secondary | ICD-10-CM | POA: Diagnosis present

## 2015-12-31 DIAGNOSIS — I2510 Atherosclerotic heart disease of native coronary artery without angina pectoris: Secondary | ICD-10-CM | POA: Diagnosis present

## 2015-12-31 DIAGNOSIS — E669 Obesity, unspecified: Secondary | ICD-10-CM | POA: Diagnosis present

## 2015-12-31 DIAGNOSIS — Z885 Allergy status to narcotic agent status: Secondary | ICD-10-CM

## 2015-12-31 DIAGNOSIS — J45909 Unspecified asthma, uncomplicated: Secondary | ICD-10-CM | POA: Diagnosis present

## 2015-12-31 DIAGNOSIS — Z955 Presence of coronary angioplasty implant and graft: Secondary | ICD-10-CM | POA: Diagnosis not present

## 2015-12-31 DIAGNOSIS — D649 Anemia, unspecified: Secondary | ICD-10-CM | POA: Diagnosis present

## 2015-12-31 DIAGNOSIS — E119 Type 2 diabetes mellitus without complications: Secondary | ICD-10-CM | POA: Diagnosis present

## 2015-12-31 DIAGNOSIS — F419 Anxiety disorder, unspecified: Secondary | ICD-10-CM | POA: Diagnosis present

## 2015-12-31 DIAGNOSIS — I959 Hypotension, unspecified: Secondary | ICD-10-CM | POA: Diagnosis present

## 2015-12-31 DIAGNOSIS — R0603 Acute respiratory distress: Secondary | ICD-10-CM

## 2015-12-31 DIAGNOSIS — R0902 Hypoxemia: Secondary | ICD-10-CM

## 2015-12-31 DIAGNOSIS — I10 Essential (primary) hypertension: Secondary | ICD-10-CM

## 2015-12-31 DIAGNOSIS — E785 Hyperlipidemia, unspecified: Secondary | ICD-10-CM | POA: Diagnosis present

## 2015-12-31 DIAGNOSIS — Z794 Long term (current) use of insulin: Secondary | ICD-10-CM | POA: Diagnosis not present

## 2015-12-31 LAB — URINALYSIS, ROUTINE W REFLEX MICROSCOPIC
Bilirubin Urine: NEGATIVE
GLUCOSE, UA: NEGATIVE mg/dL
HGB URINE DIPSTICK: NEGATIVE
Ketones, ur: NEGATIVE mg/dL
Leukocytes, UA: NEGATIVE
Nitrite: NEGATIVE
PROTEIN: NEGATIVE mg/dL
SPECIFIC GRAVITY, URINE: 1.007 (ref 1.005–1.030)
pH: 5.5 (ref 5.0–8.0)

## 2015-12-31 LAB — COMPREHENSIVE METABOLIC PANEL
ALK PHOS: 64 U/L (ref 38–126)
ALT: 12 U/L — AB (ref 14–54)
AST: 16 U/L (ref 15–41)
Albumin: 3.2 g/dL — ABNORMAL LOW (ref 3.5–5.0)
Anion gap: 14 (ref 5–15)
BILIRUBIN TOTAL: 1.2 mg/dL (ref 0.3–1.2)
BUN: 18 mg/dL (ref 6–20)
CHLORIDE: 92 mmol/L — AB (ref 101–111)
CO2: 31 mmol/L (ref 22–32)
Calcium: 9 mg/dL (ref 8.9–10.3)
Creatinine, Ser: 0.86 mg/dL (ref 0.44–1.00)
GFR calc Af Amer: >60 mL/min (ref >60)
GLUCOSE: 166 mg/dL — AB (ref 65–99)
POTASSIUM: 4.5 mmol/L (ref 3.5–5.1)
Sodium: 137 mmol/L (ref 135–145)
Total Protein: 7.4 g/dL (ref 6.5–8.1)

## 2015-12-31 LAB — CBC WITH DIFFERENTIAL/PLATELET
Basophils Absolute: 0.1 10*3/uL (ref 0.0–0.1)
Basophils Relative: 1 %
EOS PCT: 1 %
Eosinophils Absolute: 0.1 10*3/uL (ref 0.0–0.7)
HCT: 39.6 % (ref 36.0–46.0)
Hemoglobin: 11.3 g/dL — ABNORMAL LOW (ref 12.0–15.0)
LYMPHS ABS: 1.4 10*3/uL (ref 0.7–4.0)
LYMPHS PCT: 15 %
MCH: 24.2 pg — AB (ref 26.0–34.0)
MCHC: 28.5 g/dL — AB (ref 30.0–36.0)
MCV: 84.8 fL (ref 78.0–100.0)
MONO ABS: 1.6 10*3/uL — AB (ref 0.1–1.0)
MONOS PCT: 16 %
Neutro Abs: 6.5 10*3/uL (ref 1.7–7.7)
Neutrophils Relative %: 67 %
PLATELETS: 237 10*3/uL (ref 150–400)
RBC: 4.67 MIL/uL (ref 3.87–5.11)
RDW: 18.5 % — ABNORMAL HIGH (ref 11.5–15.5)
WBC: 9.6 10*3/uL (ref 4.0–10.5)

## 2015-12-31 LAB — BRAIN NATRIURETIC PEPTIDE: B Natriuretic Peptide: 727.8 pg/mL — ABNORMAL HIGH (ref 0.0–100.0)

## 2015-12-31 LAB — I-STAT CG4 LACTIC ACID, ED
LACTIC ACID, VENOUS: 1.28 mmol/L (ref 0.5–2.0)
LACTIC ACID, VENOUS: 1.13 mmol/L (ref 0.5–2.0)

## 2015-12-31 LAB — PROTIME-INR
INR: 1.88 — AB (ref 0.00–1.49)
Prothrombin Time: 21.5 seconds — ABNORMAL HIGH (ref 11.6–15.2)

## 2015-12-31 LAB — TROPONIN I: Troponin I: <0.03 ng/mL (ref <0.031)

## 2015-12-31 MED ORDER — FUROSEMIDE 10 MG/ML IJ SOLN
40.0000 mg | Freq: Once | INTRAMUSCULAR | Status: AC
  Administered 2015-12-31: 40 mg via INTRAVENOUS
  Filled 2015-12-31: qty 4

## 2015-12-31 MED ORDER — ESCITALOPRAM OXALATE 10 MG PO TABS
20.0000 mg | ORAL_TABLET | Freq: Every day | ORAL | Status: DC
  Administered 2015-12-31 – 2016-01-01 (×2): 20 mg via ORAL
  Filled 2015-12-31 (×2): qty 2

## 2015-12-31 MED ORDER — AMIODARONE HCL IN DEXTROSE 360-4.14 MG/200ML-% IV SOLN
30.0000 mg/h | INTRAVENOUS | Status: DC
  Administered 2015-12-31: 30 mg/h via INTRAVENOUS

## 2015-12-31 MED ORDER — WARFARIN - PHYSICIAN DOSING INPATIENT
Freq: Every day | Status: DC
  Administered 2016-01-01: 18:00:00

## 2015-12-31 MED ORDER — SODIUM CHLORIDE 0.9% FLUSH
3.0000 mL | Freq: Two times a day (BID) | INTRAVENOUS | Status: DC
  Administered 2015-12-31 – 2016-01-02 (×4): 3 mL via INTRAVENOUS

## 2015-12-31 MED ORDER — ALBUTEROL SULFATE (2.5 MG/3ML) 0.083% IN NEBU
2.5000 mg | INHALATION_SOLUTION | Freq: Four times a day (QID) | RESPIRATORY_TRACT | Status: DC
  Administered 2015-12-31: 2.5 mg via RESPIRATORY_TRACT
  Filled 2015-12-31: qty 3

## 2015-12-31 MED ORDER — CLOPIDOGREL BISULFATE 75 MG PO TABS
75.0000 mg | ORAL_TABLET | Freq: Every day | ORAL | Status: DC
  Administered 2015-12-31 – 2016-01-01 (×2): 75 mg via ORAL
  Filled 2015-12-31 (×3): qty 1

## 2015-12-31 MED ORDER — RAMIPRIL 10 MG PO CAPS
10.0000 mg | ORAL_CAPSULE | Freq: Every day | ORAL | Status: DC
  Administered 2015-12-31 – 2016-01-01 (×2): 10 mg via ORAL
  Filled 2015-12-31 (×3): qty 1

## 2015-12-31 MED ORDER — SODIUM CHLORIDE 0.9% FLUSH
3.0000 mL | Freq: Two times a day (BID) | INTRAVENOUS | Status: DC
  Administered 2016-01-01: 3 mL via INTRAVENOUS

## 2015-12-31 MED ORDER — SPIRONOLACTONE 25 MG PO TABS
12.5000 mg | ORAL_TABLET | Freq: Every day | ORAL | Status: DC
  Administered 2015-12-31 – 2016-01-01 (×2): 12.5 mg via ORAL
  Filled 2015-12-31 (×2): qty 1

## 2015-12-31 MED ORDER — SODIUM CHLORIDE 0.9 % IV SOLN
250.0000 mL | INTRAVENOUS | Status: DC | PRN
  Administered 2016-01-01: 250 mL via INTRAVENOUS

## 2015-12-31 MED ORDER — LEVOTHYROXINE SODIUM 100 MCG PO TABS
100.0000 ug | ORAL_TABLET | Freq: Every day | ORAL | Status: DC
  Administered 2016-01-01: 100 ug via ORAL
  Filled 2015-12-31: qty 1

## 2015-12-31 MED ORDER — INSULIN GLARGINE 100 UNIT/ML ~~LOC~~ SOLN
15.0000 [IU] | Freq: Two times a day (BID) | SUBCUTANEOUS | Status: DC
  Administered 2015-12-31 – 2016-01-01 (×3): 15 [IU] via SUBCUTANEOUS
  Filled 2015-12-31 (×5): qty 0.15

## 2015-12-31 MED ORDER — AMIODARONE HCL IN DEXTROSE 360-4.14 MG/200ML-% IV SOLN
60.0000 mg/h | Freq: Once | INTRAVENOUS | Status: AC
  Administered 2015-12-31: 60 mg/h via INTRAVENOUS
  Filled 2015-12-31: qty 200

## 2015-12-31 MED ORDER — INSULIN GLARGINE 100 UNIT/ML SOLOSTAR PEN: 10.0000 [IU] | PEN_INJECTOR | Freq: Every day | SUBCUTANEOUS | Status: DC

## 2015-12-31 MED ORDER — CHLORHEXIDINE GLUCONATE 0.12 % MT SOLN
15.0000 mL | Freq: Two times a day (BID) | OROMUCOSAL | Status: DC
Start: 2015-12-31 — End: 2016-01-01
  Administered 2016-01-01: 15 mL via OROMUCOSAL
  Filled 2015-12-31 (×2): qty 15

## 2015-12-31 MED ORDER — PRAVASTATIN SODIUM 40 MG PO TABS
40.0000 mg | ORAL_TABLET | Freq: Every day | ORAL | Status: DC
  Administered 2015-12-31 – 2016-01-01 (×2): 40 mg via ORAL
  Filled 2015-12-31 (×2): qty 1

## 2015-12-31 MED ORDER — AMIODARONE HCL IN DEXTROSE 360-4.14 MG/200ML-% IV SOLN
INTRAVENOUS | Status: AC
  Filled 2015-12-31: qty 200

## 2015-12-31 MED ORDER — ALBUTEROL SULFATE (2.5 MG/3ML) 0.083% IN NEBU
2.5000 mg | INHALATION_SOLUTION | Freq: Four times a day (QID) | RESPIRATORY_TRACT | Status: DC
  Administered 2016-01-01 – 2016-01-02 (×6): 2.5 mg via RESPIRATORY_TRACT
  Filled 2015-12-31 (×6): qty 3

## 2015-12-31 MED ORDER — PANTOPRAZOLE SODIUM 40 MG PO TBEC
40.0000 mg | DELAYED_RELEASE_TABLET | Freq: Every day | ORAL | Status: DC
  Administered 2015-12-31 – 2016-01-01 (×2): 40 mg via ORAL
  Filled 2015-12-31 (×2): qty 1

## 2015-12-31 MED ORDER — METFORMIN HCL 500 MG PO TABS
500.0000 mg | ORAL_TABLET | Freq: Two times a day (BID) | ORAL | Status: DC
  Administered 2015-12-31 – 2016-01-01 (×3): 500 mg via ORAL
  Filled 2015-12-31 (×3): qty 1

## 2015-12-31 MED ORDER — ALPRAZOLAM 0.25 MG PO TABS
0.2500 mg | ORAL_TABLET | Freq: Every day | ORAL | Status: DC
  Administered 2015-12-31: 0.25 mg via ORAL
  Filled 2015-12-31 (×2): qty 1

## 2015-12-31 MED ORDER — ADULT MULTIVITAMIN W/MINERALS CH
1.0000 | ORAL_TABLET | Freq: Every day | ORAL | Status: DC
  Administered 2015-12-31 – 2016-01-01 (×2): 1 via ORAL
  Filled 2015-12-31 (×2): qty 1

## 2015-12-31 MED ORDER — DILTIAZEM HCL 30 MG PO TABS
30.0000 mg | ORAL_TABLET | Freq: Three times a day (TID) | ORAL | Status: DC
  Administered 2015-12-31 – 2016-01-01 (×3): 30 mg via ORAL
  Filled 2015-12-31 (×5): qty 1

## 2015-12-31 MED ORDER — WARFARIN SODIUM 5 MG PO TABS
5.0000 mg | ORAL_TABLET | Freq: Every evening | ORAL | Status: DC
  Administered 2015-12-31 – 2016-01-01 (×2): 5 mg via ORAL
  Filled 2015-12-31 (×2): qty 1

## 2015-12-31 MED ORDER — LEVOTHYROXINE SODIUM 100 MCG PO TABS: 100.0000 ug | ORAL_TABLET | Freq: Every day | ORAL | Status: DC

## 2015-12-31 MED ORDER — SODIUM CHLORIDE 0.9% FLUSH: 3.0000 mL | INTRAVENOUS | Status: DC | PRN

## 2015-12-31 MED ORDER — CETYLPYRIDINIUM CHLORIDE 0.05 % MT LIQD
7.0000 mL | Freq: Two times a day (BID) | OROMUCOSAL | Status: DC
  Administered 2016-01-01 (×2): 7 mL via OROMUCOSAL

## 2015-12-31 MED ORDER — CARVEDILOL 6.25 MG PO TABS
6.2500 mg | ORAL_TABLET | Freq: Two times a day (BID) | ORAL | Status: DC
  Administered 2015-12-31 – 2016-01-01 (×2): 6.25 mg via ORAL
  Filled 2015-12-31 (×2): qty 1

## 2015-12-31 NOTE — H&P (Signed)
Referring Physician:  GENIENE [name] is an 75 y.o. female.                       Chief Complaint: Shortness of breath  HPI: 75 year old female with past medical history of CHF, CAD, DM, II, Obesity, Dyslipidemia, Hypothyroidism, Arthritis and anemia, has 1 week history of weight gain, leg edema and shortness of breath. She was recently discharged for CHF and aspiration pneumonia. She also has atrial fibrillation with rapid ventricular response.  Past Medical History  Diagnosis Date  . CHF (congestive heart failure) (HCC)   . Atrial fibrillation (HCC)   . Hypertension   . Thyroid disease   . Coronary artery disease   . Hypothyroidism   . Anxiety   . Shortness of breath     WITH MY CHF  . Anemia   . ACS (acute coronary syndrome) (HCC) 10/27/2013  . Diverticulosis 10/22/2005    Qualifier: Diagnosis of  By: Koleen Distance CMA (AAMA), Hulan Saas    . HIATAL HERNIA 10/22/2005    Qualifier: Diagnosis of  By: Koleen Distance CMA (AAMA), Hulan Saas    . HYPERCHOLESTEROLEMIA 03/09/2008    Qualifier: Diagnosis of  By: Koleen Distance CMA (AAMA), Hulan Saas    . Type II diabetes mellitus (HCC)   . History of blood transfusion     "related to hemorrhaging w/hysterectomy"  . Arthritis     "back; right knee" (12/03/2015)      Past Surgical History  Procedure Laterality Date  . Abdominal hysterectomy    . Cardiac catheterization  10/27/2013  . Coronary angioplasty with stent placement  10/27/2013    DES   TO RCA       DR HARWANI  . Cataract extraction w/ intraocular lens  implant, bilateral Bilateral 2014  . Left and right heart catheterization with coronary angiogram N/A 09/20/2013    Procedure: LEFT AND RIGHT HEART CATHETERIZATION WITH CORONARY ANGIOGRAM;  Surgeon: Ricki Rodriguez, MD;  Location: [facility] CATH LAB;  Service: Cardiovascular;  Laterality: N/A;  . Percutaneous coronary stent intervention (pci-s) N/A 09/22/2013    Procedure: PERCUTANEOUS CORONARY STENT INTERVENTION (PCI-S);  Surgeon: Robynn Pane, MD;  Location: [facility] CATH  LAB;  Service: Cardiovascular;  Laterality: N/A;  . Percutaneous coronary stent intervention (pci-s) N/A 10/27/2013    Procedure: PERCUTANEOUS CORONARY STENT INTERVENTION (PCI-S);  Surgeon: Robynn Pane, MD;  Location: [facility] CATH LAB;  Service: Cardiovascular;  Laterality: N/A;  . Appendectomy    . Laparoscopic cholecystectomy      Family History  Problem Relation Age of Onset  . Heart disease Father    Social History:  reports that she has quit smoking. Her smoking use included Cigarettes. She has a 36 pack-year smoking history. She has never used smokeless tobacco. She reports that she does not drink alcohol or use illicit drugs.  Allergies:  Allergies  Allergen Reactions  . Tramadol Other (See Comments)    Severe confusion  . Codeine Nausea And Vomiting  . Latex     Rash  . Doxycycline Itching and Rash     (Not in a hospital admission)  Results for orders placed or performed during the hospital encounter of 01/08/2016 (from the past 48 hour(s))  I-Stat CG4 Lactic Acid, ED  (not at  [facility])     Status: None   Collection Time: 12/30/2015  9:24 AM  Result Value Ref Range   Lactic Acid, Venous 1.28 0.5 - 2.0 mmol/L   No results found.  Review  Of Systems The patient admits to weight gain. Has vision change with need to wear reading glasses. Bilateral cataract surgery. No hearing loss. No tinnitus. Positive full upper and lower dentures. No cough, hemoptysis, asthma, COPD or pneumonia. Positive history of palpitations, chest pain and leg edema. No history of nausea, vomiting, diarrhea, constipation, bleeding from the bowels, cancer, hiatal hernia or hepatitis. Positive history of blood transfusion. No history of kidney stone, hematuria, stroke, seizures, psychiatric admissions. Positive history of joint pains  Blood pressure 150/87, pulse 125, temperature 101 F (38.3 C), temperature source Rectal, resp. rate 33, SpO2 95 %.  HEENT: The patient is normocephalic, atraumatic. Wears  glasses. Has light  brown eyes. Bil. Lens implants. Pupils equal and reacting to light. Wears upper and lower dentures.  NECK: + JVD, no carotid bruit, full range of motion. Neck is nontender without lymphadenopathy and no thyromegaly.  LUNGS: Bilateral basal crackles. + wheezing. HEART: Normal S1, S2, with grade 2/6 systolic murmur.  ABDOMEN: Soft, nontender, distended with scars of surgery.  EXTREMITIES: 1 + edema, no cyanosis or clubbing. Spider veins bilaterally with right knee swelling and tenderness bilaterally. Small 1/3" diameter dried ulcer right lower leg. CNS: Cranial nerves grossly intact. Bilateral equal grips. The patient is right-handed.  Skin:-Warm and dry.  Assessment/Plan Shortness of breath r/o CHF Hypertension  DM, II  Obesity  Hyperlipidemia  Atrial fibrillation with RVR,CHA2DVASC score of 6/9 Asthma, chronic Hypothyroidism CAD S/P stent  Admit/IV lasix/IV amiodarone.  Ricki Rodriguez, MD  01/11/2016, 9:29 AM

## 2015-12-31 NOTE — ED Notes (Signed)
Attempted report to floor x 1 

## 2015-12-31 NOTE — Progress Notes (Signed)
Patient is tolerating 3L [place]. Patient in no distress. Patient states she feels better. BIPAP is not needed at this time. RT will continue to monitor

## 2015-12-31 NOTE — ED Provider Notes (Signed)
CSN: [id number]     Arrival date & time 01/07/2016  4098 History   First MD Initiated Contact with Patient 01/12/2016 0857     Chief Complaint  Patient presents with  . Shortness of Breath     (Consider location/radiation/quality/duration/timing/severity/associated sxs/prior Treatment) The history is provided by the patient and medical records. No language interpreter was used.     Tracey [name] is a 75 y.o. female  with a hx of CHF, A. fib, hypertension, CAD, anemia, diabetes, arthritis presents to the Emergency Department complaining of gradual, persistent, progressively worsening shortness of breath onset 2 days ago and purposely worsening. Patient reports she was recently treated for pneumonia. She denies fevers at home. She reports compliance with all of her medications including her warfarin and Lasix.  Level 5 caveat for respiratory distress  Past Medical History  Diagnosis Date  . CHF (congestive heart failure) (HCC)   . Atrial fibrillation (HCC)   . Hypertension   . Thyroid disease   . Coronary artery disease   . Hypothyroidism   . Anxiety   . Shortness of breath     WITH MY CHF  . Anemia   . ACS (acute coronary syndrome) (HCC) 10/27/2013  . Diverticulosis 10/22/2005    Qualifier: Diagnosis of  By: Koleen Distance CMA (AAMA), Hulan Saas    . HIATAL HERNIA 10/22/2005    Qualifier: Diagnosis of  By: Koleen Distance CMA (AAMA), Hulan Saas    . HYPERCHOLESTEROLEMIA 03/09/2008    Qualifier: Diagnosis of  By: Koleen Distance CMA (AAMA), Hulan Saas    . Type II diabetes mellitus (HCC)   . History of blood transfusion     "related to hemorrhaging w/hysterectomy"  . Arthritis     "back; right knee" (12/03/2015)   Past Surgical History  Procedure Laterality Date  . Abdominal hysterectomy    . Cardiac catheterization  10/27/2013  . Coronary angioplasty with stent placement  10/27/2013    DES   TO RCA       DR HARWANI  . Cataract extraction w/ intraocular lens  implant, bilateral Bilateral 2014  . Left and right  heart catheterization with coronary angiogram N/A 09/20/2013    Procedure: LEFT AND RIGHT HEART CATHETERIZATION WITH CORONARY ANGIOGRAM;  Surgeon: Ricki Rodriguez, MD;  Location: [facility] CATH LAB;  Service: Cardiovascular;  Laterality: N/A;  . Percutaneous coronary stent intervention (pci-s) N/A 09/22/2013    Procedure: PERCUTANEOUS CORONARY STENT INTERVENTION (PCI-S);  Surgeon: Robynn Pane, MD;  Location: [facility] CATH LAB;  Service: Cardiovascular;  Laterality: N/A;  . Percutaneous coronary stent intervention (pci-s) N/A 10/27/2013    Procedure: PERCUTANEOUS CORONARY STENT INTERVENTION (PCI-S);  Surgeon: Robynn Pane, MD;  Location: [facility] CATH LAB;  Service: Cardiovascular;  Laterality: N/A;  . Appendectomy    . Laparoscopic cholecystectomy     Family History  Problem Relation Age of Onset  . Heart disease Father    Social History  Substance Use Topics  . Smoking status: Former Smoker -- 1.00 packs/day for 36 years    Types: Cigarettes  . Smokeless tobacco: Never Used     Comment: " I quit smoking in 1994 "  . Alcohol Use: No   OB History    No data available     Review of Systems  Unable to perform ROS: Severe respiratory distress  Constitutional: Negative for fever.  Respiratory: Positive for chest tightness, shortness of breath and wheezing.   Cardiovascular: Negative for chest pain.  Gastrointestinal: Negative for nausea, vomiting and abdominal pain.  Allergies  Tramadol; Codeine; Latex; and Doxycycline  Home Medications   Prior to Admission medications   Medication Sig Start Date End Date Taking? Authorizing Provider  ALPRAZolam Prudy Feeler) 0.5 MG tablet Take 0.5 mg by mouth daily at bedtime. 12/12/15  Yes Orpah Cobb, MD  clopidogrel (PLAVIX) 75 MG tablet Take 1 tablet (75 mg total) by mouth daily. 07/11/14  Yes Rinaldo Cloud, MD  diltiazem (CARDIZEM) 30 MG tablet Take 1 tablet (30 mg total) by mouth daily with breakfast. 12/12/15  Yes Orpah Cobb, MD  escitalopram (LEXAPRO)  20 MG tablet Take 1 tablet (20 mg total) by mouth daily. 04/03/15  Yes Corwin Levins, MD  furosemide (LASIX) 40 MG tablet Take 1 tablet (40 mg total) by mouth daily with breakfast. 12/12/15  Yes Orpah Cobb, MD  Insulin Glargine (LANTUS SOLOSTAR) 100 UNIT/ML Solostar Pen Inject 10 Units into the skin at bedtime. Patient taking differently: Inject 15 Units into the skin 2 (two) times daily.  12/12/15  Yes Orpah Cobb, MD  carvedilol (COREG) 6.25 MG tablet Take 1 tablet (6.25 mg total) by mouth 2 (two) times daily with a meal. 07/11/14   Rinaldo Cloud, MD  levothyroxine (SYNTHROID, LEVOTHROID) 100 MCG tablet Take 1 tablet (100 mcg total) by mouth daily before breakfast. 12/12/15   Orpah Cobb, MD  metFORMIN (GLUCOPHAGE) 500 MG tablet Take 500 mg by mouth 2 (two) times daily with a meal.    Historical Provider, MD  Multiple Vitamin (MULTIVITAMIN WITH MINERALS) TABS tablet Take 1 tablet by mouth daily.    Historical Provider, MD  pantoprazole (PROTONIX) 40 MG tablet Take 1 tablet (40 mg total) by mouth daily. 12/12/15   Orpah Cobb, MD  pravastatin (PRAVACHOL) 40 MG tablet Take 1 tablet (40 mg total) by mouth daily at 6 PM. 12/12/15   Orpah Cobb, MD  ramipril (ALTACE) 10 MG capsule Take 1 capsule (10 mg total) by mouth daily. 04/03/15   Corwin Levins, MD  spironolactone (ALDACTONE) 25 MG tablet Take 0.5 tablets (12.5 mg total) by mouth daily. 12/12/15   Orpah Cobb, MD  warfarin (COUMADIN) 5 MG tablet Take 5 mg by mouth every evening.    Historical Provider, MD   BP 150/87 mmHg  Pulse 125  Temp(Src) 101 F (38.3 C) (Rectal)  Resp 33  SpO2 95% Physical Exam  Constitutional: She appears well-developed and well-nourished. She appears distressed.  Awake, alert, nontoxic appearance  HENT:  Head: Normocephalic and atraumatic.  Mouth/Throat: Oropharynx is clear and moist. No oropharyngeal exudate.  Eyes: Conjunctivae are normal. No scleral icterus.  Neck: Normal range of motion. Neck supple.   Cardiovascular: Regular rhythm, normal heart sounds and intact distal pulses.  Tachycardia present.   Pulses:      Radial pulses are 2+ on the right side, and 2+ on the left side.  Pulmonary/Chest: Accessory muscle usage present. Tachypnea noted. She is in respiratory distress. She has decreased breath sounds. She has wheezes. She has rales.  Equal chest expansion  Abdominal: Soft. Bowel sounds are normal. She exhibits no mass. There is no tenderness. There is no rebound and no guarding.  Soft and nontender  Musculoskeletal: Normal range of motion. She exhibits no edema.  Neurological: She is alert.  Speech is clear and goal oriented Moves extremities without ataxia  Skin: She is diaphoretic. There is pallor.  Cool and diaphoretic  Psychiatric: She has a normal mood and affect.  Nursing note and vitals reviewed.   ED Course  Procedures (including critical care  time) Labs Review Labs Reviewed  COMPREHENSIVE METABOLIC PANEL - Abnormal; Notable for the following:    Chloride 92 (*)    Glucose, Bld 166 (*)    Albumin 3.2 (*)    ALT 12 (*)    All other components within normal limits  CBC WITH DIFFERENTIAL/PLATELET - Abnormal; Notable for the following:    Hemoglobin 11.3 (*)    MCH 24.2 (*)    MCHC 28.5 (*)    RDW 18.5 (*)    Monocytes Absolute 1.6 (*)    All other components within normal limits  BRAIN NATRIURETIC PEPTIDE - Abnormal; Notable for the following:    B Natriuretic Peptide 727.8 (*)    All other components within normal limits  PROTIME-INR - Abnormal; Notable for the following:    Prothrombin Time 21.5 (*)    INR 1.88 (*)    All other components within normal limits  CULTURE, BLOOD (ROUTINE X 2)  CULTURE, BLOOD (ROUTINE X 2)  URINE CULTURE  TROPONIN I  URINALYSIS, ROUTINE W REFLEX MICROSCOPIC (NOT AT [facility])  I-STAT CG4 LACTIC ACID, ED    Imaging Review Dg Chest Port 1 View  12/20/2015  CLINICAL DATA:  Sob, wheezing  today EXAM: PORTABLE CHEST 1 VIEW  COMPARISON:  Radiograph 12/06/2015 FINDINGS: Stable enlarged cardiac silhouette. There are low lung volumes. Mild interstitial edema pattern. No focal infiltrate. No pneumothorax. IMPRESSION: Cardiomegaly and mild interstitial edema. Electronically Signed   By: Genevive Bi M.D.   On: 12/22/2015 09:45   I have personally reviewed and evaluated these images and lab results as part of my medical decision-making.   EKG Interpretation   Date/Time:  Monday December 31 2015 08:55:37 EST Ventricular Rate:  145 PR Interval:    QRS Duration: 137 QT Interval:  297 QTC Calculation: 461 R Axis:   97 Text Interpretation:  Wide-QRS tachycardia Right bundle branch block  Baseline wander in lead(s) II III aVF old RBBB. Tachycardia no other sig  change from old Confirmed by Donnald Garre, MD, Lebron Conners [phone]) on 12/28/2015  8:58:51 AM     CRITICAL CARE Performed by: Dierdre Forth Total critical care time: 50 minutes Critical care time was exclusive of separately billable procedures and treating other patients. Critical care was necessary to treat or prevent imminent or life-threatening deterioration. Critical care was time spent personally by me on the following activities: development of treatment plan with patient and/or surrogate as well as nursing, discussions with consultants, evaluation of patient's response to treatment, examination of patient, obtaining history from patient or surrogate, ordering and performing treatments and interventions, ordering and review of laboratory studies, ordering and review of radiographic studies, pulse oximetry and re-evaluation of patient's condition.  MDM   Final diagnoses:  Shortness of breath  Acute on chronic left systolic heart failure (HCC)  Atrial fibrillation, unspecified type [facility])  Essential hypertension  Respiratory distress  Hypoxia   Erich Montane presents with respiratory distress, hypoxia.  No hypotension.  Recently treated for PNA  however today's picture is more consistent with CHF exacerbation.  Tachycardic, irregularly irregular with a rate of 128-140.  9:17 AM Dr. Algie Coffer at bedside, recommends amiodarone for rate control and lasix.  He will admit.    11:19 AM Showing improvement in heart rate. Hypoxia resolved with BiPAP.  Labs reassuring with normal lactic acid. Normal white blood cell count. Elevated BNP to 727. Negative troponin.  INR subtherapeutic at 1.88. A chest x-ray with cardiomegaly and interstitial edema.  BP 126/75 mmHg  Pulse  88  Temp(Src) 101 F (38.3 C) (Rectal)  Resp 22  SpO2 89%   The patient was discussed with and seen by Dr. Donnald Garre who agrees with the treatment plan.   [name] Client Gregroy Dombkowski, PA-C 12/25/2015 1121  Arby Barrette, MD Jan 17, 2016 1137

## 2015-12-31 NOTE — ED Notes (Signed)
Made a copy of pt's living will, given by family. Gave original copy back to family.

## 2015-12-31 NOTE — Progress Notes (Signed)
Pt taken off bipap per her request and placed on 3L [place]. Pt denies SOB at this time. Pt not in distress, no increased WOB, no SOB. Pt may benefit from bipap again later. RT will continue to monitor

## 2015-12-31 NOTE — Progress Notes (Signed)
Report obtained from ED nurse at this time.

## 2015-12-31 NOTE — Progress Notes (Signed)
Called ED to find out if pt is still coming. States, "waiting on RT to transport"

## 2015-12-31 NOTE — ED Notes (Addendum)
Per EMS - recently seen at [facility], admitted for pneumonia. D/c to rehab facility and then to home - constantly short of breath, worse today. Rhonchi and wheezing throughout. Pt on second neb, wheezing in all lung fields. Pt wears 3L [place] all the time at home - 88%; w/ neb SpO2 98%. Total  albuterol, .5 atrovent. Pt also given  solumedrol

## 2015-12-31 NOTE — ED Notes (Signed)
Pt tachycardic and tachypneic - EDP aware.

## 2016-01-01 LAB — CBC
HCT: 40.6 % (ref 36.0–46.0)
Hemoglobin: 11 g/dL — ABNORMAL LOW (ref 12.0–15.0)
MCH: 23.3 pg — ABNORMAL LOW (ref 26.0–34.0)
MCHC: 27.1 g/dL — AB (ref 30.0–36.0)
MCV: 85.8 fL (ref 78.0–100.0)
Platelets: 253 10*3/uL (ref 150–400)
RBC: 4.73 MIL/uL (ref 3.87–5.11)
RDW: 18.2 % — AB (ref 11.5–15.5)
WBC: 11.2 10*3/uL — AB (ref 4.0–10.5)

## 2016-01-01 LAB — GLUCOSE, CAPILLARY
GLUCOSE-CAPILLARY: 122 mg/dL — AB (ref 65–99)
GLUCOSE-CAPILLARY: 117 mg/dL — AB (ref 65–99)
Glucose-Capillary: 134 mg/dL — ABNORMAL HIGH (ref 65–99)
Glucose-Capillary: 124 mg/dL — ABNORMAL HIGH (ref 65–99)

## 2016-01-01 LAB — BASIC METABOLIC PANEL
ANION GAP: 10 (ref 5–15)
BUN: 25 mg/dL — AB (ref 6–20)
CALCIUM: 8.9 mg/dL (ref 8.9–10.3)
CO2: 36 mmol/L — ABNORMAL HIGH (ref 22–32)
Chloride: 92 mmol/L — ABNORMAL LOW (ref 101–111)
Creatinine, Ser: 1.09 mg/dL — ABNORMAL HIGH (ref 0.44–1.00)
GFR calc Af Amer: 56 mL/min — ABNORMAL LOW (ref >60)
GFR, EST NON AFRICAN AMERICAN: 48 mL/min — AB (ref >60)
GLUCOSE: 192 mg/dL — AB (ref 65–99)
Potassium: 4.2 mmol/L (ref 3.5–5.1)
SODIUM: 138 mmol/L (ref 135–145)

## 2016-01-01 LAB — PROTIME-INR
INR: 2.17 — AB (ref 0.00–1.49)
PROTHROMBIN TIME: 24 s — AB (ref 11.6–15.2)

## 2016-01-01 LAB — URINE CULTURE

## 2016-01-01 MED ORDER — DEXTROSE 5 % IV SOLN
250.0000 mg | Freq: Two times a day (BID) | INTRAVENOUS | Status: DC
  Administered 2016-01-01: 250 mg via INTRAVENOUS
  Filled 2016-01-01 (×5): qty 250

## 2016-01-01 MED ORDER — CETYLPYRIDINIUM CHLORIDE 0.05 % MT LIQD: 7.0000 mL | Freq: Two times a day (BID) | OROMUCOSAL | Status: DC

## 2016-01-01 MED ORDER — ONDANSETRON HCL 4 MG/2ML IJ SOLN
4.0000 mg | Freq: Four times a day (QID) | INTRAMUSCULAR | Status: DC | PRN
  Administered 2016-01-01: 4 mg via INTRAVENOUS
  Filled 2016-01-01: qty 2

## 2016-01-01 MED ORDER — CHLORHEXIDINE GLUCONATE 0.12 % MT SOLN
15.0000 mL | Freq: Two times a day (BID) | OROMUCOSAL | Status: DC
  Administered 2016-01-02: 15 mL via OROMUCOSAL

## 2016-01-01 MED ORDER — SODIUM CHLORIDE 0.9 % IV BOLUS (SEPSIS)
350.0000 mL | Freq: Once | INTRAVENOUS | Status: AC
  Administered 2016-01-01: 350 mL via INTRAVENOUS

## 2016-01-01 MED ORDER — DOPAMINE-DEXTROSE 3.2-5 MG/ML-% IV SOLN: 2.5000 ug/kg/min | INTRAVENOUS | Status: DC

## 2016-01-01 MED ORDER — DOPAMINE-DEXTROSE 3.2-5 MG/ML-% IV SOLN
2.5000 ug/kg/min | INTRAVENOUS | Status: DC
  Administered 2016-01-01: 5 ug/kg/min via INTRAVENOUS
  Filled 2016-01-01: qty 250

## 2016-01-01 MED ORDER — DIGOXIN 0.25 MG/ML IJ SOLN
0.2500 mg | Freq: Once | INTRAMUSCULAR | Status: AC
  Administered 2016-01-01: 0.25 mg via INTRAVENOUS
  Filled 2016-01-01: qty 2

## 2016-01-01 MED ORDER — SODIUM CHLORIDE 0.9 % IV BOLUS (SEPSIS)
150.0000 mL | Freq: Once | INTRAVENOUS | Status: AC
  Administered 2016-01-01: 150 mL via INTRAVENOUS

## 2016-01-01 MED ORDER — INSULIN ASPART 100 UNIT/ML ~~LOC~~ SOLN
0.0000 [IU] | Freq: Three times a day (TID) | SUBCUTANEOUS | Status: DC
  Administered 2016-01-01 (×3): 2 [IU] via SUBCUTANEOUS

## 2016-01-01 MED ORDER — AMIODARONE HCL 200 MG PO TABS
200.0000 mg | ORAL_TABLET | Freq: Two times a day (BID) | ORAL | Status: DC
  Administered 2016-01-01 (×2): 200 mg via ORAL
  Filled 2016-01-01 (×3): qty 1

## 2016-01-01 NOTE — Progress Notes (Signed)
TC to Dr Algie Coffer re: pt BP remains low 71/40 map 50. Orders give another 150 NS bolus IV. Barbera Setters RN

## 2016-01-01 NOTE — Progress Notes (Signed)
RT called to patient beside because she has been desating on [place] and the RN placed NRB on patient. RT placed patient on HFNC 10L  and patient sat remained above 90. RT will continue to monitor.

## 2016-01-01 NOTE — Progress Notes (Signed)
Ref: Oliver Barre, MD   Subjective:  Low blood pressure. Now on dopamine drip. Afebrile.  Objective:  Vital Signs in the last 24 hours: Temp:  [97 F (36.1 C)-98.9 F (37.2 C)] 98.9 F (37.2 C) (02/14 1125) Pulse Rate:  [65-112] 96 (02/14 1500) Cardiac Rhythm:  [-] Atrial fibrillation (02/14 0800) Resp:  [17-33] 21 (02/14 1500) BP: (69-133)/(35-90) 97/72 mmHg (02/14 1500) SpO2:  [75 %-100 %] 95 % (02/14 1500) FiO2 (%):  [40 %-60 %] 50 % (02/14 1400) Weight:  [95.482 kg (210 lb 8 oz)-96.389 kg (212 lb 8 oz)] 96.389 kg (212 lb 8 oz) (02/14 0349)  Physical Exam: BP Readings from Last 1 Encounters:  01/01/16 97/72    Wt Readings from Last 1 Encounters:  01/01/16 96.389 kg (212 lb 8 oz)    Weight change:   HEENT: [place]/AT, Eyes-Light [employer], PERL, EOMI, Conjunctiva-Pink, Sclera-Non-icteric Neck: + JVD, No bruit, Trachea midline. Lungs:  Clearing, Bilateral. Cardiac:  Regular rhythm, normal S1 and S2, no S3. II/VI systolic murmur Abdomen:  Soft, non-tender. Extremities:  1 + edema present. No cyanosis. No clubbing. CNS: AxOx3, Cranial nerves grossly intact, moves all 4 extremities. Right handed. Skin: Warm and dry.   Intake/Output from previous day: 02/13 0701 - 02/14 0700 In: 2113.9 [P.O.:1900; I.V.:213.9] Out: 550 [Urine:550]    Lab Results: BMET    Component Value Date/Time   NA 138 01/01/2016 0429   NA 137 12/19/2015 0903   NA 142 12/12/2015 0514   K 4.2 01/01/2016 0429   K 4.5 12/30/2015 0903   K 4.3 12/12/2015 0514   CL 92* 01/01/2016 0429   CL 92* 01/05/2016 0903   CL 95* 12/12/2015 0514   CO2 36* 01/01/2016 0429   CO2 31 01/08/2016 0903   CO2 34* 12/12/2015 0514   GLUCOSE 192* 01/01/2016 0429   GLUCOSE 166* 12/29/2015 0903   GLUCOSE 137* 12/12/2015 0514   BUN 25* 01/01/2016 0429   BUN 18 12/20/2015 0903   BUN 25* 12/12/2015 0514   CREATININE 1.09* 01/01/2016 0429   CREATININE 0.86 01/09/2016 0903   CREATININE 0.84 12/12/2015 0514   CALCIUM 8.9  01/01/2016 0429   CALCIUM 9.0 12/19/2015 0903   CALCIUM 8.1* 12/12/2015 0514   GFRNONAA 48* 01/01/2016 0429   GFRNONAA >60 01/11/2016 0903   GFRNONAA >60 12/12/2015 0514   GFRAA 56* 01/01/2016 0429   GFRAA >60 01/01/2016 0903   GFRAA >60 12/12/2015 0514   CBC    Component Value Date/Time   WBC 11.2* 01/01/2016 0429   RBC 4.73 01/01/2016 0429   HGB 11.0* 01/01/2016 0429   HCT 40.6 01/01/2016 0429   PLT 253 01/01/2016 0429   MCV 85.8 01/01/2016 0429   MCH 23.3* 01/01/2016 0429   MCHC 27.1* 01/01/2016 0429   RDW 18.2* 01/01/2016 0429   LYMPHSABS 1.4 01/15/2016 0903   MONOABS 1.6* 12/23/2015 0903   EOSABS 0.1 01/15/2016 0903   BASOSABS 0.1 01/01/2016 0903   HEPATIC Function Panel  Recent Labs  12/03/15 1416 12/12/15 0514 01/03/2016 0903  PROT 7.0 6.4* 7.4   HEMOGLOBIN A1C No components found for: HGA1C,  MPG CARDIAC ENZYMES Lab Results  Component Value Date   TROPONINI <0.03 01/14/2016   TROPONINI <0.03 12/04/2015   TROPONINI <0.03 12/03/2015   BNP No results for input(s): PROBNP in the last 8760 hours. TSH  Recent Labs  03/30/15 1130 12/04/15 1107  TSH 1.51 3.298   CHOLESTEROL  Recent Labs  03/30/15 1130 09/28/15 1108  CHOL 237* 186  Scheduled Meds: . acetaZOLAMIDE (DIAMOX) IVPB  250 mg Intravenous Q12H  . albuterol  2.5 mg Nebulization QID  . ALPRAZolam  0.25 mg Oral QHS  . amiodarone  200 mg Oral BID  . antiseptic oral rinse  7 mL Mouth Rinse q12n4p  . chlorhexidine  15 mL Mouth Rinse BID  . clopidogrel  75 mg Oral Daily  . escitalopram  20 mg Oral Daily  . insulin aspart  0-15 Units Subcutaneous TID WC  . insulin glargine  15 Units Subcutaneous BID  . levothyroxine  100 mcg Oral QAC breakfast  . metFORMIN  500 mg Oral BID WC  . multivitamin with minerals  1 tablet Oral Daily  . pantoprazole  40 mg Oral Daily  . pravastatin  40 mg Oral q1800  . ramipril  10 mg Oral Daily  . sodium chloride flush  3 mL Intravenous Q12H  . sodium chloride  flush  3 mL Intravenous Q12H  . warfarin  5 mg Oral QPM  . Warfarin - Physician Dosing Inpatient   Does not apply q1800   Continuous Infusions: . DOPamine 5 mcg/kg/min (01/01/16 1317)   PRN Meds:.sodium chloride, ondansetron (ZOFRAN) IV, sodium chloride flush  Assessment/Plan: Acute left heart systolic failure Shortness of breath Hypertension  DM, II  Obesity  Hyperlipidemia  Atrial fibrillation with RVR,CHA2DVASC score of 6/9 Asthma, chronic Hypothyroidism CAD S/P stent   Inotropic support. Decrease blood pressure medications.   LOS: 1 day    Orpah Cobb  MD  01/01/2016, 3:25 PM

## 2016-01-01 NOTE — Progress Notes (Signed)
Pt SBP down to 75-83. Dr Algie Coffer notified. Orders received to give 350 bolus NS IV. Barbera Setters RN

## 2016-01-01 NOTE — Progress Notes (Signed)
RT called to patient beside. Patient sat in the 60s on 10L HFNC. RT placed patient on BIPAP. RT placed patient on 20/8 and 60%. Patient is tolerating well. RT will continue to monitor.

## 2016-01-02 ENCOUNTER — Inpatient Hospital Stay (HOSPITAL_COMMUNITY): Payer: Commercial Managed Care - HMO

## 2016-01-02 ENCOUNTER — Telehealth: Payer: Self-pay

## 2016-01-02 DIAGNOSIS — I110 Hypertensive heart disease with heart failure: Secondary | ICD-10-CM | POA: Diagnosis not present

## 2016-01-02 LAB — PROTIME-INR
INR: 2.35 — AB (ref 0.00–1.49)
PROTHROMBIN TIME: 25.5 s — AB (ref 11.6–15.2)

## 2016-01-02 LAB — GLUCOSE, CAPILLARY
GLUCOSE-CAPILLARY: 126 mg/dL — AB (ref 65–99)
Glucose-Capillary: 133 mg/dL — ABNORMAL HIGH (ref 65–99)

## 2016-01-02 MED ORDER — MORPHINE SULFATE 25 MG/ML IV SOLN
2.0000 mg/h | INTRAVENOUS | Status: DC
  Administered 2016-01-02: 2 mg/h via INTRAVENOUS
  Filled 2016-01-02: qty 10

## 2016-01-02 MED ORDER — CETYLPYRIDINIUM CHLORIDE 0.05 % MT LIQD
7.0000 mL | Freq: Two times a day (BID) | OROMUCOSAL | Status: DC
  Administered 2016-01-02: 7 mL via OROMUCOSAL

## 2016-01-02 MED ORDER — CHLORHEXIDINE GLUCONATE 0.12 % MT SOLN: 15.0000 mL | Freq: Two times a day (BID) | OROMUCOSAL | Status: DC

## 2016-01-02 MED ORDER — DIGOXIN 0.25 MG/ML IJ SOLN
0.2500 mg | Freq: Once | INTRAMUSCULAR | Status: AC
  Administered 2016-01-02: 0.25 mg via INTRAVENOUS
  Filled 2016-01-02 (×2): qty 2

## 2016-01-02 MED ORDER — SODIUM CHLORIDE 0.9 % IV BOLUS (SEPSIS)
250.0000 mL | Freq: Once | INTRAVENOUS | Status: AC
  Administered 2016-01-02: 250 mL via INTRAVENOUS

## 2016-01-05 LAB — CULTURE, BLOOD (ROUTINE X 2)
CULTURE: NO GROWTH
CULTURE: NO GROWTH

## 2016-01-10 ENCOUNTER — Inpatient Hospital Stay: Payer: Commercial Managed Care - HMO | Admitting: Internal Medicine

## 2016-01-16 NOTE — Discharge Summary (Signed)
Physician Death Summary  Patient ID: Tracey [name] MRN: [id number] DOB/AGE: [birth year] 75 y.o.  Admit date: 12/25/2015 Discharge date: 01/05/2016  Admission Diagnoses: Acute left heart systolic failure Shortness of breath Hypertension  DM, II  Obesity  Hyperlipidemia  Atrial fibrillation with RVR,CHA2DVASC score of 6/9 Asthma, chronic Hypothyroidism CAD S/P stent  Discharge Diagnoses:  Principle Problem: * Acute left heart systolic failure* Active Problems: Acute on chronic hypercarbic respiratory failure  Hypertension  DM, II  Obesity  Hyperlipidemia  Atrial fibrillation with RVR,CHA2DVASC score of 6/9 Asthma, chronic Hypothyroidism CAD S/P stent  Discharged Condition: Expired-1520.  Hospital Course: 75 year old female with past medical history of CHF, CAD, DM, II, Obesity, Dyslipidemia, Hypothyroidism, Arthritis and anemia, had 1 week history of weight gain, leg edema and shortness of breath. She was recently discharged for CHF and aspiration pneumonia. She also had atrial fibrillation with rapid ventricular response. Her heart rate was controlled with amiodarone, diltiazem and metoprolol. She had poor response to lasix, inotropic agent dopamine and lanoxin. Her urine out dropped sharply in last 24 hours. Fluid boluses did not help. Patient and Family had accepted DNR and then accepted comfort care today. She was started on morphine drip, Bi-pap was removed and she expired 15:20 hr. Funeral home arrangements were made with help of nursing staff and family at bed side.   Consults: cardiology  Significant Diagnostic Studies: labs: BNP of 727.8. CMET somewhat improved compared to 3 weeks ago with BUN of 18 and creatinine of 0.86. Normal UA. Near normal CBC with mild anemia. Stable blood glucose levels.  Treatments: cardiac meds: dopamine drip, metoprolol, digoxin, furosemide and amiodarone.  Discharge Exam: Blood pressure 69/44, pulse 88, temperature 96.2 F (35.7  C), temperature source Axillary, resp. rate 0, height  (1.6 m), weight 96.616 kg (213 lb), SpO2 96 %. HEENT: [place]/AT, Eyes-Light [place], Conjunctiva-Pink, Sclera-Non-icteric. Bipap mask now off. Neck: No JVD, No bruit, Trachea midline. Lungs:not breathing Cardiac: no heart sounds. Abdomen: Soft. Extremities: 1 + edema present. No cyanosis. No clubbing. CNS: AxOx0,  Skin: Warm and dry.  Disposition: 20-Expired./Funeral home.     Medication List    ASK your doctor about these medications        ALPRAZolam 0.5 MG tablet  Commonly known as:  XANAX  Take 0.5 mg by mouth daily at bedtime.     carvedilol 6.25 MG tablet  Commonly known as:  COREG  Take 1 tablet (6.25 mg total) by mouth 2 (two) times daily with a meal.     clopidogrel 75 MG tablet  Commonly known as:  PLAVIX  Take 1 tablet (75 mg total) by mouth daily.     diltiazem 30 MG tablet  Commonly known as:  CARDIZEM  Take 1 tablet (30 mg total) by mouth daily with breakfast.     escitalopram 20 MG tablet  Commonly known as:  LEXAPRO  Take 1 tablet (20 mg total) by mouth daily.     furosemide 40 MG tablet  Commonly known as:  LASIX  Take 1 tablet (40 mg total) by mouth daily with breakfast.     insulin detemir 100 UNIT/ML injection  Commonly known as:  LEVEMIR  Inject 10 Units into the skin at bedtime.     Insulin Glargine 100 UNIT/ML Solostar Pen  Commonly known as:  LANTUS SOLOSTAR  Inject 10 Units into the skin at bedtime.     levothyroxine 100 MCG tablet  Commonly known as:  SYNTHROID, LEVOTHROID  Take 1 tablet (  100 mcg total) by mouth daily before breakfast.     metFORMIN 500 MG tablet  Commonly known as:  GLUCOPHAGE  Take 500 mg by mouth 2 (two) times daily with a meal.     multivitamin with minerals Tabs tablet  Take 1 tablet by mouth daily.     pantoprazole 40 MG tablet  Commonly known as:  PROTONIX  Take 1 tablet (40 mg total) by mouth daily.     pravastatin 40 MG tablet  Commonly known  as:  PRAVACHOL  Take 1 tablet (40 mg total) by mouth daily at 6 PM.     ramipril 10 MG capsule  Commonly known as:  ALTACE  Take 1 capsule (10 mg total) by mouth daily.     spironolactone 25 MG tablet  Commonly known as:  ALDACTONE  Take 0.5 tablets (12.5 mg total) by mouth daily.     warfarin 5 MG tablet  Commonly known as:  COUMADIN  Take 5 mg by mouth every evening.         SignedOrpah Cobb S January 19, 2016, 4:58 PM

## 2016-01-16 NOTE — Progress Notes (Signed)
1425: Pt placed on Morphine gtt per MD order and family wishes. Family at bedside.   1440: Pt removed from Bipap and on Room Air.

## 2016-01-16 NOTE — Progress Notes (Addendum)
Patient tachycardic between 90s-140s on of dopamine. Paged Dr. Algie Coffer, received orders to decrease dopamine to 2.28mcg and trend BPs. BPs decreased to 80-90s systolic - [place] made aware. Received orders for 0.25mg  of digoxin and foley catheter to monitor urine output since patient has been incontinent to see if fluid resuscitation would be an option. Patient had of output after insertion. Will continue to monitor.

## 2016-01-16 NOTE — Progress Notes (Signed)
Morphine from gtt wasted in sink, witnessed x2 RN (Brett Fairy, RN and Olevia Perches, RN)

## 2016-01-16 NOTE — Progress Notes (Signed)
Ref: Oliver Barre, MD   Subjective:  Discussed care with family. Comfort care is accepted. Hypotension continues with IV lanoxin and dopamine use. Urine output dropping. Heart rate under control.  Objective:  Vital Signs in the last 24 hours: Temp:  [96.2 F (35.7 C)-97.9 F (36.6 C)] 96.2 F (35.7 C) (02/15 1126) Pulse Rate:  [35-117] 88 (02/15 1235) Cardiac Rhythm:  [-] Bundle branch block;Atrial fibrillation (02/15 0706) Resp:  [14-33] 18 (02/15 1235) BP: (69-108)/(35-94) 69/44 mmHg (02/15 1235) SpO2:  [90 %-100 %] 96 % (02/15 1235) FiO2 (%):  [50 %] 50 % (02/15 0310) Weight:  [96.616 kg (213 lb)] 96.616 kg (213 lb) (02/15 0310)  Physical Exam: BP Readings from Last 1 Encounters:  01/08/2016 69/44    Wt Readings from Last 1 Encounters:  01/09/2016 96.616 kg (213 lb)    Weight change: 1.134 kg (2 lb 8 oz)  HEENT: [place]/AT, Eyes-Light Brown, Conjunctiva-Pink, Sclera-Non-icteric. Bipap mask on. Neck: No JVD, No bruit, Trachea midline. Lungs:  Clear, Bilateral. Cardiac:  Regular rhythm, normal S1 and S2, no S3. II/VI systolic murmur. Abdomen:  Soft, non-tender. Extremities:  1 + edema present. No cyanosis. No clubbing. CNS: AxOx0, Cranial nerves grossly intact, moves all 4 extremities. Right handed. Skin: Warm and dry.   Intake/Output from previous day: 02/14 0701 - 02/15 0700 In: 585.1 [I.V.:385.1; IV Piggyback:200] Out: 100 [Urine:100]    Lab Results: BMET    Component Value Date/Time   NA 138 01/01/2016 0429   NA 137 Jan 15, 2016 0903   NA 142 12/12/2015 0514   K 4.2 01/01/2016 0429   K 4.5 01/15/2016 0903   K 4.3 12/12/2015 0514   CL 92* 01/01/2016 0429   CL 92* 2016-01-15 0903   CL 95* 12/12/2015 0514   CO2 36* 01/01/2016 0429   CO2 31 January 15, 2016 0903   CO2 34* 12/12/2015 0514   GLUCOSE 192* 01/01/2016 0429   GLUCOSE 166* 01-15-16 0903   GLUCOSE 137* 12/12/2015 0514   BUN 25* 01/01/2016 0429   BUN 18 01/15/2016 0903   BUN 25* 12/12/2015 0514   CREATININE  1.09* 01/01/2016 0429   CREATININE 0.86 2016/01/15 0903   CREATININE 0.84 12/12/2015 0514   CALCIUM 8.9 01/01/2016 0429   CALCIUM 9.0 01-15-2016 0903   CALCIUM 8.1* 12/12/2015 0514   GFRNONAA 48* 01/01/2016 0429   GFRNONAA >60 15-Jan-2016 0903   GFRNONAA >60 12/12/2015 0514   GFRAA 56* 01/01/2016 0429   GFRAA >60 2016/01/15 0903   GFRAA >60 12/12/2015 0514   CBC    Component Value Date/Time   WBC 11.2* 01/01/2016 0429   RBC 4.73 01/01/2016 0429   HGB 11.0* 01/01/2016 0429   HCT 40.6 01/01/2016 0429   PLT 253 01/01/2016 0429   MCV 85.8 01/01/2016 0429   MCH 23.3* 01/01/2016 0429   MCHC 27.1* 01/01/2016 0429   RDW 18.2* 01/01/2016 0429   LYMPHSABS 1.4 01-15-16 0903   MONOABS 1.6* 01/15/2016 0903   EOSABS 0.1 2016/01/15 0903   BASOSABS 0.1 2016/01/15 0903   HEPATIC Function Panel  Recent Labs  12/03/15 1416 12/12/15 0514 15-Jan-2016 0903  PROT 7.0 6.4* 7.4   HEMOGLOBIN A1C No components found for: HGA1C,  MPG CARDIAC ENZYMES Lab Results  Component Value Date   TROPONINI <0.03 01/15/2016   TROPONINI <0.03 12/04/2015   TROPONINI <0.03 12/03/2015   BNP No results for input(s): PROBNP in the last 8760 hours. TSH  Recent Labs  03/30/15 1130 12/04/15 1107  TSH 1.51 3.298   CHOLESTEROL  Recent Labs  03/30/15 1130 09/28/15 1108  CHOL 237* 186    Scheduled Meds: . acetaZOLAMIDE (DIAMOX) IVPB  250 mg Intravenous Q12H  . albuterol  2.5 mg Nebulization QID  . ALPRAZolam  0.25 mg Oral QHS  . amiodarone  200 mg Oral BID  . antiseptic oral rinse  7 mL Mouth Rinse q12n4p  . chlorhexidine  15 mL Mouth Rinse BID  . clopidogrel  75 mg Oral Daily  . escitalopram  20 mg Oral Daily  . insulin aspart  0-15 Units Subcutaneous TID WC  . insulin glargine  15 Units Subcutaneous BID  . levothyroxine  100 mcg Oral QAC breakfast  . metFORMIN  500 mg Oral BID WC  . multivitamin with minerals  1 tablet Oral Daily  . pantoprazole  40 mg Oral Daily  . pravastatin  40 mg  Oral q1800  . ramipril  10 mg Oral Daily  . sodium chloride flush  3 mL Intravenous Q12H  . sodium chloride flush  3 mL Intravenous Q12H  . warfarin  5 mg Oral QPM  . Warfarin - Physician Dosing Inpatient   Does not apply q1800   Continuous Infusions: . DOPamine 2.5 mcg/kg/min (01/01/16 2132)  . morphine     PRN Meds:.sodium chloride, ondansetron (ZOFRAN) IV, sodium chloride flush  Assessment/Plan: Acute left heart systolic failure Shortness of breath Hypertension  DM, II  Obesity  Hyperlipidemia  Atrial fibrillation with RVR,CHA2DVASC score of 6/9 Asthma, chronic Hypothyroidism CAD S/P stent   IV morphine drip.    LOS: 2 days    Orpah Cobb  MD  01/19/2016, 1:00 PM     discus

## 2016-01-16 NOTE — Progress Notes (Addendum)
Pt asystole on monitor. No heart nor lung sounds auscultated x2 RN Olevia Perches, RN and O. Ande, RN) for one mintue. Family at bedside and made aware of patient passing. Attending MD called and made aware. Hand prints done and given to family along with patient belongs, except dentures, watch and glasses, which are on pt. Chaplain offered and declined. Emotional support provided.  Time of death 1520.

## 2016-01-16 NOTE — Telephone Encounter (Signed)
Home Health Cert/Plan of Care (12/24/2015-02/21/2016) received and placed on MD's desk for signature

## 2016-01-16 NOTE — Progress Notes (Signed)
Dr Algie Coffer notified of pt SBP in the 60s, family at bedside awaiting meeting with MD. No new orders received. Will continue to monitor closely.

## 2016-01-16 NOTE — Progress Notes (Signed)
Dr. Algie Coffer called to make aware family at bedside and wanting to discuss plan of care for patient. Meeting time set for 12:00 pm.

## 2016-01-16 NOTE — Progress Notes (Signed)
Dr Algie Coffer called and made aware of patient change in LOC. Pt only opens eyes to voice but is not sustained, and immediately becomes drowsy and closes eyes again. Patient will follow simple commands at time. Sisters at bedside and updated. No new orders received. MD will come to see patient. Will continue to monitor closely.

## 2016-01-16 DEATH — deceased

## 2016-01-22 ENCOUNTER — Telehealth: Payer: Self-pay | Admitting: Internal Medicine

## 2016-01-22 NOTE — Telephone Encounter (Signed)
Needs last OV notes to go with Face to Face.  Can fax to [phone]

## 2016-04-01 ENCOUNTER — Ambulatory Visit: Payer: Commercial Managed Care - HMO | Admitting: Internal Medicine

## 2017-01-19 IMAGING — CR DG ABD PORTABLE 1V
1 series · 1 of 1 positions shown · non-contrast
Comparison: None.

CLINICAL DATA: Orogastric tube placement

EXAM:
PORTABLE ABDOMEN - 1 VIEW

[AP]
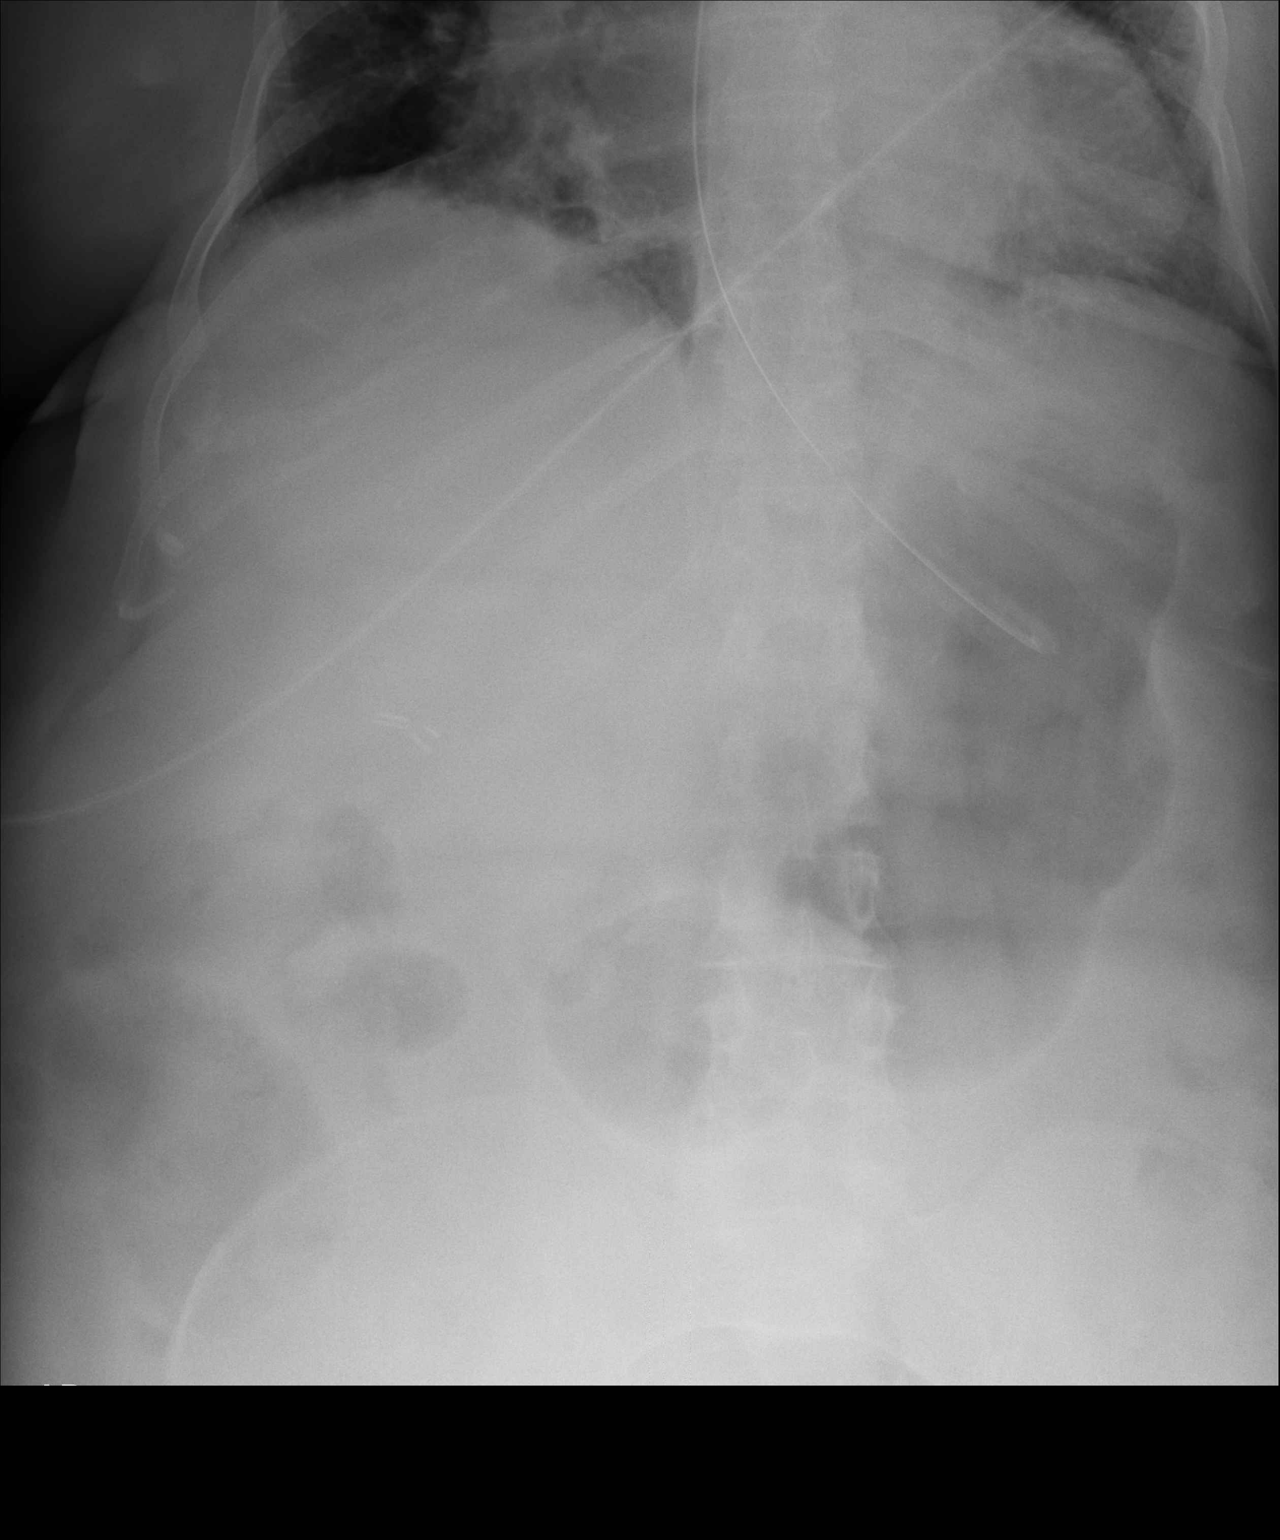

[1 of 1 positions shown; findings below may reference images not displayed]

FINDINGS: Nasogastric tube with the tip projecting over the fundus of the
stomach. There is no bowel dilatation to suggest obstruction. There
is no evidence of pneumoperitoneum, portal venous gas or
pneumatosis. There are no pathologic calcifications along the
expected course of the ureters. The osseous structures are
unremarkable.
IMPRESSION: Nasogastric tube with the tip projecting over the fundus of the
stomach.

## 2017-01-19 IMAGING — CR DG CHEST 1V PORT
1 series · 1 of 1 positions shown · non-contrast
Comparison: PA and lateral chest earlier today.

CLINICAL DATA: Status post intubation and central line placement
today. Initial encounter.

EXAM:
PORTABLE CHEST 1 VIEW

[AP]
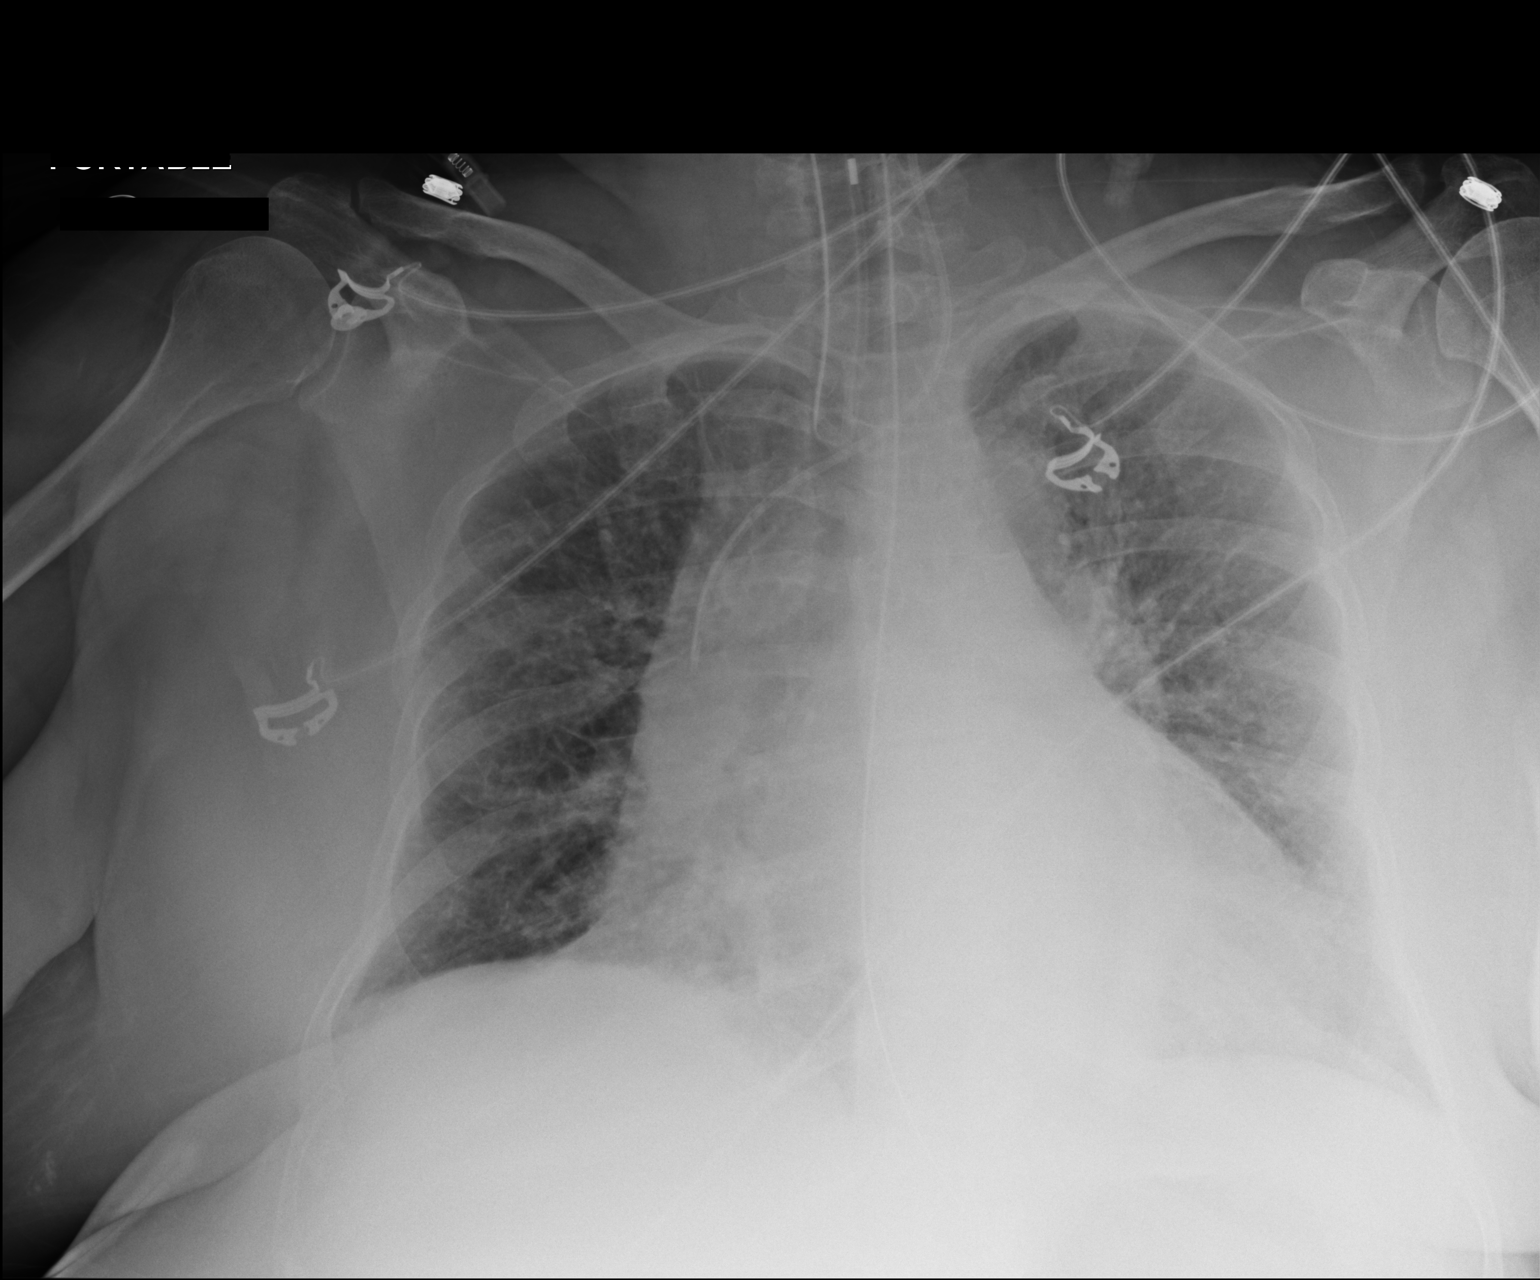

[1 of 1 positions shown; findings below may reference images not displayed]

FINDINGS: Endotracheal tube is in place with the tip in good position, 4.9 cm
above the carina. Left IJ central venous catheter tip projects over
the mid superior vena cava. There is no pneumothorax. Cardiomegaly
without edema is identified. No pleural effusion.
IMPRESSION: ETT and right IJ catheter projecting good position. No pneumothorax
or other new abnormality.

Cardiomegaly.

## 2017-01-21 IMAGING — CR DG CHEST 1V PORT
1 series · 1 of 1 positions shown · non-contrast
Comparison: December 04, 2015

CLINICAL DATA: Hypoxia

EXAM:
PORTABLE CHEST 1 VIEW

[AP]
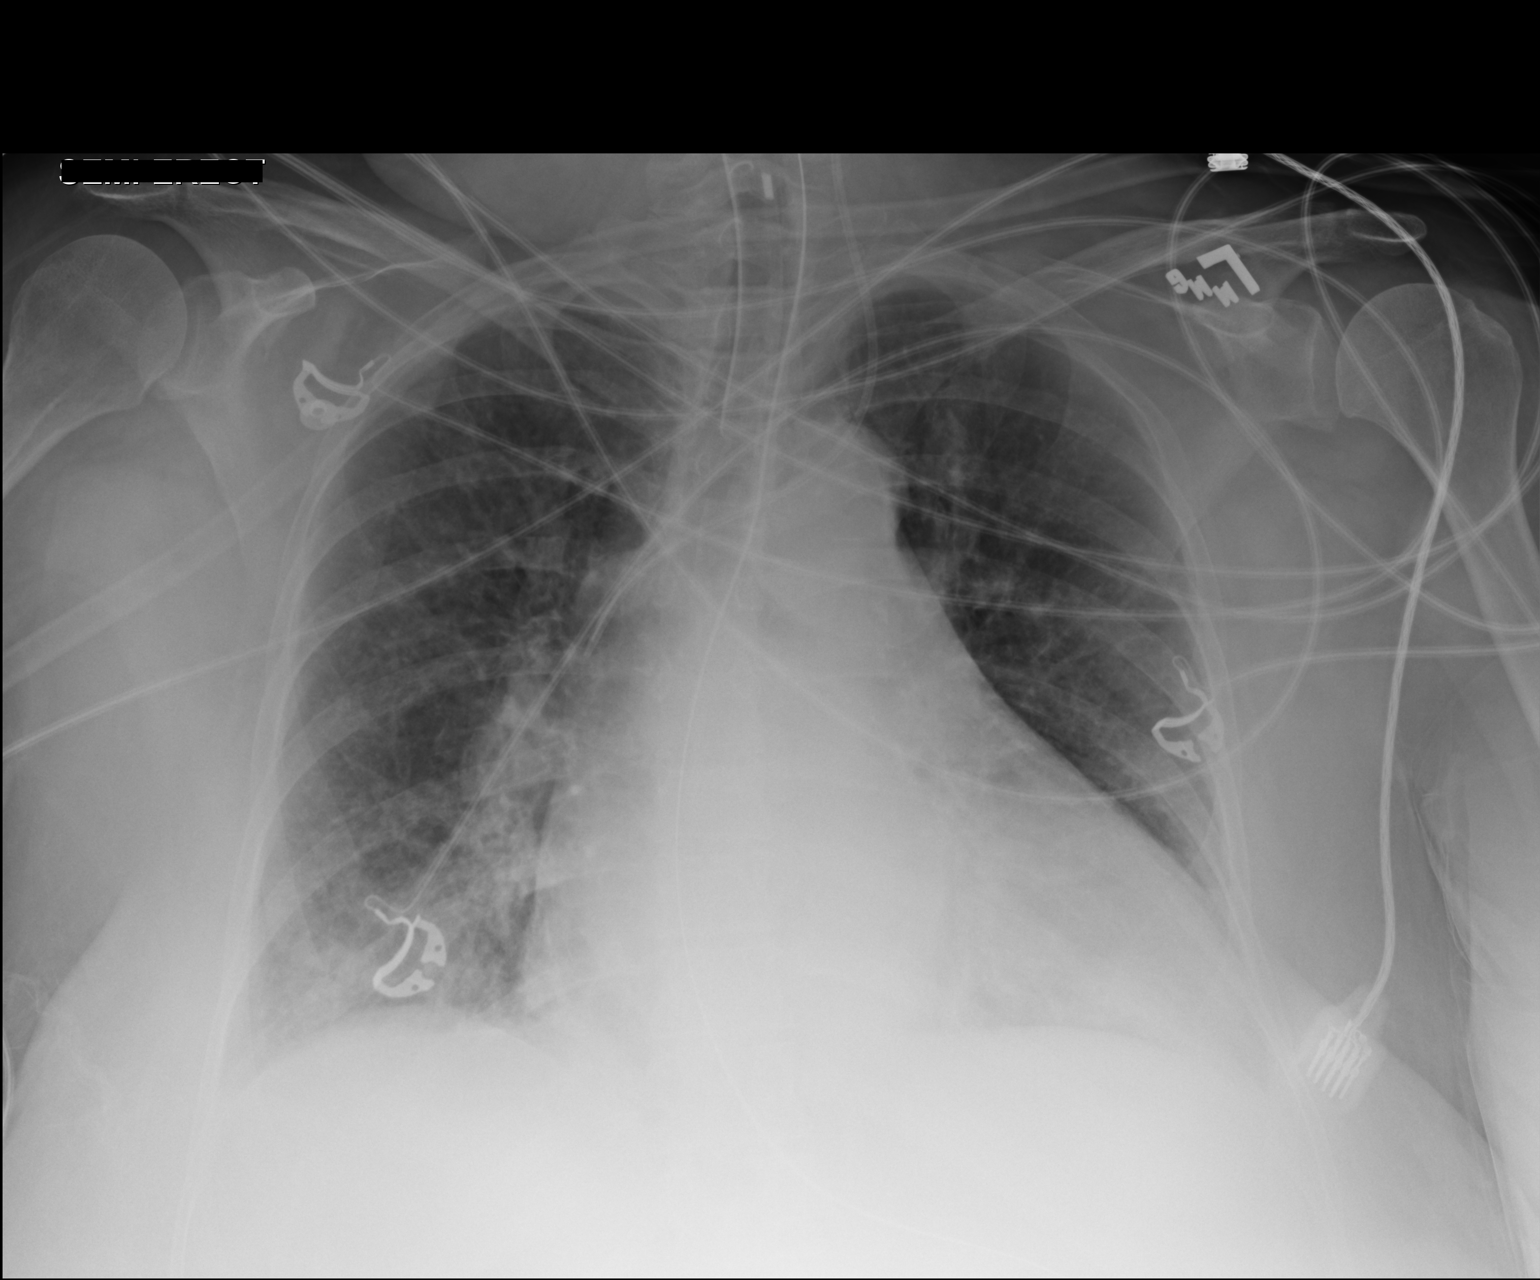

[1 of 1 positions shown; findings below may reference images not displayed]

FINDINGS: Endotracheal tube tip is 3.6 cm above the carina. Central catheter
tip is in the superior vena cava. Nasogastric tube tip and side port
below the diaphragm. No pneumothorax. There is subtle patchy
infiltrate in the right base, new. Lungs elsewhere clear. Heart is
enlarged with pulmonary vascularity within normal limits, stable. No
adenopathy.
IMPRESSION: Subtle infiltrate right base. Lungs elsewhere clear. Tube and
catheter positions as described without pneumothorax. Stable
cardiomegaly.

## 2017-02-15 IMAGING — CR DG CHEST 1V PORT
1 series · 1 of 1 positions shown · non-contrast
Comparison: Radiograph 12/06/2015

CLINICAL DATA: Sob, wheezing  today

EXAM:
PORTABLE CHEST 1 VIEW

[AP]
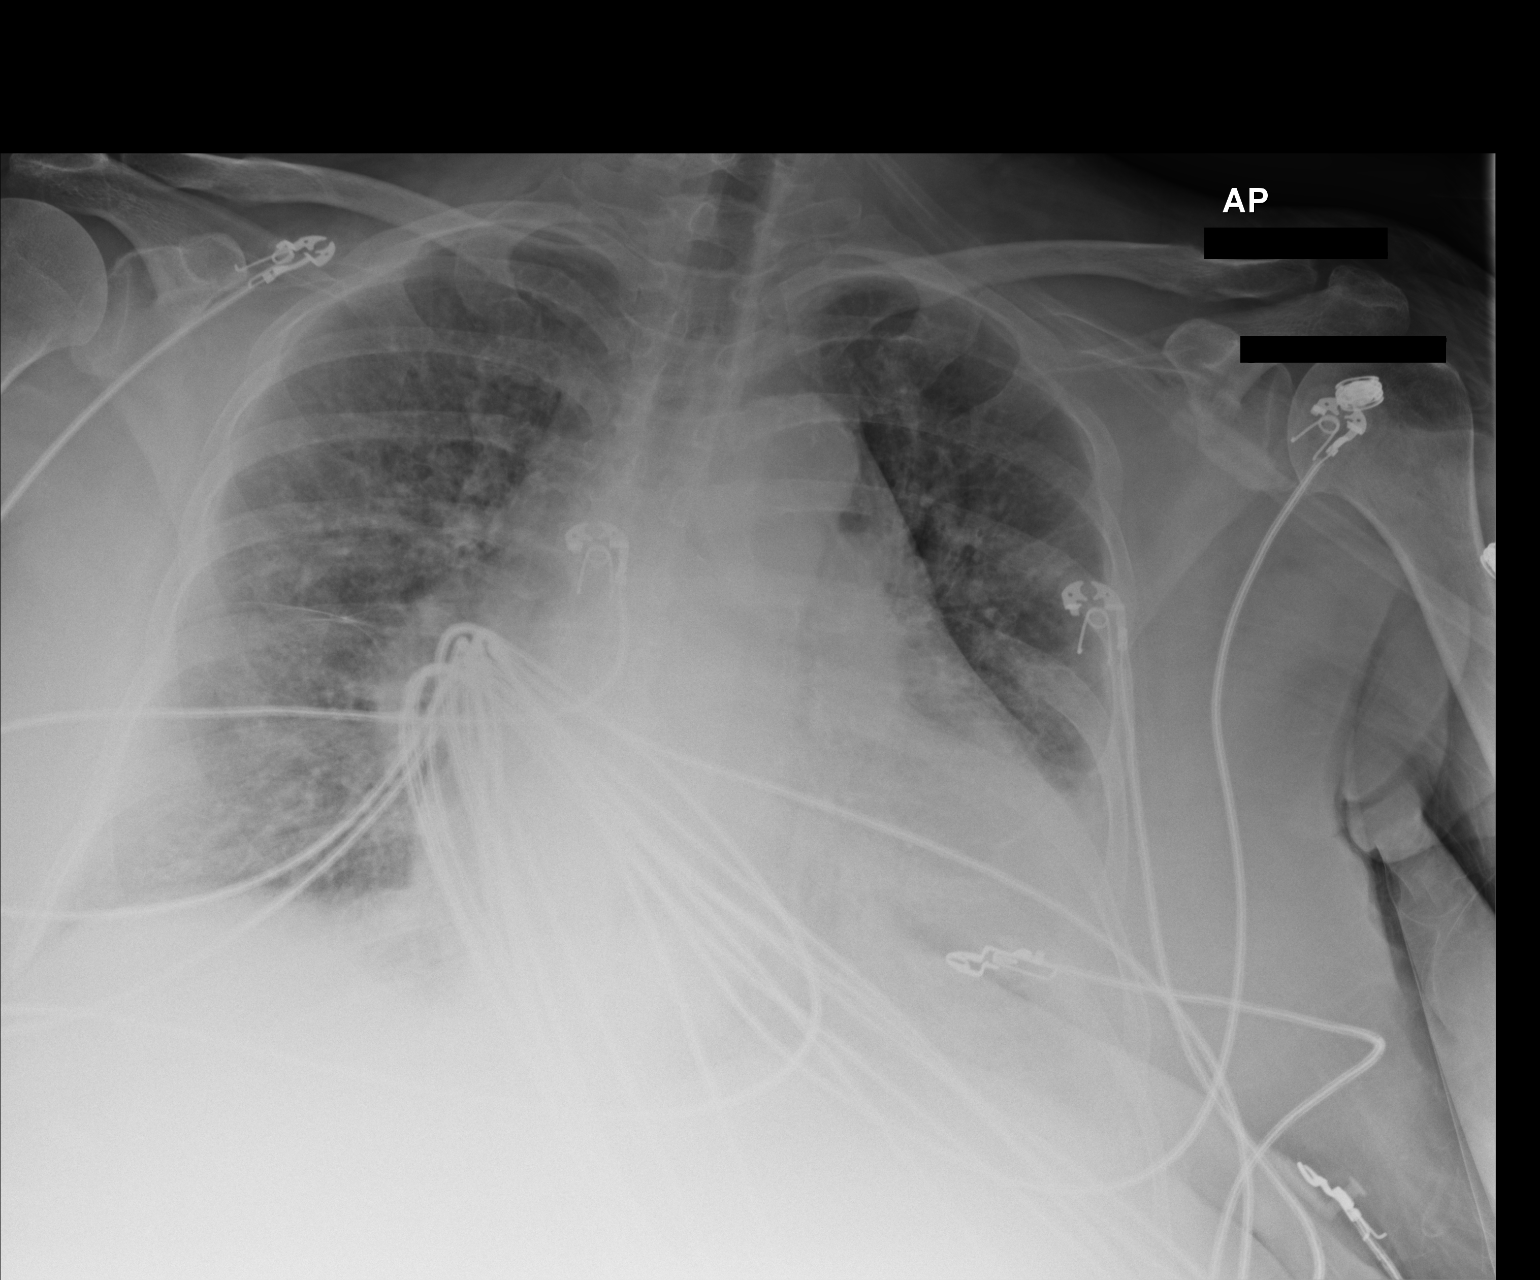

[1 of 1 positions shown; findings below may reference images not displayed]

FINDINGS: Stable enlarged cardiac silhouette. There are low lung volumes. Mild
interstitial edema pattern. No focal infiltrate. No pneumothorax.
IMPRESSION: Cardiomegaly and mild interstitial edema.
